# Patient Record
Sex: Female | Born: 1960
Health system: Southern US, Community
[De-identification: ages and names within clinical notes are randomized; demographics above are authoritative.]

## PROBLEM LIST (undated history)

## (undated) DIAGNOSIS — E559 Vitamin D deficiency, unspecified: Secondary | ICD-10-CM

## (undated) DIAGNOSIS — K649 Unspecified hemorrhoids: Secondary | ICD-10-CM

## (undated) DIAGNOSIS — M722 Plantar fascial fibromatosis: Secondary | ICD-10-CM

## (undated) DIAGNOSIS — L719 Rosacea, unspecified: Secondary | ICD-10-CM

## (undated) DIAGNOSIS — F329 Major depressive disorder, single episode, unspecified: Secondary | ICD-10-CM

## (undated) DIAGNOSIS — I77819 Aortic ectasia, unspecified site: Secondary | ICD-10-CM

## (undated) DIAGNOSIS — Z8719 Personal history of other diseases of the digestive system: Secondary | ICD-10-CM

## (undated) DIAGNOSIS — D6851 Activated protein C resistance: Principal | ICD-10-CM

## (undated) DIAGNOSIS — K760 Fatty (change of) liver, not elsewhere classified: Secondary | ICD-10-CM

## (undated) DIAGNOSIS — R51 Headache: Secondary | ICD-10-CM

## (undated) DIAGNOSIS — IMO0002 Reserved for concepts with insufficient information to code with codable children: Secondary | ICD-10-CM

## (undated) DIAGNOSIS — T8859XA Other complications of anesthesia, initial encounter: Secondary | ICD-10-CM

## (undated) DIAGNOSIS — K219 Gastro-esophageal reflux disease without esophagitis: Secondary | ICD-10-CM

## (undated) DIAGNOSIS — R519 Headache, unspecified: Secondary | ICD-10-CM

## (undated) DIAGNOSIS — Z923 Personal history of irradiation: Secondary | ICD-10-CM

## (undated) DIAGNOSIS — Z9889 Other specified postprocedural states: Secondary | ICD-10-CM

## (undated) DIAGNOSIS — F32A Depression, unspecified: Secondary | ICD-10-CM

## (undated) DIAGNOSIS — M8580 Other specified disorders of bone density and structure, unspecified site: Secondary | ICD-10-CM

## (undated) DIAGNOSIS — G4730 Sleep apnea, unspecified: Secondary | ICD-10-CM

## (undated) DIAGNOSIS — R5383 Other fatigue: Secondary | ICD-10-CM

## (undated) DIAGNOSIS — F419 Anxiety disorder, unspecified: Secondary | ICD-10-CM

## (undated) DIAGNOSIS — R112 Nausea with vomiting, unspecified: Secondary | ICD-10-CM

## (undated) DIAGNOSIS — I10 Essential (primary) hypertension: Secondary | ICD-10-CM

## (undated) DIAGNOSIS — C50919 Malignant neoplasm of unspecified site of unspecified female breast: Secondary | ICD-10-CM

## (undated) DIAGNOSIS — K5900 Constipation, unspecified: Secondary | ICD-10-CM

## (undated) DIAGNOSIS — E039 Hypothyroidism, unspecified: Secondary | ICD-10-CM

## (undated) DIAGNOSIS — T4145XA Adverse effect of unspecified anesthetic, initial encounter: Secondary | ICD-10-CM

## (undated) HISTORY — DX: Vitamin D deficiency, unspecified: E55.9

## (undated) HISTORY — PX: DIAGNOSTIC LAPAROSCOPY: SUR761

## (undated) HISTORY — DX: Rosacea, unspecified: L71.9

## (undated) HISTORY — DX: Fatty (change of) liver, not elsewhere classified: K76.0

## (undated) HISTORY — DX: Unspecified hemorrhoids: K64.9

## (undated) HISTORY — DX: Plantar fascial fibromatosis: M72.2

## (undated) HISTORY — PX: GANGLION CYST EXCISION: SHX1691

## (undated) HISTORY — DX: Gastro-esophageal reflux disease without esophagitis: K21.9

## (undated) HISTORY — DX: Reserved for concepts with insufficient information to code with codable children: IMO0002

## (undated) HISTORY — DX: Depression, unspecified: F32.A

## (undated) HISTORY — DX: Aortic ectasia, unspecified site: I77.819

## (undated) HISTORY — PX: INGUINAL HERNIA REPAIR: SHX194

## (undated) HISTORY — DX: Anxiety disorder, unspecified: F41.9

## (undated) HISTORY — DX: Other fatigue: R53.83

## (undated) HISTORY — DX: Sleep apnea, unspecified: G47.30

## (undated) HISTORY — DX: Constipation, unspecified: K59.00

## (undated) HISTORY — DX: Other specified disorders of bone density and structure, unspecified site: M85.80

## (undated) HISTORY — DX: Activated protein C resistance: D68.51

---
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
## 1988-11-21 HISTORY — PX: SINUS EXPLORATION: SHX5214

## 2001-10-03 ENCOUNTER — Encounter: Admission: RE | Admit: 2001-10-03 | Discharge: 2001-10-03 | Payer: Self-pay | Admitting: General Surgery

## 2001-10-03 ENCOUNTER — Encounter: Payer: Self-pay | Admitting: General Surgery

## 2002-02-15 ENCOUNTER — Encounter: Admission: RE | Admit: 2002-02-15 | Discharge: 2002-02-15 | Payer: Self-pay | Admitting: *Deleted

## 2002-02-15 ENCOUNTER — Encounter: Payer: Self-pay | Admitting: *Deleted

## 2002-08-19 ENCOUNTER — Encounter: Payer: Self-pay | Admitting: Family Medicine

## 2002-08-19 ENCOUNTER — Encounter: Admission: RE | Admit: 2002-08-19 | Discharge: 2002-08-19 | Payer: Self-pay | Admitting: Family Medicine

## 2002-08-28 ENCOUNTER — Encounter: Admission: RE | Admit: 2002-08-28 | Discharge: 2002-08-28 | Payer: Self-pay | Admitting: Family Medicine

## 2002-08-28 ENCOUNTER — Encounter: Payer: Self-pay | Admitting: Family Medicine

## 2002-09-24 ENCOUNTER — Other Ambulatory Visit: Admission: RE | Admit: 2002-09-24 | Discharge: 2002-09-24 | Payer: Self-pay | Admitting: Obstetrics and Gynecology

## 2003-01-10 ENCOUNTER — Encounter: Admission: RE | Admit: 2003-01-10 | Discharge: 2003-01-10 | Payer: Self-pay | Admitting: Family Medicine

## 2003-01-10 ENCOUNTER — Encounter: Payer: Self-pay | Admitting: Family Medicine

## 2003-09-18 ENCOUNTER — Encounter: Admission: RE | Admit: 2003-09-18 | Discharge: 2003-09-18 | Payer: Self-pay | Admitting: Obstetrics and Gynecology

## 2003-10-31 ENCOUNTER — Other Ambulatory Visit: Admission: RE | Admit: 2003-10-31 | Discharge: 2003-10-31 | Payer: Self-pay | Admitting: Obstetrics and Gynecology

## 2004-05-13 ENCOUNTER — Ambulatory Visit (HOSPITAL_COMMUNITY): Admission: RE | Admit: 2004-05-13 | Discharge: 2004-05-13 | Payer: Self-pay | Admitting: Gastroenterology

## 2004-08-29 ENCOUNTER — Emergency Department (HOSPITAL_COMMUNITY): Admission: EM | Admit: 2004-08-29 | Discharge: 2004-08-29 | Payer: Self-pay | Admitting: Family Medicine

## 2004-09-22 ENCOUNTER — Encounter: Admission: RE | Admit: 2004-09-22 | Discharge: 2004-09-22 | Payer: Self-pay | Admitting: Obstetrics and Gynecology

## 2004-09-29 ENCOUNTER — Encounter: Admission: RE | Admit: 2004-09-29 | Discharge: 2004-09-29 | Payer: Self-pay | Admitting: Obstetrics and Gynecology

## 2004-12-27 ENCOUNTER — Ambulatory Visit: Payer: Self-pay | Admitting: Internal Medicine

## 2005-01-14 ENCOUNTER — Ambulatory Visit: Payer: Self-pay

## 2005-11-23 ENCOUNTER — Encounter: Admission: RE | Admit: 2005-11-23 | Discharge: 2005-11-23 | Payer: Self-pay | Admitting: Obstetrics and Gynecology

## 2005-12-30 ENCOUNTER — Encounter: Admission: RE | Admit: 2005-12-30 | Discharge: 2005-12-30 | Payer: Self-pay | Admitting: Family Medicine

## 2006-03-10 ENCOUNTER — Ambulatory Visit: Payer: Self-pay | Admitting: Oncology

## 2006-05-01 ENCOUNTER — Ambulatory Visit: Payer: Self-pay | Admitting: Oncology

## 2006-05-02 LAB — BASIC METABOLIC PANEL - CANCER CENTER ONLY
BUN, Bld: 12 mg/dL (ref 7–22)
Calcium: 9.6 mg/dL (ref 8.0–10.3)
Glucose, Bld: 99 mg/dL (ref 73–118)

## 2006-08-27 ENCOUNTER — Encounter: Admission: RE | Admit: 2006-08-27 | Discharge: 2006-08-27 | Payer: Self-pay | Admitting: Family Medicine

## 2006-11-24 ENCOUNTER — Encounter: Admission: RE | Admit: 2006-11-24 | Discharge: 2006-11-24 | Payer: Self-pay | Admitting: Obstetrics and Gynecology

## 2007-11-02 ENCOUNTER — Emergency Department: Payer: Self-pay | Admitting: Emergency Medicine

## 2007-12-10 ENCOUNTER — Encounter: Admission: RE | Admit: 2007-12-10 | Discharge: 2007-12-10 | Payer: Self-pay | Admitting: Obstetrics and Gynecology

## 2008-03-26 ENCOUNTER — Encounter: Admission: RE | Admit: 2008-03-26 | Discharge: 2008-03-26 | Payer: Self-pay | Admitting: Gastroenterology

## 2008-12-10 ENCOUNTER — Encounter: Admission: RE | Admit: 2008-12-10 | Discharge: 2008-12-10 | Payer: Self-pay | Admitting: Obstetrics and Gynecology

## 2008-12-15 ENCOUNTER — Encounter: Admission: RE | Admit: 2008-12-15 | Discharge: 2008-12-15 | Payer: Self-pay | Admitting: Obstetrics and Gynecology

## 2009-06-17 ENCOUNTER — Encounter: Admission: RE | Admit: 2009-06-17 | Discharge: 2009-06-17 | Payer: Self-pay | Admitting: Obstetrics and Gynecology

## 2009-11-12 ENCOUNTER — Encounter: Admission: RE | Admit: 2009-11-12 | Discharge: 2009-11-12 | Payer: Self-pay | Admitting: Obstetrics and Gynecology

## 2009-12-15 ENCOUNTER — Encounter: Admission: RE | Admit: 2009-12-15 | Discharge: 2009-12-15 | Payer: Self-pay | Admitting: Obstetrics and Gynecology

## 2010-06-14 ENCOUNTER — Encounter: Admission: RE | Admit: 2010-06-14 | Discharge: 2010-06-14 | Payer: Self-pay | Admitting: Obstetrics and Gynecology

## 2010-12-12 ENCOUNTER — Encounter: Payer: Self-pay | Admitting: Obstetrics and Gynecology

## 2010-12-16 ENCOUNTER — Encounter
Admission: RE | Admit: 2010-12-16 | Discharge: 2010-12-16 | Payer: Self-pay | Source: Home / Self Care | Attending: Obstetrics and Gynecology | Admitting: Obstetrics and Gynecology

## 2011-04-04 ENCOUNTER — Other Ambulatory Visit: Payer: Self-pay | Admitting: Obstetrics and Gynecology

## 2011-04-04 ENCOUNTER — Ambulatory Visit
Admission: RE | Admit: 2011-04-04 | Discharge: 2011-04-04 | Disposition: A | Discharge status: Home / Self Care | Payer: BC Managed Care – PPO | Source: Ambulatory Visit | Attending: Obstetrics and Gynecology | Admitting: Obstetrics and Gynecology

## 2011-04-04 DIAGNOSIS — R1031 Right lower quadrant pain: Secondary | ICD-10-CM

## 2011-04-15 ENCOUNTER — Ambulatory Visit (HOSPITAL_COMMUNITY)
Admission: RE | Admit: 2011-04-15 | Discharge: 2011-04-15 | Disposition: A | Discharge status: Home / Self Care | Payer: BC Managed Care – PPO | Source: Ambulatory Visit | Attending: Urology | Admitting: Urology

## 2011-04-15 ENCOUNTER — Ambulatory Visit (HOSPITAL_BASED_OUTPATIENT_CLINIC_OR_DEPARTMENT_OTHER)
Admission: RE | Admit: 2011-04-15 | Discharge: 2011-04-15 | Disposition: A | Discharge status: Home / Self Care | Payer: BC Managed Care – PPO | Source: Ambulatory Visit | Attending: Urology | Admitting: Urology

## 2011-04-15 DIAGNOSIS — Z01812 Encounter for preprocedural laboratory examination: Secondary | ICD-10-CM | POA: Insufficient documentation

## 2011-04-15 DIAGNOSIS — N201 Calculus of ureter: Secondary | ICD-10-CM | POA: Insufficient documentation

## 2011-04-15 DIAGNOSIS — N200 Calculus of kidney: Secondary | ICD-10-CM | POA: Insufficient documentation

## 2011-04-15 DIAGNOSIS — D6859 Other primary thrombophilia: Secondary | ICD-10-CM | POA: Insufficient documentation

## 2011-04-15 LAB — CBC
Hemoglobin: 13.7 g/dL (ref 12.0–15.0)
MCHC: 33.7 g/dL (ref 30.0–36.0)
MCV: 90.8 fL (ref 78.0–100.0)
RBC: 4.47 MIL/uL (ref 3.87–5.11)
RDW: 12.3 % (ref 11.5–15.5)
WBC: 6.8 10*3/uL (ref 4.0–10.5)

## 2011-04-22 NOTE — Op Note (Signed)
  NAMECHARMAYNE, Yvonne Wyatt               ACCOUNT NO.:  [id number]  MEDICAL RECORD NO.:  [id number]           PATIENT TYPE:  O  LOCATION:  XRAY                         FACILITY:  [facility]  PHYSICIAN:  Royalty Domagala I. Patsi Sears, M.D.DATE OF BIRTH:  1961/02/27  DATE OF PROCEDURE:  04/15/2011 DATE OF DISCHARGE:                              OPERATIVE REPORT   PREOPERATIVE DIAGNOSIS:  Impacted left lower ureteral calculus.  POSTOPERATIVE DIAGNOSIS:  Impacted left lower ureteral calculus.  OPERATION:  Cystourethroscopy, left retrograde pyelogram with interpretation, basket extraction of left lower ureteral calculus, left double-J stent (4.8 x 24 cm).  SURGEON:  Kailene Steinhart I. Patsi Sears, MD  ANESTHESIA:  General LMA.  PREOPERATIVE PREPARATION:  After appropriate preanesthesia, the patient was brought to the operating room, placed on the operating table in dorsal supine position where general LMA anesthesia was introduced.  B and O suppository was placed.  She was re-placed in dorsal lithotomy position where the pubis was prepped with Betadine solution and draped in usual fashion.  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  She has factor V Leiden abnormality.  The patient is now for extraction of her stone.  PROCEDURE IN DETAIL:  Cystourethroscopy was accomplished, left retrograde pyelogram was accomplished, and the stone was identified in left lower ureter.  Magnified images of the stone showed that the stone was rectangular in shape, measuring greater than 1 cm in length, x 5 mm in width.  The stone was also speculated.  I was able to place a 4-wire basket around stone through the ureteroscope, was able to engage the entire stone, and manipulate the stone through the edematous wall of the left ureter.  Minimal bleeding was noted.  However, because of the  edema of the wall secondary to impacted stone, I elected to place a left double-J stent.  Therefore, over the guidewire, a 4.8 x 24 cm left double-J stent was placed, with the suture left in place, for the patient to remove herself.  She was given IV Toradol, awakened, and taken to recovery room in excellent condition.     Kenzie Flakes I. Patsi Sears, M.D.     SIT/MEDQ  D:  04/15/2011  T:  04/16/2011  Job:  [id number]  cc:   Maxie Better, M.D. Fax: [phone]  Kari Baars, M.D. Fax: [phone]  Electronically Signed by Jethro Bolus M.D. on 04/22/2011 01:23:53 PM

## 2011-11-01 ENCOUNTER — Other Ambulatory Visit: Payer: Self-pay | Admitting: Obstetrics and Gynecology

## 2011-11-01 DIAGNOSIS — Z1231 Encounter for screening mammogram for malignant neoplasm of breast: Secondary | ICD-10-CM

## 2011-12-19 ENCOUNTER — Ambulatory Visit
Admission: RE | Admit: 2011-12-19 | Discharge: 2011-12-19 | Disposition: A | Discharge status: Home / Self Care | Payer: BC Managed Care – PPO | Source: Ambulatory Visit | Attending: Obstetrics and Gynecology | Admitting: Obstetrics and Gynecology

## 2011-12-19 DIAGNOSIS — Z1231 Encounter for screening mammogram for malignant neoplasm of breast: Secondary | ICD-10-CM

## 2012-11-16 ENCOUNTER — Other Ambulatory Visit: Payer: Self-pay | Admitting: Obstetrics and Gynecology

## 2012-11-16 DIAGNOSIS — Z1231 Encounter for screening mammogram for malignant neoplasm of breast: Secondary | ICD-10-CM

## 2012-12-19 ENCOUNTER — Ambulatory Visit: Payer: BC Managed Care – PPO

## 2013-01-04 ENCOUNTER — Ambulatory Visit: Payer: BC Managed Care – PPO

## 2013-01-17 ENCOUNTER — Ambulatory Visit: Payer: BC Managed Care – PPO

## 2013-02-08 ENCOUNTER — Ambulatory Visit
Admission: RE | Admit: 2013-02-08 | Discharge: 2013-02-08 | Disposition: A | Discharge status: Home / Self Care | Payer: BC Managed Care – PPO | Source: Ambulatory Visit | Attending: Obstetrics and Gynecology | Admitting: Obstetrics and Gynecology

## 2013-02-08 DIAGNOSIS — Z1231 Encounter for screening mammogram for malignant neoplasm of breast: Secondary | ICD-10-CM

## 2013-02-13 ENCOUNTER — Other Ambulatory Visit: Payer: Self-pay | Admitting: Obstetrics and Gynecology

## 2013-02-13 DIAGNOSIS — R928 Other abnormal and inconclusive findings on diagnostic imaging of breast: Secondary | ICD-10-CM

## 2013-02-19 ENCOUNTER — Ambulatory Visit
Admission: RE | Admit: 2013-02-19 | Discharge: 2013-02-19 | Disposition: A | Discharge status: Home / Self Care | Payer: BC Managed Care – PPO | Source: Ambulatory Visit | Attending: Obstetrics and Gynecology | Admitting: Obstetrics and Gynecology

## 2013-02-19 DIAGNOSIS — R928 Other abnormal and inconclusive findings on diagnostic imaging of breast: Secondary | ICD-10-CM

## 2013-07-25 ENCOUNTER — Other Ambulatory Visit: Payer: Self-pay | Admitting: Obstetrics and Gynecology

## 2013-07-25 DIAGNOSIS — N6001 Solitary cyst of right breast: Secondary | ICD-10-CM

## 2013-08-28 ENCOUNTER — Other Ambulatory Visit: Payer: BC Managed Care – PPO

## 2013-09-05 ENCOUNTER — Ambulatory Visit
Admission: RE | Admit: 2013-09-05 | Discharge: 2013-09-05 | Disposition: A | Discharge status: Home / Self Care | Payer: BC Managed Care – PPO | Source: Ambulatory Visit | Attending: Obstetrics and Gynecology | Admitting: Obstetrics and Gynecology

## 2013-09-05 DIAGNOSIS — N6001 Solitary cyst of right breast: Secondary | ICD-10-CM

## 2014-01-28 ENCOUNTER — Other Ambulatory Visit: Payer: Self-pay | Admitting: Obstetrics and Gynecology

## 2014-01-28 DIAGNOSIS — N6310 Unspecified lump in the right breast, unspecified quadrant: Secondary | ICD-10-CM

## 2014-02-24 ENCOUNTER — Ambulatory Visit
Admission: RE | Admit: 2014-02-24 | Discharge: 2014-02-24 | Disposition: A | Discharge status: Home / Self Care | Payer: Self-pay | Source: Ambulatory Visit | Attending: Obstetrics and Gynecology | Admitting: Obstetrics and Gynecology

## 2014-02-24 ENCOUNTER — Ambulatory Visit
Admission: RE | Admit: 2014-02-24 | Discharge: 2014-02-24 | Disposition: A | Discharge status: Home / Self Care | Payer: BC Managed Care – PPO | Source: Ambulatory Visit | Attending: Obstetrics and Gynecology | Admitting: Obstetrics and Gynecology

## 2014-02-24 DIAGNOSIS — N6310 Unspecified lump in the right breast, unspecified quadrant: Secondary | ICD-10-CM

## 2014-03-31 ENCOUNTER — Other Ambulatory Visit: Payer: Self-pay | Admitting: Obstetrics and Gynecology

## 2014-03-31 DIAGNOSIS — Z780 Asymptomatic menopausal state: Secondary | ICD-10-CM

## 2014-04-21 ENCOUNTER — Ambulatory Visit
Admission: RE | Admit: 2014-04-21 | Discharge: 2014-04-21 | Disposition: A | Discharge status: Home / Self Care | Payer: BC Managed Care – PPO | Source: Ambulatory Visit | Attending: Obstetrics and Gynecology | Admitting: Obstetrics and Gynecology

## 2014-04-21 ENCOUNTER — Encounter (INDEPENDENT_AMBULATORY_CARE_PROVIDER_SITE_OTHER): Payer: Self-pay

## 2014-04-21 DIAGNOSIS — Z780 Asymptomatic menopausal state: Secondary | ICD-10-CM

## 2014-05-20 ENCOUNTER — Ambulatory Visit (INDEPENDENT_AMBULATORY_CARE_PROVIDER_SITE_OTHER): Payer: BC Managed Care – PPO | Admitting: Oncology

## 2014-05-20 ENCOUNTER — Encounter: Payer: Self-pay | Admitting: Oncology

## 2014-05-20 VITALS — BP 131/83 | HR 93 | Temp 97.8°F | Ht 66.0 in | Wt 182.5 lb

## 2014-05-20 DIAGNOSIS — IMO0002 Reserved for concepts with insufficient information to code with codable children: Secondary | ICD-10-CM

## 2014-05-20 DIAGNOSIS — D6859 Other primary thrombophilia: Secondary | ICD-10-CM

## 2014-05-20 DIAGNOSIS — D6851 Activated protein C resistance: Secondary | ICD-10-CM

## 2014-05-20 HISTORY — DX: Reserved for concepts with insufficient information to code with codable children: IMO0002

## 2014-05-20 HISTORY — DX: Activated protein C resistance: D68.51

## 2014-05-20 NOTE — Progress Notes (Signed)
Patient ID: Yvonne Wyatt, female   DOB: January 31, 1961, 53 y.o.   MRN: [id number] New Patient Hematology   Yvonne Wyatt [id number] 1961/05/24 53 y.o. 05/20/2014  CC: Dr. 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 She was found to be a heterozygote for the factor 5 Leiden gene mutation in 2007. She has no personal history of blood clots. She had been on estrogen containing oral contraceptives with 35 mcg of estrogen for many years to control symptoms of endometriosis. She's had a number of surgeries including tonsillectomy at age 53, laparotomy in 1981, C-section in 1988, a number of laser endometriosis lysis of adhesions procedure in 1991 and 1993, and surgery on her right wrist to excise multiple cysts. She's never had any perioperative complications. She had an uncomplicated pregnancy. Her son who is her only child, is 9 years old. He has not had any clotting problems. Her mother who was a heavy smoker and was also on oral contraceptives had a venous thrombosis in her leg at approximate age 79. Her father died of complications of obstructive airway disease. Brother died of complications of a stroke at age 46 but had poorly controlled diabetes and hypertension. He tested negative for the factor V Leiden gene defect. A sister age 46 and another sister age 36 had tested negative as well.  She went into an early menopause at age 53. She has developed significant atrophy of the vaginal tissues and has significant dyspareunia such that she has not had intercourse in many years. She was told by a mother hematologist that she should not take any estrogen preparations.   PMH: Endometriosis. No hypertension, diabetes, ulcers, she does have reflux, no thyroid disease, no history of hepatitis, yellow jaundice, she  had mononucleosis in high school, if I recall correctly Past surgery: See history of present illness  Allergies: Allergies  Allergen Reactions  . Codeine Rash    Medications: Scheduled Meds: None  PRN Meds:.Not recorded    Social History: Married, one healthy son age 32, works as a Marine scientist in a [employer]  reports that she has never smoked. Occasional alcohol, social.   Family History: See history of present illness  Review of Systems: Unremarkable. No dyspnea, chest pain, chest pressure, leg swelling or tenderness.  Physical Exam: Blood pressure 131/83, pulse 93, temperature 97.8 F (36.6 C), temperature source Oral, height 5\' 6"  (1.676 m), weight 182 lb 8 oz (82.781 kg), SpO2 98.00%. Wt Readings from Last 3 Encounters:  05/20/14 182 lb 8 oz (82.781 kg)     General appearance: Well-nourished Caucasian woman HENNT: Pharynx no erythema, exudate, mass, or ulcer. No thyromegaly or thyroid nodules Lymph nodes: No cervical, supraclavicular, or axillary lymphadenopathy Breasts: Lungs: Clear to auscultation, resonant to percussion throughout Heart: Regular rhythm, no murmur, no gallop, no rub, no click, no edema Abdomen: Soft, nontender, normal bowel sounds, no mass, no organomegaly Extremities: No edema, no calf tenderness. Right calf 42 cm, left 42.5. Right ankle 22 cm, left 22.5. Musculoskeletal: no joint deformities GU:  Vascular: Carotid pulses 2+, no bruits, distal pulses: Dorsalis pedis 1+ symmetric Neurologic: Alert, oriented, PERRLA, optic discs sharp and vessels normal, no hemorrhage or exudate, cranial nerves grossly normal, motor strength 5 over 5, reflexes 1+ symmetric, upper body coordination normal, gait normal, Skin: No rash or ecchymosis  Lab Results: Lab Results  Component Value Date   WBC 6.8 04/15/2011   HGB 13.7 04/15/2011   HCT 40.6 04/15/2011   MCV 90.8 04/15/2011   PLT 253 04/15/2011     Chemistry      Component Value Date/Time   NA 140  05/02/2006 1027   K 4.0 05/02/2006 1027   CL 104 05/02/2006 1027   CO2 22 05/02/2006 1027   BUN 12 05/02/2006 1027   CREATININE 0.9 05/02/2006 1027      Component Value Date/Time   CALCIUM 9.6 05/02/2006 1027       Impression and Plan: Heterozygote status for factor V Leiden gene mutation  She has had a number of physiologic stresses on her clotting system including previous prolonged use of estrogen containing oral contraceptives, pregnancy, and multiple surgical procedures, and has never had a thrombotic event. Family history positive for only a single person, her mother, having a lower extremity DVT while on oral contraceptives at age 42 with additional risk factor of heavy cigarette smoking. Her brother's event was arterial, and unrelated to a congenital coagulopathy and therefore not a risk factor for Yvonne Wyatt.  I think that Yvonne Wyatt would be at very low risk for thrombosis if she were to use low doses of estrogen. Given the prevalence of this gene mutation in the general population, there are many women who have the mutation who are taking estrogen containing medication and who are not having thrombotic events. She, clearly,was one of them. If she can control her local problems with estrogen vaginal cream alone this would be my first choice. A Mirena or Skylar vaginal ring would pose minimal to 0 thrombotic risk but I am not sure whether the progesterone only effect could reverse her vaginal atrophy. A combination of topical vaginal estrogen cream and oral low dose progesterone (for example Megace 20 mg twice a day) could control her local problem as well as hot flashes. If none of the above options worked, I would not have any objection to her being on a low-dose estrogen/progesterone containing pill such as Loestrin which only has 10 mcg of estrogen in combination with norethindrone.        Annia Belt, MD 05/20/2014, 4:21 PM

## 2014-05-20 NOTE — Patient Instructions (Signed)
OK to start with a topical vaginal estrogen containing cream Can supplement with low dose megace 20 mg twice daily for severe hot flashes I feel a combo pill with 10 micrograms of estrogen plus low dose progesterone would be acceptable and have low clotting risk

## 2015-01-27 ENCOUNTER — Other Ambulatory Visit: Payer: Self-pay | Admitting: Obstetrics and Gynecology

## 2015-01-27 DIAGNOSIS — N63 Unspecified lump in unspecified breast: Secondary | ICD-10-CM

## 2015-02-26 ENCOUNTER — Other Ambulatory Visit: Payer: Self-pay

## 2015-03-04 ENCOUNTER — Other Ambulatory Visit: Payer: Self-pay | Admitting: Obstetrics and Gynecology

## 2015-03-04 ENCOUNTER — Ambulatory Visit
Admission: RE | Admit: 2015-03-04 | Discharge: 2015-03-04 | Disposition: A | Discharge status: Home / Self Care | Payer: BLUE CROSS/BLUE SHIELD | Source: Ambulatory Visit | Attending: Obstetrics and Gynecology | Admitting: Obstetrics and Gynecology

## 2015-03-04 DIAGNOSIS — N63 Unspecified lump in unspecified breast: Secondary | ICD-10-CM

## 2016-01-01 ENCOUNTER — Other Ambulatory Visit: Payer: Self-pay

## 2016-01-01 DIAGNOSIS — Z1231 Encounter for screening mammogram for malignant neoplasm of breast: Secondary | ICD-10-CM

## 2016-03-07 ENCOUNTER — Other Ambulatory Visit: Payer: Self-pay | Admitting: Obstetrics and Gynecology

## 2016-03-07 DIAGNOSIS — M8580 Other specified disorders of bone density and structure, unspecified site: Secondary | ICD-10-CM

## 2016-03-08 ENCOUNTER — Ambulatory Visit
Admission: RE | Admit: 2016-03-08 | Discharge: 2016-03-08 | Disposition: A | Discharge status: Home / Self Care | Payer: 59 | Source: Ambulatory Visit

## 2016-03-08 DIAGNOSIS — Z1231 Encounter for screening mammogram for malignant neoplasm of breast: Secondary | ICD-10-CM

## 2016-03-17 ENCOUNTER — Other Ambulatory Visit: Payer: Self-pay | Admitting: Gastroenterology

## 2016-03-17 DIAGNOSIS — R1013 Epigastric pain: Secondary | ICD-10-CM

## 2016-03-17 DIAGNOSIS — R109 Unspecified abdominal pain: Secondary | ICD-10-CM

## 2016-03-25 ENCOUNTER — Other Ambulatory Visit: Payer: 59

## 2016-03-30 ENCOUNTER — Ambulatory Visit
Admission: RE | Admit: 2016-03-30 | Discharge: 2016-03-30 | Disposition: A | Discharge status: Home / Self Care | Payer: 59 | Source: Ambulatory Visit | Attending: Gastroenterology | Admitting: Gastroenterology

## 2016-03-30 DIAGNOSIS — R1013 Epigastric pain: Secondary | ICD-10-CM

## 2016-03-30 DIAGNOSIS — R109 Unspecified abdominal pain: Secondary | ICD-10-CM

## 2016-04-25 ENCOUNTER — Ambulatory Visit
Admission: RE | Admit: 2016-04-25 | Discharge: 2016-04-25 | Disposition: A | Discharge status: Home / Self Care | Payer: 59 | Source: Ambulatory Visit | Attending: Obstetrics and Gynecology | Admitting: Obstetrics and Gynecology

## 2016-04-25 DIAGNOSIS — M8580 Other specified disorders of bone density and structure, unspecified site: Secondary | ICD-10-CM

## 2016-11-24 DIAGNOSIS — Z719 Counseling, unspecified: Secondary | ICD-10-CM | POA: Diagnosis not present

## 2016-12-01 DIAGNOSIS — Z719 Counseling, unspecified: Secondary | ICD-10-CM | POA: Diagnosis not present

## 2016-12-08 DIAGNOSIS — Z719 Counseling, unspecified: Secondary | ICD-10-CM | POA: Diagnosis not present

## 2016-12-15 DIAGNOSIS — Z719 Counseling, unspecified: Secondary | ICD-10-CM | POA: Diagnosis not present

## 2017-01-05 DIAGNOSIS — Z719 Counseling, unspecified: Secondary | ICD-10-CM | POA: Diagnosis not present

## 2017-01-09 ENCOUNTER — Other Ambulatory Visit: Payer: Self-pay | Admitting: Obstetrics and Gynecology

## 2017-01-09 DIAGNOSIS — Z1231 Encounter for screening mammogram for malignant neoplasm of breast: Secondary | ICD-10-CM

## 2017-03-10 ENCOUNTER — Ambulatory Visit
Admission: RE | Admit: 2017-03-10 | Discharge: 2017-03-10 | Disposition: A | Discharge status: Home / Self Care | Payer: 59 | Source: Ambulatory Visit | Attending: Obstetrics and Gynecology | Admitting: Obstetrics and Gynecology

## 2017-03-10 DIAGNOSIS — Z1231 Encounter for screening mammogram for malignant neoplasm of breast: Secondary | ICD-10-CM

## 2017-04-19 DIAGNOSIS — Z01419 Encounter for gynecological examination (general) (routine) without abnormal findings: Secondary | ICD-10-CM | POA: Diagnosis not present

## 2017-07-18 ENCOUNTER — Encounter: Payer: Self-pay | Admitting: Podiatry

## 2017-07-18 ENCOUNTER — Ambulatory Visit (INDEPENDENT_AMBULATORY_CARE_PROVIDER_SITE_OTHER): Payer: 59 | Admitting: Podiatry

## 2017-07-18 ENCOUNTER — Ambulatory Visit (INDEPENDENT_AMBULATORY_CARE_PROVIDER_SITE_OTHER): Payer: 59

## 2017-07-18 ENCOUNTER — Ambulatory Visit: Payer: 59

## 2017-07-18 VITALS — BP 137/85 | HR 70

## 2017-07-18 DIAGNOSIS — R52 Pain, unspecified: Secondary | ICD-10-CM

## 2017-07-18 DIAGNOSIS — M2580 Other specified joint disorders, unspecified joint: Secondary | ICD-10-CM

## 2017-07-18 NOTE — Progress Notes (Signed)
   Subjective:    Patient ID: Yvonne Wyatt, female    DOB: Sep 25, 1961, 56 y.o.   MRN: [id number]  HPI This patient presents today with several concerns. Her primary concern is pain in the plantar right first MPJ for the past or weeks aggravated with standing and walking relieved with rest and elevation. Patient has prior history of sesamoiditis. She wears athletic style shoes which reduces some of the discomfort in this area Patient also complaining of bilateral arch pain for the past 2 months aggravated with standing walking relieved with rest and self treated with athletic style shoes. 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  Musculoskeletal: Positive for back pain.  Neurological: Positive for headaches.       Objective:   Physical Exam  Patient has some issues approximated 5 foot 6 210 pounds  Objective: Orientated 3 DP and PT pulses 2/4 bilaterally Reflexes within normal limits bilaterally  Neurological: Sensation to 10 g monofilament wire intact 5/5 bilaterally Vibratory sensation reactive bilaterally Ankle reflexes reactive bilaterally  Dermatological No skin lesions bilaterally Texture and turgor within normal limits bilaterally  Musculoskeletal: Palpable tenderness plantar medial sesamoid first MPJ right. This duplicates patient's discomfort. There is no tenderness on range of motion first MPJ right or first MPJ left Palpable tenderness it section medial plantar fascial bilaterally without any palpable lesions  X-ray examination weightbearing right foot dated 07/18/2017 Intact bony structures fracture and/or dislocation On one view margin of the medial sesamoid at decreased density. This was not noticed any other eview  Radiographic impression: Probable sesamoiditis medial sesamoid right first MPJ  X-ray examination weightbearing left foot dated  07/18/2017  Intact bony structures without fracture and/or dislocation  Radiographic impression: No acute bony abnormality noted in the x-ray of the left foot      Assessment & Plan:   Assessment: Sesamoiditis medial sesamoid plantar right first MPJ Fasciitis bilaterally  Plan: Today I reviewed the results exam and x-rays with patient. I offered her Kenalog Injection for the sesamoiditis and she verbally consents. Skin is prepped with alcohol and Betadine and 10 mg of Kenalog mixed with 10 mg of plain Xylocaine and 2.5 mg of plain Sensorcaine were injected in or around the plantar medial sesamoid on the right foot. Patient tolerated procedure without any difficulty Surgical felt pads dispensed with cut outs 3 for patient to place inside athletic style shoes to offload the plantar right first MPJ Discussed stretching and shoeing for plantar fasciitis  Patient will return if the symptoms do not improve in the next 6-8 weeks. If patient would return would re-x-ray right foot to further evaluate medial sesamoid 12 outside possibility of sesamoid fracture. Also discussed the use of prescription foot orthotics   Reappoint at patient's request

## 2017-07-18 NOTE — Patient Instructions (Signed)
Attach pad to the insole as we discussed If the pain does not continue to improve over the next 6-8 weeks return for further evaluation Sesamoid Injury A sesamoid injury happens when a sesamoid bone or a surrounding tendon gets damaged during activity. A sesamoid bone is a bone that is connected to a tendon or muscle but not to a joint. Your kneecap is an example of a sesamoid bone. Examples of sesamoid injuries include irritation, dislocation, or a crack or break (fracture) in a sesamoid bone. Most sesamoid injuries affect the sesamoid bones under the big toe. These bones help you to move forward during weight-bearing activities. What are the causes? This condition is caused by damage to a sesamoid bone or a surrounding tendon. What increases the risk? This condition is more likely to develop in people who:  Dance ballet.  Run.  Play sports.  Are active on artificial turf.  Wear high heels.  What are the signs or symptoms? Symptoms of this condition include:  Pain under the big toe.  Pain when you try to straighten your big toe.  A popping sound that happens at the time of injury.  Swelling.  Bruising.  How is this diagnosed? This condition is diagnosed with:  A physical exam.  Observation of your movement while you walk.  An X-ray.  Bone scans.  How is this treated? Treatment for this condition depends on the location, type, and severity of the injury. Treatment may involve:  Resting the affected area.  Applying ice to the affected area.  Taking over-the-counter pain medicine.  Placing a cushioned pad in the shoe.  Avoiding activities that are causing injury.  Taping your toe to prevent movement.  Getting steroid injections.  Wearing a cast, brace, or orthotic shoe.  Physical therapy.  Surgery.  Follow these instructions at home: If you have a cast:  Do not stick anything inside the cast to scratch your skin. Doing that increases your risk of  infection.  Check the skin around the cast every day. Report any concerns to your health care provider. You may put lotion on dry skin around the edges of the cast. Do not apply lotion to the skin underneath the cast.  Do not put pressure on any part of the cast until it is fully hardened. This may take several hours.  Do not let your cast get wet if it is not waterproof.  Keep the cast clean. If you have a brace:  Wear the brace as told by your health care provider. Remove it only as told by your health care provider.  Loosen the brace if your toes become numb and tingle, or if they turn cold and blue.  Do not let your brace get wet if it is not waterproof.  Keep the brace clean. Bathing  Do not take baths, swim, or use a hot tub until your health care provider approves. Ask your health care provider if you can take showers. You may only be allowed to take sponge baths for bathing.  If your cast or brace is not waterproof, cover it with a watertight plastic bag when you take a bath or shower. Managing pain, stiffness, and swelling  If directed, apply ice to the injured area. ? Put ice in a plastic bag. ? Place a towel between your skin and the bag. ? Leave the ice on for 20 minutes, 2-3 times per day.  Move your toes often to avoid stiffness and to lessen swelling.  Raise (elevate)  the injured area above the level of your heart while you are sitting or lying down. Driving  Do not drive or operate heavy machinery while taking certain prescription pain medicines.  Ask your health care provider when it is safe to drive if you have a cast, brace, or orthotic shoe on a foot that you use for driving. Activity  Return to your normal activities as told by your health care provider. Ask your health care provider what activities are safe for you.  Do exercises as told by your health care provider or physical therapist. Safety  Do not use the injured limb to support your body  weight until your health care provider says that you can. Use crutches as told by your health care provider. General instructions  Do not use any tobacco products, including cigarettes, chewing tobacco, or e-cigarettes. Tobacco can delay bone healing. If you need help quitting, ask your health care provider.  Take over-the-counter and prescription medicines only as told by your health care provider.  Keep all follow-up visits as told by your health care provider. This is important. Contact a health care provider if:  Pain and swelling continue even with treatment.  Pain and swelling return after you get back to your normal activities.  You cannot put pressure on your foot. Get help right away if:  You lose sensation in your foot.  Your toes turn cold and blue. This information is not intended to replace advice given to you by your health care provider. Make sure you discuss any questions you have with your health care provider. Document Released: 11/07/2005 Document Revised: 10/06/2016 Document Reviewed: 05/20/2015 Elsevier Interactive Patient Education  2017 [employer].

## 2017-07-21 DIAGNOSIS — I10 Essential (primary) hypertension: Secondary | ICD-10-CM | POA: Diagnosis not present

## 2017-07-21 DIAGNOSIS — E038 Other specified hypothyroidism: Secondary | ICD-10-CM | POA: Diagnosis not present

## 2017-07-21 DIAGNOSIS — E784 Other hyperlipidemia: Secondary | ICD-10-CM | POA: Diagnosis not present

## 2017-07-26 DIAGNOSIS — H524 Presbyopia: Secondary | ICD-10-CM | POA: Diagnosis not present

## 2017-07-26 DIAGNOSIS — E119 Type 2 diabetes mellitus without complications: Secondary | ICD-10-CM | POA: Diagnosis not present

## 2017-07-31 DIAGNOSIS — E784 Other hyperlipidemia: Secondary | ICD-10-CM | POA: Diagnosis not present

## 2017-07-31 DIAGNOSIS — Z1389 Encounter for screening for other disorder: Secondary | ICD-10-CM | POA: Diagnosis not present

## 2017-07-31 DIAGNOSIS — R7301 Impaired fasting glucose: Secondary | ICD-10-CM | POA: Diagnosis not present

## 2017-07-31 DIAGNOSIS — Z0000 Encounter for general adult medical examination without abnormal findings: Secondary | ICD-10-CM | POA: Diagnosis not present

## 2017-07-31 DIAGNOSIS — I10 Essential (primary) hypertension: Secondary | ICD-10-CM | POA: Diagnosis not present

## 2017-07-31 DIAGNOSIS — Z23 Encounter for immunization: Secondary | ICD-10-CM | POA: Diagnosis not present

## 2017-08-01 ENCOUNTER — Other Ambulatory Visit: Payer: Self-pay | Admitting: Internal Medicine

## 2017-08-01 DIAGNOSIS — R740 Nonspecific elevation of levels of transaminase and lactic acid dehydrogenase [LDH]: Secondary | ICD-10-CM

## 2017-08-01 DIAGNOSIS — R1010 Upper abdominal pain, unspecified: Secondary | ICD-10-CM

## 2017-08-01 DIAGNOSIS — R7401 Elevation of levels of liver transaminase levels: Secondary | ICD-10-CM

## 2017-08-02 ENCOUNTER — Ambulatory Visit
Admission: RE | Admit: 2017-08-02 | Discharge: 2017-08-02 | Disposition: A | Discharge status: Home / Self Care | Payer: 59 | Source: Ambulatory Visit | Attending: Internal Medicine | Admitting: Internal Medicine

## 2017-08-02 DIAGNOSIS — Z1389 Encounter for screening for other disorder: Secondary | ICD-10-CM | POA: Diagnosis not present

## 2017-08-02 DIAGNOSIS — K760 Fatty (change of) liver, not elsewhere classified: Secondary | ICD-10-CM | POA: Diagnosis not present

## 2017-08-02 DIAGNOSIS — R7989 Other specified abnormal findings of blood chemistry: Secondary | ICD-10-CM | POA: Diagnosis not present

## 2017-08-02 DIAGNOSIS — Z23 Encounter for immunization: Secondary | ICD-10-CM | POA: Diagnosis not present

## 2017-08-02 DIAGNOSIS — R7401 Elevation of levels of liver transaminase levels: Secondary | ICD-10-CM

## 2017-08-02 DIAGNOSIS — R1010 Upper abdominal pain, unspecified: Secondary | ICD-10-CM

## 2017-08-02 DIAGNOSIS — R740 Nonspecific elevation of levels of transaminase and lactic acid dehydrogenase [LDH]: Secondary | ICD-10-CM

## 2017-08-11 ENCOUNTER — Other Ambulatory Visit: Payer: Self-pay | Admitting: Internal Medicine

## 2017-08-11 DIAGNOSIS — R1010 Upper abdominal pain, unspecified: Secondary | ICD-10-CM

## 2017-08-22 ENCOUNTER — Encounter: Payer: Self-pay | Admitting: Neurology

## 2017-08-22 ENCOUNTER — Ambulatory Visit
Admission: RE | Admit: 2017-08-22 | Discharge: 2017-08-22 | Disposition: A | Discharge status: Home / Self Care | Payer: 59 | Source: Ambulatory Visit | Attending: Internal Medicine | Admitting: Internal Medicine

## 2017-08-22 DIAGNOSIS — R1010 Upper abdominal pain, unspecified: Secondary | ICD-10-CM | POA: Insufficient documentation

## 2017-08-22 DIAGNOSIS — R109 Unspecified abdominal pain: Secondary | ICD-10-CM | POA: Diagnosis not present

## 2017-08-22 MED ORDER — TECHNETIUM TC 99M MEBROFENIN IV KIT
5.0000 | PACK | Freq: Once | INTRAVENOUS | Status: AC | PRN
  Administered 2017-08-22: 5.28 via INTRAVENOUS

## 2017-08-23 ENCOUNTER — Ambulatory Visit (INDEPENDENT_AMBULATORY_CARE_PROVIDER_SITE_OTHER): Payer: 59 | Admitting: Neurology

## 2017-08-23 ENCOUNTER — Encounter: Payer: Self-pay | Admitting: Neurology

## 2017-08-23 VITALS — BP 126/80 | HR 66 | Ht 66.0 in | Wt 215.0 lb

## 2017-08-23 DIAGNOSIS — D6851 Activated protein C resistance: Secondary | ICD-10-CM | POA: Diagnosis not present

## 2017-08-23 DIAGNOSIS — R7983 Abnormal findings of blood amino-acid level: Secondary | ICD-10-CM

## 2017-08-23 DIAGNOSIS — E7219 Other disorders of sulfur-bearing amino-acid metabolism: Secondary | ICD-10-CM | POA: Diagnosis not present

## 2017-08-23 DIAGNOSIS — G4719 Other hypersomnia: Secondary | ICD-10-CM | POA: Diagnosis not present

## 2017-08-23 DIAGNOSIS — R0683 Snoring: Secondary | ICD-10-CM

## 2017-08-23 NOTE — Progress Notes (Signed)
SLEEP MEDICINE CLINIC   Provider:  Larey Seat, M D  Primary Care Physician:  Marton Redwood, MD   Referring Provider: Servando Salina, MD   Chief Complaint  Patient presents with  . New Patient (Initial Visit)    pt had a sleep study with Dr Brett Fairy in 2010 and had mild apnea at that time used CPAP but stopped using it in 2011.     HPI:  Yvonne Wyatt is a 56 y.o. female , seen here as in a referral  from Dr. Garwin Brothers for a re - evaluation for possible OSA.  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 She reports poor sleep waking up with headaches, feeling sleepy during the day and having trouble staying awake even when busy. In 2010 she was evaluated for the presence of sleep apnea , only mild obstructive sleep apnea was found at the time -she was placed on CPAP but after a year discontinued it. She had lost a significant amount of weight which she recently regained and relates the return of symptoms to this weight gain.  Her caretaker duties at home have been more than before, too. She has a history of hypothyroidism, osteopenia, obesity, fatigue, postmenopausal sleep disorder, factor V Leiden, atypical chest pain, reflux disease abdominal aorticectasia endometriosis, kidney stones, sinus surgery in 1992.  Chief complaint according to patient : Sleepy, fatigued.   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 She endorses a bedtime around 10:30 PM, and is usually asleep promptly. Her bedroom is cool, quiet and dark. She shows a bedroom with her husband. She sleeps on one pillow but with an elevated head of bed to counteract gastric reflux. She describes her sleep is fragmented by spontaneous arousals she is not sure what wakes her- she has to go to the bathroom once or twice each night, too. She wakes by alarm at 5.50 AM.  Feels  as she hadn't slept. Headaches present every morning, generalized achiness . Being drowsy  Sleep medical history and family sleep history:  Has no family history of sleep apnea. History of tonsillectomy and adenoidectomy, septoplasty and sinus surgery-  Turbinate reduction - 1992. Patient has 2 living sisters. Both healthy. One son healthy age 43 . Father was alcoholic, and brother died of a CVA at age 15.   Social history: married, her mother is 91 and lives independently - needs some assistance.  Tobacco use- none- grew up with chain smoking parents. No ETOH, caffeine : soda and tea, until 4 PM.   Review of Systems: Out of a complete 14 system review, the patient complains of only the following symptoms, and all other reviewed systems are negative.  Snoring. Witnessed apnea, fatigue.   Epworth score 8-10 , Fatigue severity score 35  , depression score n/a    Social History   Social History  . Marital status: Married    Spouse name: N/A  . Number of children: N/A  . Years of education: N/A   Occupational History  . Not on file.   Social History Main Topics  . Smoking status: Never Smoker  . Smokeless tobacco: Never Used  . Alcohol use Yes     Comment: occasional  . Drug use: No  . Sexual activity: Not on file   Other Topics Concern  . Not on  file   Social History Narrative  . No narrative on file    Family History  Problem Relation Age of Onset  . CAD Mother   . Deep vein thrombosis Mother   . Hyperlipidemia Mother   . COPD Mother   . AAA (abdominal aortic aneurysm) Mother   . Dementia Mother   . COPD Father   . CVA Brother   . Diabetes Brother   . Hyperlipidemia Brother     Past Medical History:  Diagnosis Date  . Dyspareunia 05/20/2014   Early menopause age 33  . Fatigue   . GERD (gastroesophageal reflux disease)   . Heterozygous factor V Leiden mutation ([place]) 05/20/2014   Dx 2007  No personal hx of thrombosis;  mother DVT age 29 on OC  . Osteopenia     . Sleep apnea     Past Surgical History:  Procedure Laterality Date  . INGUINAL HERNIA REPAIR    . SINUS EXPLORATION      Current Outpatient Prescriptions  Medication Sig Dispense Refill  . Acetaminophen (TYLENOL EXTRA STRENGTH PO) Take by mouth. prn    . aspirin EC 81 MG tablet Take 81 mg by mouth daily.    . Calcium Citrate (CITRACAL PO) Take by mouth. 2 tablets daily in pm    . escitalopram (LEXAPRO) 10 MG tablet every evening.  6  . FOLIC ACID PO Take by mouth. 1600 mcg in am    . levothyroxine (SYNTHROID, LEVOTHROID) 50 MCG tablet Take 50 mcg by mouth daily before breakfast.    . lisinopril (PRINIVIL,ZESTRIL) 20 MG tablet Take 20 mg by mouth daily.  11  . Lysine 500 MG TABS Take 2 tablets by mouth daily.    . Melatonin 3 MG CAPS Take by mouth. pm    . Multiple Vitamin (MULTIVITAMIN WITH MINERALS) TABS tablet Take 1 tablet by mouth daily.    . pantoprazole (PROTONIX) 40 MG tablet Take 40 mg by mouth daily.    . valACYclovir (VALTREX) 1000 MG tablet TAKE 1/2 TABLET DAILY FOR SUPPRESSION  5  . Vitamin D, Ergocalciferol, 2000 units CAPS Take by mouth. am     No current facility-administered medications for this visit.     Allergies as of 08/23/2017 - Review Complete 08/23/2017  Allergen Reaction Noted  . Codeine Rash 05/20/2014    Vitals: BP 126/80   Pulse 66   Ht 5\' 6"  (1.676 m)   Wt 215 lb (97.5 kg)   BMI 34.70 kg/m  Last Weight:  Wt Readings from Last 1 Encounters:  08/23/17 215 lb (97.5 kg)   ZYS:AYTK mass index is 34.7 kg/m.     Last Height:   Ht Readings from Last 1 Encounters:  08/23/17 5\' 6"  (1.676 m)    Physical exam:  General: The patient is awake, alert and appears not in acute distress. The patient is well groomed. Head: Normocephalic, atraumatic. Neck is supple. Mallampati 1  neck circumference: 15.75. Nasal airflow patent,  TMJ is evident . Retrognathia is seen.  Cardiovascular:  Regular rate and rhythm, without  murmurs or carotid bruit, and  without distended neck veins. Respiratory: Lungs are clear to auscultation. Skin:  Without evidence of edema, or rash Trunk: BMI is 35. The patient's posture is erect  Neurologic exam : The patient is awake and alert, oriented to place and time.   Memory subjective  described as intact.  Attention span & concentration ability appears normal.  Speech is fluent,  without dysarthria, dysphonia or aphasia.  Mood and affect are appropriate.  Cranial nerves: Pupils are equal and briskly reactive to light. Funduscopic exam without evidence of pallor or edema.  Extraocular movements  in vertical and horizontal planes intact and without nystagmus. Visual fields by finger perimetry are intact. Hearing to finger rub intact.  Facial sensation intact to fine touch. Facial motor strength is symmetric and tongue and uvula move midline. Shoulder shrug was symmetrical. Motor exam:  Normal tone, muscle bulk and symmetric strength in all extremities. Sensory:  Fine touch, pinprick and vibration were tested in all extremities. Proprioception tested in the upper extremities was normal. Coordination: Rapid alternating movements in the fingers/hands was normal. Finger-to-nose maneuver  normal without evidence of ataxia, dysmetria or tremor. Gait and station: Patient walks without assistive device .Tandem gait is unfragmented. Turns with 3 Steps. Romberg testing is negative. Deep tendon reflexes: in the  upper and lower extremities are symmetric and intact. Babinski maneuver response is downgoing.  Assessment:  After physical and neurologic examination, review of laboratory studies,  Personal review of imaging studies, reports of other /same  Imaging studies, results of polysomnography and / or neurophysiology testing and pre-existing records as far as provided in visit., my assessment is   1)  OSA likely still present- I will order a HST.   2)  Risk factors were discussed, her main risk factor is obesity.  3)   Fatigue and EDS may be also increased due to stressors - and she has set time aside for sleep.    The patient was advised of the nature of the diagnosed disorder , the treatment options and the  risks for general health and wellness arising from not treating the condition.   I spent more than 45 minutes of face to face time with the patient.  Greater than 50% of time was spent in counseling and coordination of care. We have discussed the diagnosis and differential and I answered the patient's questions.    Plan:  Treatment plan and additional workup : HST -    Larey Seat, MD 94/05/6545, 5:03 AM  Certified in Neurology by ABPN Certified in [place] by [facility] [address] [place], [place] [address]

## 2017-09-01 DIAGNOSIS — Z1212 Encounter for screening for malignant neoplasm of rectum: Secondary | ICD-10-CM | POA: Diagnosis not present

## 2017-09-05 DIAGNOSIS — L918 Other hypertrophic disorders of the skin: Secondary | ICD-10-CM | POA: Diagnosis not present

## 2017-09-05 DIAGNOSIS — L658 Other specified nonscarring hair loss: Secondary | ICD-10-CM | POA: Diagnosis not present

## 2017-09-05 DIAGNOSIS — D1800 Hemangioma unspecified site: Secondary | ICD-10-CM | POA: Diagnosis not present

## 2017-09-13 ENCOUNTER — Ambulatory Visit (INDEPENDENT_AMBULATORY_CARE_PROVIDER_SITE_OTHER): Payer: 59 | Admitting: Neurology

## 2017-09-13 DIAGNOSIS — G4710 Hypersomnia, unspecified: Secondary | ICD-10-CM | POA: Diagnosis not present

## 2017-09-13 DIAGNOSIS — R7983 Abnormal findings of blood amino-acid level: Secondary | ICD-10-CM

## 2017-09-13 DIAGNOSIS — R0683 Snoring: Secondary | ICD-10-CM

## 2017-09-13 DIAGNOSIS — G4719 Other hypersomnia: Secondary | ICD-10-CM

## 2017-09-13 DIAGNOSIS — D6851 Activated protein C resistance: Secondary | ICD-10-CM

## 2017-09-13 DIAGNOSIS — E7219 Other disorders of sulfur-bearing amino-acid metabolism: Secondary | ICD-10-CM

## 2017-09-19 NOTE — Procedures (Signed)
[facility] [address], [place] [address] NAME:      Yvonne Wyatt                                                       DOB: Sep 23, 1961 MEDICAL RECORD NUMBER     [id number]                                      DOS: 10/24/ 2018 REFERRING PHYSICIAN: Servando Salina, M.D.  STUDY PERFORMED: Home Sleep Study  HISTORY: [name] is a 56 year old. female, seen here in a re - evaluation for possible OSA. 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 She reports poor sleep, waking up with headaches, feeling sleepy during the day, and having trouble staying awake even when busy. In 2010 she was evaluated for sleep apnea and only mild obstructive sleep apnea was found at the time -she was placed on CPAP, but after a year discontinued it. She had lost a significant amount of weight which she recently re-gained and relates the return of symptoms to this weight gain.  She describes her sleep is fragmented by spontaneous arousals she is not sure what wakes her- she has to go to the bathroom once or twice each night, too. Feels as she hadn't slept. Headaches present every morning, generalized fatigue, being drowsy  Epworth score 8-10 points, Fatigue severity score 35, BMI 35         STUDY RESULTS: Total Recording: 8 hours, 30 minutes Total Apnea/Hypopnea Index (AHI):   12.1 /hour, RDI was 13.5/hr. Average Oxygen Saturation:  SpO2 at 94 %: Lowest Oxygen Saturation: 77 %  Time Oxygen Saturation Below 89%: 19 minutes = 2 % Average Heart Rate: 64 bpm, 50 to 107 bpm, NSR.  IMPRESSION: Mild Obstructive Sleep apnea with moderate snoring.  Clinically insignificant Hypoxemia. Normal pulse variability.  RECOMMENDATION: This mild degree of sleep apnea can be treated with CPAP, dental device or simply weight loss to reach a BMI of 27 or below.  I certify that I have reviewed the raw data recording prior to the issuance of this  report in accordance with the standards of the American Academy of Sleep Medicine (AASM). Larey Seat, M.D.      09-19-2017  Medical Director of [facility], accredited by AASM

## 2017-09-20 ENCOUNTER — Telehealth: Payer: Self-pay | Admitting: Neurology

## 2017-09-20 NOTE — Telephone Encounter (Signed)
-----   Message from Larey Seat, MD sent at 09/19/2017  5:53 PM EDT ----- This degree of apnea without comorbidity and without signs of physiological stress can be treated in many ways, CPAP is optional. A dental device could be considered, as well as weight loss.

## 2017-09-20 NOTE — Telephone Encounter (Signed)
I called pt. I advised pt that Dr. Brett Fairy reviewed their sleep study results and found that pt has mild sleep apnea. Dr. Brett Fairy recommends that pt can look at CPAP, dental device, or weight loss for treatment. Pt has stated that she would like to check into the dental device. She stated that she would call her dentist and reach back out to [place] with their information if they offer the dental device as treatment for a referral.  Pt verbalized understanding of results. Pt had no questions at this time but was encouraged to call back if questions arise.

## 2017-11-21 DIAGNOSIS — Z923 Personal history of irradiation: Secondary | ICD-10-CM

## 2017-11-21 HISTORY — DX: Personal history of irradiation: Z92.3

## 2017-12-07 ENCOUNTER — Telehealth: Payer: Self-pay | Admitting: Neurology

## 2017-12-07 ENCOUNTER — Encounter: Payer: Self-pay | Admitting: Neurology

## 2017-12-07 ENCOUNTER — Other Ambulatory Visit: Payer: Self-pay | Admitting: Neurology

## 2017-12-07 DIAGNOSIS — D6851 Activated protein C resistance: Secondary | ICD-10-CM

## 2017-12-07 DIAGNOSIS — G4733 Obstructive sleep apnea (adult) (pediatric): Secondary | ICD-10-CM

## 2017-12-07 NOTE — Telephone Encounter (Signed)
Called the patient and she is ready to proceed forward with treatment of her apnea via CPAP. I went over the information for CPAP. Pt states that her headaches have gotten out of hand and is hopeful that with using the CPAP it will help with the headaches. I will make the Dr Dohmeier aware of her decision and place an order today and get that sent off to [facility]. I went over the CPAP and how to use it. I schedule the patient for a follow up apt on March 06, 2018 at 7:45 am with Lowella Dandy.

## 2017-12-07 NOTE — Telephone Encounter (Signed)
Pt called she is wanting to try the CPAP. Please call to advise

## 2017-12-20 DIAGNOSIS — J029 Acute pharyngitis, unspecified: Secondary | ICD-10-CM | POA: Diagnosis not present

## 2017-12-21 DIAGNOSIS — R509 Fever, unspecified: Secondary | ICD-10-CM | POA: Diagnosis not present

## 2017-12-21 DIAGNOSIS — H6093 Unspecified otitis externa, bilateral: Secondary | ICD-10-CM | POA: Diagnosis not present

## 2017-12-21 DIAGNOSIS — R05 Cough: Secondary | ICD-10-CM | POA: Diagnosis not present

## 2017-12-21 DIAGNOSIS — G4733 Obstructive sleep apnea (adult) (pediatric): Secondary | ICD-10-CM | POA: Diagnosis not present

## 2017-12-21 DIAGNOSIS — J029 Acute pharyngitis, unspecified: Secondary | ICD-10-CM | POA: Diagnosis not present

## 2018-01-18 DIAGNOSIS — G4733 Obstructive sleep apnea (adult) (pediatric): Secondary | ICD-10-CM | POA: Diagnosis not present

## 2018-02-01 ENCOUNTER — Other Ambulatory Visit: Payer: Self-pay | Admitting: Obstetrics and Gynecology

## 2018-02-01 DIAGNOSIS — Z139 Encounter for screening, unspecified: Secondary | ICD-10-CM

## 2018-02-18 DIAGNOSIS — G4733 Obstructive sleep apnea (adult) (pediatric): Secondary | ICD-10-CM | POA: Diagnosis not present

## 2018-03-04 ENCOUNTER — Encounter: Payer: Self-pay | Admitting: Nurse Practitioner

## 2018-03-05 NOTE — Progress Notes (Signed)
[facility]  PATIENT: Yvonne Wyatt DOB: 1961-07-22   REASON FOR VISIT: Newly diagnosed obstructive sleep apnea with initial CPAP HISTORY FROM: Patient    HISTORY OF PRESENT ILLNESS:UPDATE 4/16/2019CM Yvonne Wyatt, 57 year old female returns for follow-up with a history of newly diagnosed obstructive sleep apnea here for initial CPAP.  She is doing well with her machine.  She has had approximately 3 months.  She has less nocturia at night.  Daytime drowsiness has improved.  CPAP data dated 02/03/2018-03/04/2018 shows compliance greater than 4 hours at 100%.  Average usage 8 hours 3 minutes.  Set pressure 5-12 cm.  EPR level 3 AHI 1.3 leaks 95 percentile at  4.5.  ESS 5.  She returns for reevaluation. 10/3/18CDJanice ANIRA Wyatt is a 57 y.o. female , seen here as in a referral  from Dr. Garwin Brothers for a re - evaluation for possible OSA.  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 She reports poor sleep waking up with headaches, feeling sleepy during the day and having trouble staying awake even when busy. In 2010 she was evaluated for the presence of sleep apnea , only mild obstructive sleep apnea was found at the time -she was placed on CPAP but after a year discontinued it. She had lost a significant amount of weight which she recently regained and relates the return of symptoms to this weight gain.  Her caretaker duties at home have been more than before, too. She has a history of hypothyroidism, osteopenia, obesity, fatigue, postmenopausal sleep disorder, factor V Leiden, atypical chest pain, reflux disease abdominal aorticectasia endometriosis, kidney stones, sinus surgery in 1992.  Chief complaint according to patient : Sleepy, fatigued.   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 She endorses a bedtime around 10:30 PM, and  is usually asleep promptly. Her bedroom is cool, quiet and dark. She shows a bedroom with her husband. She sleeps on one pillow but with an elevated head of bed to counteract gastric reflux. She describes her sleep is fragmented by spontaneous arousals she is not sure what wakes her- she has to go to the bathroom once or twice each night, too. She wakes by alarm at 5.50 AM.  Feels as she hadn't slept. Headaches present every morning, generalized achiness . Being drowsy    REVIEW OF SYSTEMS: Full 14 system review of systems performed and notable only for those listed, all others are neg:  Constitutional: neg  Cardiovascular: neg Ear/Nose/Throat: Runny nose Skin: neg Eyes: neg Respiratory: neg Gastroitestinal: neg  Hematology/Lymphatic: neg  Endocrine: neg Musculoskeletal:neg Allergy/Immunology: neg Neurological: Occasional headache  Psychiatric: neg Sleep : Obstructive sleep apnea with CPAP   ALLERGIES: Allergies  Allergen Reactions  . Codeine Rash    HOME MEDICATIONS: Outpatient Medications Prior to Visit  Medication Sig Dispense Refill  . Acetaminophen (TYLENOL EXTRA STRENGTH PO) Take by mouth. prn    . aspirin EC 81 MG tablet Take 81 mg by mouth daily.    . Calcium Citrate (CITRACAL PO) Take by mouth. 2 tablets daily in pm    . escitalopram (LEXAPRO) 10 MG tablet every evening.  6  . FOLIC ACID PO Take by mouth. 1600 mcg in am    . levothyroxine (SYNTHROID, LEVOTHROID) 50 MCG tablet Take 50 mcg by mouth daily before breakfast.    .  lisinopril (PRINIVIL,ZESTRIL) 20 MG tablet Take 20 mg by mouth daily.  11  . Lysine 500 MG TABS Take 2 tablets by mouth daily.    . Melatonin 3 MG CAPS Take by mouth. pm    . Multiple Vitamin (MULTIVITAMIN WITH MINERALS) TABS tablet Take 1 tablet by mouth daily.    . ondansetron (ZOFRAN-ODT) 8 MG disintegrating tablet DISSOLVE 1 TAB IN MOUTH 30 MIN PRIOR TO TAMIFLU AND EVERY 8 HOURS AS NEEDED NAUSEA  0  . pantoprazole (PROTONIX) 40 MG tablet  Take 40 mg by mouth daily.    . Probiotic Product (ALIGN PO) Take by mouth.    . promethazine (PHENERGAN) 25 MG tablet TAKE ONE TABLET EVERY 6 HOURS AS NEEDED FOR NAUSEA  1  . ranitidine (ZANTAC) 150 MG tablet Take 150 mg by mouth 2 (two) times daily.    . valACYclovir (VALTREX) 1000 MG tablet TAKE 1/2 TABLET DAILY FOR SUPPRESSION  5  . Vitamin D, Ergocalciferol, 2000 units CAPS Take by mouth. am     No facility-administered medications prior to visit.     PAST MEDICAL HISTORY: Past Medical History:  Diagnosis Date  . Dyspareunia 05/20/2014   Early menopause age 22  . Fatigue   . GERD (gastroesophageal reflux disease)   . Heterozygous factor V Leiden mutation ([place]) 05/20/2014   Dx 2007  No personal hx of thrombosis;  mother DVT age 42 on OC  . Osteopenia   . Sleep apnea     PAST SURGICAL HISTORY: Past Surgical History:  Procedure Laterality Date  . INGUINAL HERNIA REPAIR    . SINUS EXPLORATION      FAMILY HISTORY: Family History  Problem Relation Age of Onset  . CAD Mother   . Deep vein thrombosis Mother   . Hyperlipidemia Mother   . COPD Mother   . AAA (abdominal aortic aneurysm) Mother   . Dementia Mother   . COPD Father   . CVA Brother   . Diabetes Brother   . Hyperlipidemia Brother     SOCIAL HISTORY: Social History   Socioeconomic History  . Marital status: Married    Spouse name: Not on file  . Number of children: Not on file  . Years of education: Not on file  . Highest education level: Not on file  Occupational History  . Not on file  Social Needs  . Financial resource strain: Not on file  . Food insecurity:    Worry: Not on file    Inability: Not on file  . Transportation needs:    Medical: Not on file    Non-medical: Not on file  Tobacco Use  . Smoking status: Never Smoker  . Smokeless tobacco: Never Used  Substance and Sexual Activity  . Alcohol use: Yes    Comment: occasional  . Drug use: No  . Sexual activity: Not on file  Lifestyle    . Physical activity:    Days per week: Not on file    Minutes per session: Not on file  . Stress: Not on file  Relationships  . Social connections:    Talks on phone: Not on file    Gets together: Not on file    Attends religious service: Not on file    Active member of club or organization: Not on file    Attends meetings of clubs or organizations: Not on file    Relationship status: Not on file  . Intimate partner violence:    Fear of current or  ex partner: Not on file    Emotionally abused: Not on file    Physically abused: Not on file    Forced sexual activity: Not on file  Other Topics Concern  . Not on file  Social History Narrative  . Not on file     PHYSICAL EXAM  Vitals:   03/06/18 0728  BP: 123/72  Pulse: 71  Weight: 224 lb (101.6 kg)  Height: 5\' 6"  (1.676 m)   Body mass index is 36.15 kg/m.  Generalized: Well developed, in no acute distress  Head: normocephalic and atraumatic,. Oropharynx benign malopatti 1-2 Neck: Supple, circumference 15.75 Musculoskeletal: No deformity   Neurological examination   Mentation: Alert oriented to time, place, history taking. Attention span and concentration appropriate. Recent and remote memory intact.  Follows all commands speech and language fluent.   Cranial nerve II-XII: Pupils were equal round reactive to light extraocular movements were full, visual field were full on confrontational test. Facial sensation and strength were normal. hearing was intact to finger rubbing bilaterally. Uvula tongue midline. head turning and shoulder shrug were normal and symmetric.Tongue protrusion into cheek strength was normal. Motor: normal bulk and tone, full strength in the BUE, BLE, Sensory: normal and symmetric to light touch, Coordination: finger-nose-finger, heel-to-shin bilaterally, no dysmetria Gait and Station: Rising up from seated position without assistance, normal stance,  moderate stride, good arm swing, smooth turning,  able to perform tiptoe, and heel walking without difficulty. Tandem gait is steady  DIAGNOSTIC DATA (LABS, IMAGING, TESTING) - I reviewed patient records, labs, notes, testing and imaging myself where available.  Lab Results  Component Value Date   WBC 6.8 04/15/2011   HGB 13.7 04/15/2011   HCT 40.6 04/15/2011   MCV 90.8 04/15/2011   PLT 253 04/15/2011      Component Value Date/Time   NA 140 05/02/2006 1027   K 4.0 05/02/2006 1027   CL 104 05/02/2006 1027   CO2 22 05/02/2006 1027   GLUCOSE 99 05/02/2006 1027   BUN 12 05/02/2006 1027   CREATININE 0.9 05/02/2006 1027   CALCIUM 9.6 05/02/2006 1027    ASSESSMENT AND PLAN  57 y.o. year old female  has a past medical history of Dyspareunia (05/20/2014), Fatigue, GERD (gastroesophageal reflux disease), Heterozygous factor V Leiden mutation ([place]) (05/20/2014), Osteopenia, and Sleep apnea.  newly diagnosed here for initial CPAP compliance.CPAP data dated 02/03/2018-03/04/2018 shows compliance greater than 4 hours at 100%.  Average usage 8 hours 3 minutes.  Set pressure 5-12 cm.  EPR level 3 AHI 1.3 leaks 95 percentile at  4.5.  ESS 5 The patient is a current patient of Dr. Brett Fairy who is out of the office today . This note is sent to the work in doctor.     PLAN: CPAP compliance 100% reviewed data with patient Continue same settings Follow-up in 6 months for repeat compliance Dennie Bible, [facility], [facility], APRN  [facility] [address], [place], [place] [address] [phone]

## 2018-03-06 ENCOUNTER — Ambulatory Visit: Payer: 59 | Admitting: Nurse Practitioner

## 2018-03-06 ENCOUNTER — Encounter: Payer: Self-pay | Admitting: Nurse Practitioner

## 2018-03-06 DIAGNOSIS — Z9989 Dependence on other enabling machines and devices: Secondary | ICD-10-CM | POA: Diagnosis not present

## 2018-03-06 DIAGNOSIS — G4733 Obstructive sleep apnea (adult) (pediatric): Secondary | ICD-10-CM | POA: Diagnosis not present

## 2018-03-06 NOTE — Progress Notes (Signed)
I have read the note, and I agree with the clinical assessment and plan.  Kaenan Jake K Sufian Ravi   

## 2018-03-06 NOTE — Patient Instructions (Signed)
CPAP compliance 100% Continue same settings Follow-up in 6 months  

## 2018-03-14 ENCOUNTER — Ambulatory Visit
Admission: RE | Admit: 2018-03-14 | Discharge: 2018-03-14 | Disposition: A | Discharge status: Home / Self Care | Payer: 59 | Source: Ambulatory Visit | Attending: Obstetrics and Gynecology | Admitting: Obstetrics and Gynecology

## 2018-03-14 DIAGNOSIS — Z139 Encounter for screening, unspecified: Secondary | ICD-10-CM

## 2018-03-14 DIAGNOSIS — Z1231 Encounter for screening mammogram for malignant neoplasm of breast: Secondary | ICD-10-CM | POA: Diagnosis not present

## 2018-03-15 ENCOUNTER — Other Ambulatory Visit: Payer: Self-pay | Admitting: Obstetrics and Gynecology

## 2018-03-15 DIAGNOSIS — R928 Other abnormal and inconclusive findings on diagnostic imaging of breast: Secondary | ICD-10-CM

## 2018-03-19 ENCOUNTER — Ambulatory Visit
Admission: RE | Admit: 2018-03-19 | Discharge: 2018-03-19 | Disposition: A | Discharge status: Home / Self Care | Payer: 59 | Source: Ambulatory Visit | Attending: Obstetrics and Gynecology | Admitting: Obstetrics and Gynecology

## 2018-03-19 ENCOUNTER — Other Ambulatory Visit: Payer: Self-pay | Admitting: Obstetrics and Gynecology

## 2018-03-19 DIAGNOSIS — N6489 Other specified disorders of breast: Secondary | ICD-10-CM

## 2018-03-19 DIAGNOSIS — R928 Other abnormal and inconclusive findings on diagnostic imaging of breast: Secondary | ICD-10-CM

## 2018-03-19 DIAGNOSIS — N6001 Solitary cyst of right breast: Secondary | ICD-10-CM | POA: Diagnosis not present

## 2018-03-19 DIAGNOSIS — N6002 Solitary cyst of left breast: Secondary | ICD-10-CM | POA: Diagnosis not present

## 2018-03-19 DIAGNOSIS — N6011 Diffuse cystic mastopathy of right breast: Secondary | ICD-10-CM | POA: Diagnosis not present

## 2018-03-20 DIAGNOSIS — G4733 Obstructive sleep apnea (adult) (pediatric): Secondary | ICD-10-CM | POA: Diagnosis not present

## 2018-03-30 DIAGNOSIS — N6489 Other specified disorders of breast: Secondary | ICD-10-CM | POA: Diagnosis not present

## 2018-04-02 ENCOUNTER — Other Ambulatory Visit: Payer: Self-pay | Admitting: General Surgery

## 2018-04-02 DIAGNOSIS — N6489 Other specified disorders of breast: Secondary | ICD-10-CM

## 2018-04-09 ENCOUNTER — Other Ambulatory Visit: Payer: Self-pay | Admitting: General Surgery

## 2018-04-09 DIAGNOSIS — N6489 Other specified disorders of breast: Secondary | ICD-10-CM

## 2018-04-20 DIAGNOSIS — G4733 Obstructive sleep apnea (adult) (pediatric): Secondary | ICD-10-CM | POA: Diagnosis not present

## 2018-04-21 DIAGNOSIS — C50919 Malignant neoplasm of unspecified site of unspecified female breast: Secondary | ICD-10-CM

## 2018-04-21 HISTORY — DX: Malignant neoplasm of unspecified site of unspecified female breast: C50.919

## 2018-04-21 HISTORY — PX: BREAST LUMPECTOMY: SHX2

## 2018-04-24 NOTE — Pre-Procedure Instructions (Addendum)
Yvonne Wyatt [facility]  04/24/2018      [id number] [address]von[address] Wyatt, [place] [address] Phone: [phone] Fax: [phone]    Your procedure is scheduled on April 30, 2018.  Report to [facility] Admitting at 700 AM.  Call this number if you have problems the morning of surgery:  [phone]   Remember:  No food after midnight.  You may drink clear liquids until 600 AM (3 hours prior to start of your procedure).  Clear liquids allowed are:  Ensure pre-surgery drink, Water, Juice (non-citric and without pulp), Carbonated beverages, Clear Tea, Black Coffee only, Plain Jell-O only, Gatorade and Plain Popsicles only (NO MILK PRODUCTS)  Please complete your PRE-SURGERY ENSURE that was given to before you leave your house the morning of surgery.  Please, if able, drink it in one setting. DO NOT SIP.  Continue all medications as directed by your physician except follow these medication instructions before surgery below    Take these medicines the morning of surgery with A SIP OF WATER  Tylenol-if needed levothyroxine (synthroid) Pantoprazole (protonix) Ranitidine (zantac)  Follow your surgeon's instructions on when to hold/resume aspirin  7 days prior to surgery STOP taking any Aleve, Naproxen, Ibuprofen, Motrin, Advil, Goody's, BC's, all herbal medications, fish oil, and all vitamins   Do not wear jewelry, make-up or nail polish.  Do not wear lotions, powders, or perfumes, or deodorant.  Do not shave 48 hours prior to surgery.    Do not bring valuables to the hospital.  [facility] is not responsible for any belongings or valuables.  Contacts, dentures or bridgework may not be worn into surgery.  Leave your suitcase in the car.  After surgery it may be brought to your room.  For patients admitted to the hospital, discharge time will be determined by your treatment team.  Patients discharged the day of surgery will not be  allowed to drive home.    [facility]- Preparing For Surgery  Before surgery, you can play an important role. Because skin is not sterile, your skin needs to be as free of germs as possible. You can reduce the number of germs on your skin by washing with CHG (chlorahexidine gluconate) Soap before surgery.  CHG is an antiseptic cleaner which kills germs and bonds with the skin to continue killing germs even after washing.    Oral Hygiene is also important to reduce your risk of infection.  Remember - BRUSH YOUR TEETH THE MORNING OF SURGERY WITH YOUR REGULAR TOOTHPASTE  Please do not use if you have an allergy to CHG or antibacterial soaps. If your skin becomes reddened/irritated stop using the CHG.  Do not shave (including legs and underarms) for at least 48 hours prior to first CHG shower. It is OK to shave your face.  Please follow these instructions carefully.   1. Shower the NIGHT BEFORE SURGERY and the MORNING OF SURGERY with CHG.   2. If you chose to wash your hair, wash your hair first as usual with your normal shampoo.  3. After you shampoo, rinse your hair and body thoroughly to remove the shampoo.  4. Use CHG as you would any other liquid soap. You can apply CHG directly to the skin and wash gently with a scrungie or a clean washcloth.   5. Apply the CHG Soap to your body ONLY FROM THE NECK DOWN.  Do not use on open wounds or  open sores. Avoid contact with your eyes, ears, mouth and genitals (private parts). Wash Face and genitals (private parts)  with your normal soap.  6. Wash thoroughly, paying special attention to the area where your surgery will be performed.  7. Thoroughly rinse your body with warm water from the neck down.  8. DO NOT shower/wash with your normal soap after using and rinsing off the CHG Soap.  9. Pat yourself dry with a CLEAN TOWEL.  10. Wear CLEAN PAJAMAS to bed the night before surgery, wear comfortable clothes the morning of surgery  11. Place  CLEAN SHEETS on your bed the night of your first shower and DO NOT SLEEP WITH PETS.  Day of Surgery:  Do not apply any deodorants/lotions.  Please wear clean clothes to the hospital/surgery center.   Remember to brush your teeth WITH YOUR REGULAR TOOTHPASTE.  Please read over the following fact sheets that you were given. Pain Booklet, Coughing and Deep Breathing and Surgical Site Infection Prevention

## 2018-04-24 NOTE — Progress Notes (Addendum)
PCP: Marton Redwood, MD  Cardiologist: pt denies  EKG: pt denies past year, obtained today  Stress test: pt denies ever  ECHO: pt denies ever  Cardiac Cath: pt denies ever  Chest x-ray: pt denies past year, no recern

## 2018-04-25 ENCOUNTER — Other Ambulatory Visit: Payer: Self-pay

## 2018-04-25 ENCOUNTER — Encounter (HOSPITAL_COMMUNITY)
Admission: RE | Admit: 2018-04-25 | Discharge: 2018-04-25 | Disposition: A | Discharge status: Home / Self Care | Payer: 59 | Source: Ambulatory Visit | Attending: General Surgery | Admitting: General Surgery

## 2018-04-25 ENCOUNTER — Encounter (HOSPITAL_COMMUNITY): Payer: Self-pay

## 2018-04-25 DIAGNOSIS — R928 Other abnormal and inconclusive findings on diagnostic imaging of breast: Secondary | ICD-10-CM | POA: Diagnosis not present

## 2018-04-25 DIAGNOSIS — Z01818 Encounter for other preprocedural examination: Secondary | ICD-10-CM | POA: Insufficient documentation

## 2018-04-25 DIAGNOSIS — Z01812 Encounter for preprocedural laboratory examination: Secondary | ICD-10-CM | POA: Diagnosis not present

## 2018-04-25 HISTORY — DX: Personal history of other diseases of the digestive system: Z87.19

## 2018-04-25 HISTORY — DX: Other complications of anesthesia, initial encounter: T88.59XA

## 2018-04-25 HISTORY — DX: Nausea with vomiting, unspecified: R11.2

## 2018-04-25 HISTORY — DX: Other specified postprocedural states: Z98.890

## 2018-04-25 HISTORY — DX: Adverse effect of unspecified anesthetic, initial encounter: T41.45XA

## 2018-04-25 HISTORY — DX: Essential (primary) hypertension: I10

## 2018-04-25 HISTORY — DX: Headache: R51

## 2018-04-25 HISTORY — DX: Headache, unspecified: R51.9

## 2018-04-25 HISTORY — DX: Hypothyroidism, unspecified: E03.9

## 2018-04-25 LAB — CBC
HCT: 37.6 % (ref 36.0–46.0)
Hemoglobin: 12.4 g/dL (ref 12.0–15.0)
MCH: 31.5 pg (ref 26.0–34.0)
MCHC: 33 g/dL (ref 30.0–36.0)
MCV: 95.4 fL (ref 78.0–100.0)
PLATELETS: 244 10*3/uL (ref 150–400)
RBC: 3.94 MIL/uL (ref 3.87–5.11)
RDW: 11.8 % (ref 11.5–15.5)
WBC: 4.8 10*3/uL (ref 4.0–10.5)

## 2018-04-25 LAB — BASIC METABOLIC PANEL
Anion gap: 7 (ref 5–15)
BUN: 11 mg/dL (ref 6–20)
CALCIUM: 9.2 mg/dL (ref 8.9–10.3)
CO2: 25 mmol/L (ref 22–32)
CREATININE: 0.98 mg/dL (ref 0.44–1.00)
Chloride: 108 mmol/L (ref 101–111)
GFR calc non Af Amer: >60 mL/min (ref >60)
Glucose, Bld: 90 mg/dL (ref 65–99)
Potassium: 3.8 mmol/L (ref 3.5–5.1)
Sodium: 140 mmol/L (ref 135–145)

## 2018-04-26 ENCOUNTER — Ambulatory Visit
Admission: RE | Admit: 2018-04-26 | Discharge: 2018-04-26 | Disposition: A | Discharge status: Home / Self Care | Payer: 59 | Source: Ambulatory Visit | Attending: General Surgery | Admitting: General Surgery

## 2018-04-26 DIAGNOSIS — N6489 Other specified disorders of breast: Secondary | ICD-10-CM

## 2018-04-26 DIAGNOSIS — R928 Other abnormal and inconclusive findings on diagnostic imaging of breast: Secondary | ICD-10-CM | POA: Diagnosis not present

## 2018-04-30 ENCOUNTER — Ambulatory Visit (HOSPITAL_COMMUNITY): Payer: 59 | Admitting: Emergency Medicine

## 2018-04-30 ENCOUNTER — Encounter (HOSPITAL_COMMUNITY)
Admission: RE | Disposition: A | Discharge status: Home / Self Care | Payer: Self-pay | Source: Ambulatory Visit | Attending: General Surgery

## 2018-04-30 ENCOUNTER — Encounter (HOSPITAL_COMMUNITY): Payer: Self-pay | Admitting: *Deleted

## 2018-04-30 ENCOUNTER — Ambulatory Visit
Admission: RE | Admit: 2018-04-30 | Discharge: 2018-04-30 | Disposition: A | Discharge status: Home / Self Care | Payer: 59 | Source: Ambulatory Visit | Attending: General Surgery | Admitting: General Surgery

## 2018-04-30 ENCOUNTER — Ambulatory Visit (HOSPITAL_COMMUNITY): Payer: 59 | Admitting: Certified Registered"

## 2018-04-30 ENCOUNTER — Ambulatory Visit (HOSPITAL_COMMUNITY)
Admission: RE | Admit: 2018-04-30 | Discharge: 2018-04-30 | Disposition: A | Discharge status: Home / Self Care | Payer: 59 | Source: Ambulatory Visit | Attending: General Surgery | Admitting: General Surgery

## 2018-04-30 DIAGNOSIS — Z9989 Dependence on other enabling machines and devices: Secondary | ICD-10-CM | POA: Insufficient documentation

## 2018-04-30 DIAGNOSIS — Z79890 Hormone replacement therapy (postmenopausal): Secondary | ICD-10-CM | POA: Insufficient documentation

## 2018-04-30 DIAGNOSIS — G4730 Sleep apnea, unspecified: Secondary | ICD-10-CM | POA: Diagnosis not present

## 2018-04-30 DIAGNOSIS — Z79899 Other long term (current) drug therapy: Secondary | ICD-10-CM | POA: Diagnosis not present

## 2018-04-30 DIAGNOSIS — D0511 Intraductal carcinoma in situ of right breast: Secondary | ICD-10-CM | POA: Diagnosis not present

## 2018-04-30 DIAGNOSIS — D6851 Activated protein C resistance: Secondary | ICD-10-CM | POA: Diagnosis not present

## 2018-04-30 DIAGNOSIS — N6489 Other specified disorders of breast: Secondary | ICD-10-CM | POA: Diagnosis not present

## 2018-04-30 DIAGNOSIS — E039 Hypothyroidism, unspecified: Secondary | ICD-10-CM | POA: Insufficient documentation

## 2018-04-30 DIAGNOSIS — K219 Gastro-esophageal reflux disease without esophagitis: Secondary | ICD-10-CM | POA: Insufficient documentation

## 2018-04-30 DIAGNOSIS — R928 Other abnormal and inconclusive findings on diagnostic imaging of breast: Secondary | ICD-10-CM | POA: Diagnosis not present

## 2018-04-30 DIAGNOSIS — I10 Essential (primary) hypertension: Secondary | ICD-10-CM | POA: Insufficient documentation

## 2018-04-30 HISTORY — PX: RADIOACTIVE SEED GUIDED EXCISIONAL BREAST BIOPSY: SHX6490

## 2018-04-30 SURGERY — RADIOACTIVE SEED GUIDED BREAST BIOPSY
Anesthesia: General | Site: Breast | Laterality: Right

## 2018-04-30 MED ORDER — GABAPENTIN 100 MG PO CAPS
100.0000 mg | ORAL_CAPSULE | ORAL | Status: AC
  Administered 2018-04-30: 100 mg via ORAL
  Filled 2018-04-30 (×2): qty 1

## 2018-04-30 MED ORDER — SODIUM CHLORIDE 0.9 % IV SOLN: INTRAVENOUS | Status: DC

## 2018-04-30 MED ORDER — ENSURE PRE-SURGERY PO LIQD
296.0000 mL | Freq: Once | ORAL | Status: DC
  Filled 2018-04-30 (×2): qty 296

## 2018-04-30 MED ORDER — ACETAMINOPHEN 325 MG PO TABS: 325.0000 mg | ORAL_TABLET | ORAL | Status: DC | PRN

## 2018-04-30 MED ORDER — PROPOFOL 10 MG/ML IV BOLUS
INTRAVENOUS | Status: DC | PRN
  Administered 2018-04-30: 120 mg via INTRAVENOUS

## 2018-04-30 MED ORDER — SODIUM CHLORIDE 0.9% FLUSH: 3.0000 mL | Freq: Two times a day (BID) | INTRAVENOUS | Status: DC

## 2018-04-30 MED ORDER — SODIUM CHLORIDE 0.9 % IV SOLN: 250.0000 mL | INTRAVENOUS | Status: DC | PRN

## 2018-04-30 MED ORDER — MORPHINE SULFATE (PF) 2 MG/ML IV SOLN: 1.0000 mg | INTRAVENOUS | Status: DC | PRN

## 2018-04-30 MED ORDER — FENTANYL CITRATE (PF) 100 MCG/2ML IJ SOLN: 25.0000 ug | INTRAMUSCULAR | Status: DC | PRN

## 2018-04-30 MED ORDER — KETOROLAC TROMETHAMINE 15 MG/ML IJ SOLN
15.0000 mg | INTRAMUSCULAR | Status: AC
  Administered 2018-04-30: 15 mg via INTRAVENOUS
  Filled 2018-04-30: qty 1

## 2018-04-30 MED ORDER — MIDAZOLAM HCL 2 MG/2ML IJ SOLN
INTRAMUSCULAR | Status: AC
  Filled 2018-04-30: qty 2

## 2018-04-30 MED ORDER — KETOROLAC TROMETHAMINE 30 MG/ML IJ SOLN: 30.0000 mg | Freq: Once | INTRAMUSCULAR | Status: DC | PRN

## 2018-04-30 MED ORDER — ACETAMINOPHEN 325 MG PO TABS: 650.0000 mg | ORAL_TABLET | ORAL | Status: DC | PRN

## 2018-04-30 MED ORDER — LACTATED RINGERS IV SOLN
INTRAVENOUS | Status: DC
  Administered 2018-04-30: 08:00:00 via INTRAVENOUS

## 2018-04-30 MED ORDER — ONDANSETRON HCL 4 MG/2ML IJ SOLN
INTRAMUSCULAR | Status: DC | PRN
  Administered 2018-04-30: 4 mg via INTRAVENOUS

## 2018-04-30 MED ORDER — FENTANYL CITRATE (PF) 250 MCG/5ML IJ SOLN
INTRAMUSCULAR | Status: AC
  Filled 2018-04-30: qty 5

## 2018-04-30 MED ORDER — LIDOCAINE 2% (20 MG/ML) 5 ML SYRINGE
INTRAMUSCULAR | Status: DC | PRN
  Administered 2018-04-30: 100 mg via INTRAVENOUS

## 2018-04-30 MED ORDER — BUPIVACAINE-EPINEPHRINE (PF) 0.25% -1:200000 IJ SOLN
INTRAMUSCULAR | Status: AC
  Filled 2018-04-30: qty 30

## 2018-04-30 MED ORDER — OXYCODONE HCL 5 MG PO TABS
ORAL_TABLET | ORAL | Status: AC
  Filled 2018-04-30: qty 1

## 2018-04-30 MED ORDER — CEFAZOLIN SODIUM-DEXTROSE 2-4 GM/100ML-% IV SOLN
2.0000 g | INTRAVENOUS | Status: AC
  Administered 2018-04-30: 2 g via INTRAVENOUS
  Filled 2018-04-30: qty 100

## 2018-04-30 MED ORDER — ACETAMINOPHEN 10 MG/ML IV SOLN
INTRAVENOUS | Status: AC
  Filled 2018-04-30: qty 100

## 2018-04-30 MED ORDER — 0.9 % SODIUM CHLORIDE (POUR BTL) OPTIME
TOPICAL | Status: DC | PRN
  Administered 2018-04-30: 1000 mL

## 2018-04-30 MED ORDER — PHENYLEPHRINE 40 MCG/ML (10ML) SYRINGE FOR IV PUSH (FOR BLOOD PRESSURE SUPPORT)
PREFILLED_SYRINGE | INTRAVENOUS | Status: AC
  Filled 2018-04-30: qty 10

## 2018-04-30 MED ORDER — ONDANSETRON HCL 4 MG/2ML IJ SOLN: 4.0000 mg | Freq: Once | INTRAMUSCULAR | Status: DC | PRN

## 2018-04-30 MED ORDER — ACETAMINOPHEN 500 MG PO TABS
1000.0000 mg | ORAL_TABLET | ORAL | Status: AC
  Administered 2018-04-30: 1000 mg via ORAL
  Filled 2018-04-30: qty 2

## 2018-04-30 MED ORDER — DEXAMETHASONE SODIUM PHOSPHATE 10 MG/ML IJ SOLN
INTRAMUSCULAR | Status: DC | PRN
  Administered 2018-04-30: 10 mg via INTRAVENOUS

## 2018-04-30 MED ORDER — FENTANYL CITRATE (PF) 250 MCG/5ML IJ SOLN
INTRAMUSCULAR | Status: DC | PRN
  Administered 2018-04-30: 150 ug via INTRAVENOUS
  Administered 2018-04-30: 25 ug via INTRAVENOUS

## 2018-04-30 MED ORDER — ACETAMINOPHEN 650 MG RE SUPP: 650.0000 mg | RECTAL | Status: DC | PRN

## 2018-04-30 MED ORDER — HEPARIN SODIUM (PORCINE) 5000 UNIT/ML IJ SOLN
5000.0000 [IU] | Freq: Once | INTRAMUSCULAR | Status: AC
  Administered 2018-04-30: 5000 [IU] via SUBCUTANEOUS
  Filled 2018-04-30: qty 1

## 2018-04-30 MED ORDER — SCOPOLAMINE 1 MG/3DAYS TD PT72
1.0000 | MEDICATED_PATCH | TRANSDERMAL | Status: DC
  Administered 2018-04-30: 1.5 mg via TRANSDERMAL
  Filled 2018-04-30: qty 1

## 2018-04-30 MED ORDER — PHENYLEPHRINE HCL 10 MG/ML IJ SOLN
INTRAMUSCULAR | Status: DC | PRN
  Administered 2018-04-30: 80 ug via INTRAVENOUS

## 2018-04-30 MED ORDER — BUPIVACAINE-EPINEPHRINE 0.25% -1:200000 IJ SOLN
INTRAMUSCULAR | Status: DC | PRN
  Administered 2018-04-30: 10 mL

## 2018-04-30 MED ORDER — LIDOCAINE 2% (20 MG/ML) 5 ML SYRINGE
INTRAMUSCULAR | Status: AC
  Filled 2018-04-30: qty 5

## 2018-04-30 MED ORDER — ONDANSETRON HCL 4 MG/2ML IJ SOLN
INTRAMUSCULAR | Status: AC
  Filled 2018-04-30: qty 2

## 2018-04-30 MED ORDER — OXYCODONE HCL 5 MG PO TABS
5.0000 mg | ORAL_TABLET | ORAL | Status: DC | PRN
  Administered 2018-04-30: 5 mg via ORAL

## 2018-04-30 MED ORDER — SCOPOLAMINE 1 MG/3DAYS TD PT72
MEDICATED_PATCH | TRANSDERMAL | Status: AC
  Administered 2018-04-30: 1.5 mg via TRANSDERMAL
  Filled 2018-04-30: qty 1

## 2018-04-30 MED ORDER — ACETAMINOPHEN 160 MG/5ML PO SOLN: 325.0000 mg | ORAL | Status: DC | PRN

## 2018-04-30 MED ORDER — SODIUM CHLORIDE 0.9% FLUSH: 3.0000 mL | INTRAVENOUS | Status: DC | PRN

## 2018-04-30 MED ORDER — MIDAZOLAM HCL 5 MG/5ML IJ SOLN
INTRAMUSCULAR | Status: DC | PRN
  Administered 2018-04-30: 2 mg via INTRAVENOUS

## 2018-04-30 MED ORDER — MEPERIDINE HCL 50 MG/ML IJ SOLN: 6.2500 mg | INTRAMUSCULAR | Status: DC | PRN

## 2018-04-30 SURGICAL SUPPLY — 51 items
ADH SKN CLS APL DERMABOND .7 (GAUZE/BANDAGES/DRESSINGS) ×1
APPLIER CLIP 9.375 MED OPEN (MISCELLANEOUS) ×2
APR CLP MED 9.3 20 MLT OPN (MISCELLANEOUS) ×1
BINDER BREAST XLRG (GAUZE/BANDAGES/DRESSINGS) ×1 IMPLANT
BLADE SURG 10 STRL SS (BLADE) ×2 IMPLANT
BLADE SURG 15 STRL LF DISP TIS (BLADE) ×1 IMPLANT
BLADE SURG 15 STRL SS (BLADE) ×2
CANISTER SUCT 3000ML PPV (MISCELLANEOUS) ×1 IMPLANT
CHLORAPREP W/TINT 26ML (MISCELLANEOUS) ×2 IMPLANT
CLIP APPLIE 9.375 MED OPEN (MISCELLANEOUS) IMPLANT
CLSR STERI-STRIP ANTIMIC 1/2X4 (GAUZE/BANDAGES/DRESSINGS) ×1 IMPLANT
COVER PROBE W GEL 5X96 (DRAPES) ×2 IMPLANT
COVER SURGICAL LIGHT HANDLE (MISCELLANEOUS) ×2 IMPLANT
DERMABOND ADVANCED (GAUZE/BANDAGES/DRESSINGS) ×1
DERMABOND ADVANCED .7 DNX12 (GAUZE/BANDAGES/DRESSINGS) ×1 IMPLANT
DEVICE DUBIN SPECIMEN MAMMOGRA (MISCELLANEOUS) ×2 IMPLANT
DRAPE CHEST BREAST 15X10 FENES (DRAPES) ×2 IMPLANT
DRAPE UTILITY XL STRL (DRAPES) ×2 IMPLANT
ELECT COATED BLADE 2.86 ST (ELECTRODE) ×2 IMPLANT
ELECT REM PT RETURN 9FT ADLT (ELECTROSURGICAL) ×2
ELECTRODE REM PT RTRN 9FT ADLT (ELECTROSURGICAL) ×1 IMPLANT
GLOVE BIO SURGEON STRL SZ7 (GLOVE) ×2 IMPLANT
GLOVE BIOGEL PI IND STRL 7.5 (GLOVE) ×1 IMPLANT
GLOVE BIOGEL PI INDICATOR 7.5 (GLOVE) ×1
GOWN STRL REUS W/ TWL LRG LVL3 (GOWN DISPOSABLE) ×2 IMPLANT
GOWN STRL REUS W/TWL LRG LVL3 (GOWN DISPOSABLE) ×4
HEMOSTAT ARISTA ABSORB 3G PWDR (MISCELLANEOUS) IMPLANT
ILLUMINATOR WAVEGUIDE N/F (MISCELLANEOUS) IMPLANT
KIT BASIN OR (CUSTOM PROCEDURE TRAY) ×2 IMPLANT
KIT MARKER MARGIN INK (KITS) ×2 IMPLANT
LIGHT WAVEGUIDE WIDE FLAT (MISCELLANEOUS) IMPLANT
MARKER SKIN DUAL TIP RULER LAB (MISCELLANEOUS) ×2 IMPLANT
NDL HYPO 25GX1X1/2 BEV (NEEDLE) ×1 IMPLANT
NEEDLE HYPO 25GX1X1/2 BEV (NEEDLE) ×2 IMPLANT
NS IRRIG 1000ML POUR BTL (IV SOLUTION) IMPLANT
PACK SURGICAL SETUP 50X90 (CUSTOM PROCEDURE TRAY) ×2 IMPLANT
PENCIL BUTTON HOLSTER BLD 10FT (ELECTRODE) ×2 IMPLANT
SPONGE LAP 18X18 X RAY DECT (DISPOSABLE) ×2 IMPLANT
STRIP CLOSURE SKIN 1/2X4 (GAUZE/BANDAGES/DRESSINGS) ×2 IMPLANT
SUT MNCRL AB 4-0 PS2 18 (SUTURE) ×2 IMPLANT
SUT SILK 2 0 SH (SUTURE) IMPLANT
SUT VIC AB 2-0 SH 27 (SUTURE) ×2
SUT VIC AB 2-0 SH 27XBRD (SUTURE) ×1 IMPLANT
SUT VIC AB 3-0 SH 27 (SUTURE) ×2
SUT VIC AB 3-0 SH 27X BRD (SUTURE) ×1 IMPLANT
SYR BULB 3OZ (MISCELLANEOUS) ×2 IMPLANT
SYR CONTROL 10ML LL (SYRINGE) ×2 IMPLANT
TOWEL OR 17X24 6PK STRL BLUE (TOWEL DISPOSABLE) ×2 IMPLANT
TOWEL OR 17X26 10 PK STRL BLUE (TOWEL DISPOSABLE) ×2 IMPLANT
TUBE CONNECTING 12X1/4 (SUCTIONS) ×1 IMPLANT
YANKAUER SUCT BULB TIP NO VENT (SUCTIONS) IMPLANT

## 2018-04-30 NOTE — Anesthesia Postprocedure Evaluation (Signed)
Anesthesia Post Note  Patient: Yvonne Wyatt  Procedure(s) Performed: RIGHT RADIOACTIVE SEED GUIDED EXCISIONAL BREAST BIOPSY ERAS PATHWAY (Right Breast)     Patient location during evaluation: PACU Anesthesia Type: General Level of consciousness: awake Pain management: pain level controlled Vital Signs Assessment: post-procedure vital signs reviewed and stable Respiratory status: spontaneous breathing Cardiovascular status: stable Postop Assessment: no apparent nausea or vomiting Anesthetic complications: no    Last Vitals:  Vitals:   04/30/18 1037 04/30/18 1051  BP: 121/84 128/78  Pulse:  83  Resp:  18  Temp:    SpO2: 95% 94%    Last Pain:  Vitals:   04/30/18 1051  TempSrc:   PainSc: 0-No pain   Pain Goal: Patients Stated Pain Goal: 0 (04/30/18 1000)               [place]

## 2018-04-30 NOTE — Op Note (Signed)
Preoperative diagnoses:RIght breast distortion with csl on core biopsy Postoperative diagnosis: Same as above Procedure:Rightbreastseed guided excisional biopsy Surgeon: Dr. Serita Grammes Anesthesia: Gen. Estimated blood loss: minimal Complications: None Drains: None Specimens:Right breast tissue marked with paint Sponge and needle count correct at completion Disposition to recovery stable  Indications: This is a56yof with abnormal mammogram. A distortion was biopsied and this is a csl. She was given options of observation vs excision. We have elected to proceed with excision. A seed was placed prior to beginning. I had the mammograms in the OR.  Procedure: After informed consent was obtained she was then taken to the operating room. She was given antibiotics.Sequential compression devices were on her legs. She was placed under general anesthesia without complication. Her chestwas then prepped and draped in the standard sterile surgical fashion. A surgical timeout was then performed.   The seed was in theupper outer quadrant and not far from the skin. I infiltrated marcaine and then made a curvinlinear incision overlying the seed. I then used the neoprobe to guide excision of the seed and the surrounding tissue. Mammogram confirmed removal of the seed and the clip.This was then all sent to pathology. Hemostasis was observed. I closed the breast tissue with a 2-0 Vicryl. The dermis was closed with 3-0 Vicryl and the skin with 4-0 Monocryl.Dermabond and steristrips were placed on the incision. A breast binder was placed. She was transferred to recovery stable

## 2018-04-30 NOTE — Anesthesia Preprocedure Evaluation (Signed)
Anesthesia Evaluation  Patient identified by MRN, date of birth, ID band Patient awake    Reviewed: Allergy & Precautions, NPO status , Patient's Chart, lab work & pertinent test results  History of Anesthesia Complications (+) PONV  Airway Mallampati: I       Dental no notable dental hx. (+) Teeth Intact   Pulmonary sleep apnea and Continuous Positive Airway Pressure Ventilation ,    Pulmonary exam normal breath sounds clear to auscultation       Cardiovascular hypertension, Pt. on medications Normal cardiovascular exam Rhythm:Regular Rate:Normal     Neuro/Psych negative psych ROS   GI/Hepatic hiatal hernia, GERD  Medicated and Controlled,  Endo/Other  Hypothyroidism   Renal/GU   negative genitourinary   Musculoskeletal negative musculoskeletal ROS (+)   Abdominal (+) + obese,   Peds  Hematology negative hematology ROS (+)   Anesthesia Other Findings   Reproductive/Obstetrics negative OB ROS                             Anesthesia Physical Anesthesia Plan  ASA: II  Anesthesia Plan: General   Post-op Pain Management:    Induction:   PONV Risk Score and Plan: 4 or greater and Ondansetron, Dexamethasone and Scopolamine patch - Pre-op  Airway Management Planned: LMA  Additional Equipment:   Intra-op Plan:   Post-operative Plan:   Informed Consent: I have reviewed the patients History and Physical, chart, labs and discussed the procedure including the risks, benefits and alternatives for the proposed anesthesia with the patient or authorized representative who has indicated his/her understanding and acceptance.   Dental advisory given  Plan Discussed with: CRNA and Surgeon  Anesthesia Plan Comments:         Anesthesia Quick Evaluation

## 2018-04-30 NOTE — Anesthesia Procedure Notes (Signed)
Procedure Name: LMA Insertion Date/Time: 04/30/2018 9:57 AM Performed by: Cleda Daub, CRNA Pre-anesthesia Checklist: Patient identified, Emergency Drugs available, Suction available and Patient being monitored Patient Re-evaluated:Patient Re-evaluated prior to induction Oxygen Delivery Method: Circle system utilized Preoxygenation: Pre-oxygenation with 100% oxygen Induction Type: IV induction LMA: LMA inserted LMA Size: 5.0 Tube type: Oral Airway Equipment and Method: Patient positioned with wedge pillow Placement Confirmation: ETT inserted through vocal cords under direct vision,  positive ETCO2 and breath sounds checked- equal and bilateral Tube secured with: Tape Dental Injury: Teeth and Oropharynx as per pre-operative assessment

## 2018-04-30 NOTE — H&P (Signed)
57 yof referred by Dr Autumn Patty for right breast lesion on mm. she works as Therapist, sports at [employer]. she has no personal history of breast disease. has no fh of breast or ovarian cancer. she does not have mass or dc. she underwent screening mm that shows b density breasts. she has several abnormalities many of which end up being cysts on [place]. US shows numerous small cysts in lower inner and lower outer right breast the largest being about 6 mm. there is ruoq distortion that has no [place] correlate. there is no axillary adenopathy on [place]. core biopsy was done and is a csl.  she has hx of factor V Leiden mutation ([place]).   Past Surgical History  Breast Biopsy  Right. Cesarean Section - 1  Open Inguinal Hernia Surgery  Bilateral. Oral Surgery  Tonsillectomy   Diagnostic Studies History  Colonoscopy  1-5 years ago Mammogram  within last year Pap Smear  1-5 years ago  Allergies  Codeine/Codeine Derivatives  Allergies Reconciled   Medication History  Synthroid (50MCG Tablet, Oral) Active. Lisinopril (20MG  Tablet, Oral) Active. ValACYclovir HCl (1GM Tablet, Oral) Active. Protonix (40MG  Tablet DR, Oral) Active.  Social History  Alcohol use  Occasional alcohol use. Caffeine use  Carbonated beverages, Tea. No drug use  Tobacco use  Never smoker.  Family History Alcohol Abuse  Father. Arthritis  Mother. Cerebrovascular Accident  Brother. Colon Polyps  Sister. Heart Disease  Brother, Mother. Hypertension  Brother, Father, Mother. Kidney Disease  Mother. Respiratory Condition  Father. Thyroid problems  Sister.  Pregnancy / Birth History  Age at menarche  67 years. Age of menopause  <47 Gravida  1 Irregular periods  Length (months) of breastfeeding  3-6 Maternal age  30-25 Para  1  Other Problems Back Pain  Gastroesophageal Reflux Disease  Hemorrhoids  High blood pressure  Hypercholesterolemia  Kidney Stone  Lump In Breast   Migraine Headache  Other disease, cancer, significant illness  Sleep Apnea  Thyroid Disease    Review of Systems General Not Present- Appetite Loss, Chills, Fatigue, Fever, Night Sweats, Weight Gain and Weight Loss. Skin Not Present- Change in Wart/Mole, Dryness, Hives, Jaundice, New Lesions, Non-Healing Wounds, Rash and Ulcer. HEENT Present- Wears glasses/contact lenses. Not Present- Earache, Hearing Loss, Hoarseness, Nose Bleed, Oral Ulcers, Ringing in the Ears, Seasonal Allergies, Sinus Pain, Sore Throat, Visual Disturbances and Yellow Eyes. Respiratory Not Present- Bloody sputum, Chronic Cough, Difficulty Breathing, Snoring and Wheezing. Breast Present- Breast Mass. Not Present- Breast Pain, Nipple Discharge and Skin Changes. Cardiovascular Not Present- Chest Pain, Difficulty Breathing Lying Down, Leg Cramps, Palpitations, Rapid Heart Rate, Shortness of Breath and Swelling of Extremities. Gastrointestinal Present- Hemorrhoids and Indigestion. Not Present- Abdominal Pain, Bloating, Bloody Stool, Change in Bowel Habits, Chronic diarrhea, Constipation, Difficulty Swallowing, Excessive gas, Gets full quickly at meals, Nausea, Rectal Pain and Vomiting. Female Genitourinary Not Present- Frequency, Nocturia, Painful Urination, Pelvic Pain and Urgency. Musculoskeletal Present- Muscle Pain. Not Present- Back Pain, Joint Pain, Joint Stiffness, Muscle Weakness and Swelling of Extremities. Neurological Present- Headaches. Not Present- Decreased Memory, Fainting, Numbness, Seizures, Tingling, Tremor, Trouble walking and Weakness. Psychiatric Not Present- Anxiety, Bipolar, Change in Sleep Pattern, Depression, Fearful and Frequent crying. Endocrine Present- Hot flashes. Not Present- Cold Intolerance, Excessive Hunger, Hair Changes, Heat Intolerance and New Diabetes. Hematology Not Present- Blood Thinners, Easy Bruising, Excessive bleeding, Gland problems, HIV and Persistent  Infections.  Vitals Weight: 224.38 lb Height: 66in Body Surface Area: 2.1 m Body Mass Index: 36.21 kg/m  Temp.: 98.60F(Oral)  Pulse: 82 (Regular)  BP: 146/82 (Sitting, Left Arm, Standard) Physical Exam  General Mental Status-Alert. Head and Neck Trachea-midline. Thyroid Gland Characteristics - normal size and consistency. Eye Sclera/Conjunctiva - Bilateral-No scleral icterus. Chest and Lung Exam Chest and lung exam reveals -quiet, even and easy respiratory effort with no use of accessory muscles and on auscultation, normal breath sounds, no adventitious sounds and normal vocal resonance. Breast Nipples-No Discharge. Breast Lump-No Palpable Breast Mass. Cardiovascular Cardiovascular examination reveals -normal heart sounds, regular rate and rhythm with no murmurs. Neuropsychiatric Mental status exam performed with findings of-Oriented X3 with appropriate mood and affect. Lymphatic Head & Neck General Head & Neck Lymphatics: Bilateral - Description - Normal. Axillary General Axillary Region: Bilateral - Description - Normal. Note: no [place] adenopathy   Assessment & Plan  RADIAL SCAR OF BREAST (S01.09) Story: Right breast seed guided excisional biopsy I will touch base with Dr Beryle Beams prior to surgery about factor V Leiden. I will keep her on asa around surgery at minimum. we discussed options today of observation vs surgery. discussed pros and cons of both. we will proceed with seed guided excision due to small risk of upgrading this lesion. risks and recovery discussed.

## 2018-04-30 NOTE — Interval H&P Note (Signed)
History and Physical Interval Note:  04/30/2018 8:40 AM  Yvonne Wyatt  has presented today for surgery, with the diagnosis of abnormal mammogram  The various methods of treatment have been discussed with the patient and family. After consideration of risks, benefits and other options for treatment, the patient has consented to  Procedure(s): RIGHT RADIOACTIVE SEED GUIDED EXCISIONAL BREAST BIOPSY ERAS PATHWAY (Right) as a surgical intervention .  The patient's history has been reviewed, patient examined, no change in status, stable for surgery.  I have reviewed the patient's chart and labs.  Questions were answered to the patient's satisfaction.     Rolm Bookbinder

## 2018-04-30 NOTE — Discharge Instructions (Signed)
[facility] Office Phone Number [phone]  POST OP INSTRUCTIONS Take tylenol 650 mg every 6 hours or ibuprofen 400 mg every 8 hours for next three days and then as needed. Apply ice today and twice daily for next three days.   Always review your discharge instruction sheet given to you by the facility where your surgery was performed.  IF YOU HAVE DISABILITY OR FAMILY LEAVE FORMS, YOU MUST BRING THEM TO THE OFFICE FOR PROCESSING.  DO NOT GIVE THEM TO YOUR DOCTOR.  1. A prescription for pain medication may be given to you upon discharge.  Take your pain medication as prescribed, if needed.  If narcotic pain medicine is not needed, then you may take acetaminophen (Tylenol), naprosyn (Alleve) or ibuprofen (Advil) as needed. 2. Take your usually prescribed medications unless otherwise directed 3. If you need a refill on your pain medication, please contact your pharmacy.  They will contact our office to request authorization.  Prescriptions will not be filled after 5pm or on week-ends. 4. You should eat very light the first 24 hours after surgery, such as soup, crackers, pudding, etc.  Resume your normal diet the day after surgery. 5. Most patients will experience some swelling and bruising in the breast.  Ice packs and a good support bra will help.  Wear the breast binder provided or a sports bra for 72 hours day and night.  After that wear a sports bra during the day until you return to the office. Swelling and bruising can take several days to resolve.  6. It is common to experience some constipation if taking pain medication after surgery.  Increasing fluid intake and taking a stool softener will usually help or prevent this problem from occurring.  A mild laxative (Milk of Magnesia or Miralax) should be taken according to package directions if there are no bowel movements after 48 hours. 7. Unless discharge instructions indicate otherwise, you may remove your bandages 48 hours  after surgery and you may shower at that time.  You may have steri-strips (small skin tapes) in place directly over the incision.  These strips should be left on the skin for 7-10 days and will come off on their own.  If your surgeon used skin glue on the incision, you may shower in 24 hours.  The glue will flake off over the next 2-3 weeks.  Any sutures or staples will be removed at the office during your follow-up visit. 8. ACTIVITIES:  You may resume regular daily activities (gradually increasing) beginning the next day.  Wearing a good support bra or sports bra minimizes pain and swelling.  You may have sexual intercourse when it is comfortable. a. You may drive when you no longer are taking prescription pain medication, you can comfortably wear a seatbelt, and you can safely maneuver your car and apply brakes. b. RETURN TO WORK:  ______________________________________________________________________________________ 9. You should see your doctor in the office for a follow-up appointment approximately two weeks after your surgery.  Your doctors nurse will typically make your follow-up appointment when she calls you with your pathology report.  Expect your pathology report 3-4 business days after your surgery.  You may call to check if you do not hear from [place] after three days. 10. OTHER INSTRUCTIONS: _______________________________________________________________________________________________ _____________________________________________________________________________________________________________________________________ _____________________________________________________________________________________________________________________________________ _____________________________________________________________________________________________________________________________________  WHEN TO CALL DR Orah Sonnen: 1. Fever over 101.0 2. Nausea and/or vomiting. 3. Extreme swelling or  bruising. 4. Continued bleeding from incision. 5. Increased pain, redness, or drainage from the incision.  The clinic staff is available to answer your questions during regular business hours.  Please dont hesitate to call and ask to speak to one of the nurses for clinical concerns.  If you have a medical emergency, go to the nearest emergency room or call 911.  A surgeon from [facility] is always on call at the hospital.  For further questions, please visit [email or web address]

## 2018-04-30 NOTE — Transfer of Care (Signed)
Immediate Anesthesia Transfer of Care Note  Patient: Yvonne Wyatt  Procedure(s) Performed: RIGHT RADIOACTIVE SEED GUIDED EXCISIONAL BREAST BIOPSY ERAS PATHWAY (Right Breast)  Patient Location: PACU  Anesthesia Type:General  Level of Consciousness: awake, alert , oriented and patient cooperative  Airway & Oxygen Therapy: Patient Spontanous Breathing and Patient connected to face mask oxygen  Post-op Assessment: Report given to RN and Post -op Vital signs reviewed and stable  Post vital signs: Reviewed and stable  Last Vitals:  Vitals Value Taken Time  BP 157/89 04/30/2018  9:40 AM  Temp    Pulse 106 04/30/2018  9:42 AM  Resp 22 04/30/2018  9:42 AM  SpO2 100 % 04/30/2018  9:42 AM  Vitals shown include unvalidated device data.  Last Pain:  Vitals:   04/30/18 0721  TempSrc: Oral         Complications: No apparent anesthesia complications

## 2018-05-01 ENCOUNTER — Encounter (HOSPITAL_COMMUNITY): Payer: Self-pay | Admitting: General Surgery

## 2018-05-03 ENCOUNTER — Encounter: Payer: Self-pay | Admitting: Radiation Oncology

## 2018-05-04 ENCOUNTER — Encounter: Payer: Self-pay | Admitting: Oncology

## 2018-05-04 ENCOUNTER — Telehealth: Payer: Self-pay | Admitting: Oncology

## 2018-05-04 NOTE — Telephone Encounter (Signed)
New referral received from Dr. Donne Hazel at [facility] for new breast cancer. Pt has been scheduled to see Dr. Jana Hakim on 7/8 at 4pm. I offered the pt an earlier appt but there were conflicts. Letter mailed to the pt.

## 2018-05-04 NOTE — Progress Notes (Signed)
Location of Breast Cancer: Right Breast  Histology per Pathology Report:  03/19/18 Diagnosis Breast, right, needle core biopsy, upper, outer quadrant - COMPLEX SCLEROSING LESION WITH USUAL DUCTAL HYPERPLASIA. - FIBROCYSTIC CHANGES WITH USUAL DUCTAL HYPERPLASIA.  04/30/18 Diagnosis Breast, lumpectomy, Right w/seed - EXTENSIVE DUCTAL CARCINOMA IN SITU (DCIS), INTERMEDIATE NUCLEAR GRADE WITH EXTENSION TO MULTIPLE FOCI OF SCLEROSING ADENOSIS AND COMPLEX SCLEROSING LESIONS. SEE NOTE. - NEGATIVE FOR INVASIVE CARCINOMA. - ALL RESECTION MARGINS ARE NEGATIVE FOR DCIS; CLOSEST IS THE ANTERIOR MARGIN AT 1.5 MM. BOTH MEDIAL 1 of 3 FINAL for Yvonne Wyatt, Yvonne Wyatt [phone]) Diagnosis(continued) AND LATERAL MARGINS ARE AT 3 MM. - MICROCALCIFICATIONS ARE IDENTIFIED. - BIOPSY SITE CHANGES.  Receptor Status: ER(100%), PR (90%)  Did patient present with symptoms or was this found on screening mammography?: It was discovered on a screening mammogram.   Past/Anticipated interventions by surgeon, if any: 04/30/18 Procedure:Rightbreastseed guided excisional biopsy Surgeon: Dr. Serita Grammes   Past/Anticipated interventions by medical oncology, if any:  05/28/18 Dr. Jana Hakim  Lymphedema issues, if any:  She denies. She reports good arm mobility.   Pain issues, if any:  She reports soreness from surgery to right breast.   SAFETY ISSUES:  Prior radiation? No  Pacemaker/ICD? No  Possible current pregnancy?: No  Is the patient on methotrexate? No  Current Complaints / other details:    BP 131/78   Pulse 75   Temp 98.6 F (37 C)   Resp 18   Ht 5' 6.25" (1.683 m)   Wt 221 lb 3.2 oz (100.3 kg)   SpO2 95% Comment: room air  BMI 35.43 kg/m    Wt Readings from Last 3 Encounters:  05/15/18 221 lb 3.2 oz (100.3 kg)  05/15/18 221 lb 3.2 oz (100.3 kg)  04/25/18 224 lb 1.6 oz (101.7 kg)      [name], [name], RN 05/04/2018,9:02 AM

## 2018-05-15 ENCOUNTER — Other Ambulatory Visit: Payer: Self-pay

## 2018-05-15 ENCOUNTER — Ambulatory Visit
Admission: RE | Admit: 2018-05-15 | Discharge: 2018-05-15 | Disposition: A | Discharge status: Home / Self Care | Payer: 59 | Source: Ambulatory Visit | Attending: Radiation Oncology | Admitting: Radiation Oncology

## 2018-05-15 ENCOUNTER — Encounter: Payer: Self-pay | Admitting: Radiation Oncology

## 2018-05-15 ENCOUNTER — Ambulatory Visit: Payer: 59 | Admitting: Radiation Oncology

## 2018-05-15 VITALS — BP 131/78 | HR 75 | Temp 98.6°F | Resp 18 | Wt 221.2 lb

## 2018-05-15 DIAGNOSIS — E039 Hypothyroidism, unspecified: Secondary | ICD-10-CM | POA: Diagnosis not present

## 2018-05-15 DIAGNOSIS — Z79899 Other long term (current) drug therapy: Secondary | ICD-10-CM | POA: Insufficient documentation

## 2018-05-15 DIAGNOSIS — Z8249 Family history of ischemic heart disease and other diseases of the circulatory system: Secondary | ICD-10-CM | POA: Insufficient documentation

## 2018-05-15 DIAGNOSIS — I10 Essential (primary) hypertension: Secondary | ICD-10-CM | POA: Insufficient documentation

## 2018-05-15 DIAGNOSIS — K219 Gastro-esophageal reflux disease without esophagitis: Secondary | ICD-10-CM | POA: Insufficient documentation

## 2018-05-15 DIAGNOSIS — Z823 Family history of stroke: Secondary | ICD-10-CM | POA: Diagnosis not present

## 2018-05-15 DIAGNOSIS — M8580 Other specified disorders of bone density and structure, unspecified site: Secondary | ICD-10-CM | POA: Diagnosis not present

## 2018-05-15 DIAGNOSIS — Z7982 Long term (current) use of aspirin: Secondary | ICD-10-CM | POA: Insufficient documentation

## 2018-05-15 DIAGNOSIS — D0511 Intraductal carcinoma in situ of right breast: Secondary | ICD-10-CM

## 2018-05-15 DIAGNOSIS — K449 Diaphragmatic hernia without obstruction or gangrene: Secondary | ICD-10-CM | POA: Diagnosis not present

## 2018-05-15 DIAGNOSIS — Z170 Estrogen receptor positive status [ER+]: Secondary | ICD-10-CM | POA: Diagnosis not present

## 2018-05-15 DIAGNOSIS — Z885 Allergy status to narcotic agent status: Secondary | ICD-10-CM | POA: Diagnosis not present

## 2018-05-15 DIAGNOSIS — G4730 Sleep apnea, unspecified: Secondary | ICD-10-CM | POA: Diagnosis not present

## 2018-05-15 DIAGNOSIS — D6851 Activated protein C resistance: Secondary | ICD-10-CM | POA: Insufficient documentation

## 2018-05-15 NOTE — Progress Notes (Signed)
Radiation Oncology         [phone] ________________________________  Initial Outpatient Consultation  Name: Yvonne Wyatt MRN: [id number]  Date: 05/15/2018  DOB: 02-16-1961  WU:[id number], Gwyndolyn Saxon, MD  Rolm Bookbinder, MD   REFERRING PHYSICIAN: Rolm Bookbinder, MD  DIAGNOSIS:    ICD-10-CM   1. Ductal carcinoma in situ (DCIS) of right breast D05.11   Cancer Staging Ductal carcinoma in situ (DCIS) of right breast Staging form: Breast, AJCC 8th Edition - Clinical: No stage assigned - Unsigned - Pathologic: Stage 0 (pTis (DCIS), pN0, cM0, ER+, PR+, HER2: Not Assessed) - Signed by Eppie Gibson, MD on 05/15/2018 DCIS is intermediate grade   CHIEF COMPLAINT: Here to discuss management of right breast DCIS  HISTORY OF PRESENT ILLNESS::Yvonne Wyatt is a 57 y.o. female who presented with breast abnormality on the following imaging: Digital Bilateral breast mammogram with TOMO and CAD on the date of 03/14/2018, recommending further evaluation.   On 03/19/2018 she had a Digital Diagnostic bilateral breast mammogram with CAD and TOMO, breast density category B, showing persistent distortion in the UOQ of the right breast without sonographic correlate. No adenopathy by u/s. Numerous bilateral simple cysts. This led to her undergoing a right UOQ breast needle core biopsy on the same day. Showing complex sclerosing lesion with usual ductal hyperplasia. Fibrocystic changes with usual ductal hyperplasia.   On 04/30/2018 she underwent a right seed breast lumpectomy showing extensive ductal carcinoma IN-SITU (DCIS), intermediate nuclear grade with extension to multiple foci of sclerosing adenosis and complex sclerosing lesions. Negative for invasive carcinoma. All resection margins are negative for DCIS; closest is the anterior margin at 1.5 mm. Both medial and lateral margins are at 3 mm. ER status: 100% positive, strong staining intensity; PR status 90% positive, strong staining intensity;  Intermediate Grade.  She currently is experiencing pain to her right breast due to her recent surgery.  She stopped estradiol cream before surgery and had prior issues with pain with sexual intercourse - interested in other ways to prevent this.  She has an appointment with Dr. Jana Hakim scheduled for 05/28/2018  PREVIOUS RADIATION THERAPY: No  PAST MEDICAL HISTORY:  has a past medical history of Complication of anesthesia, Dyspareunia (05/20/2014), Fatigue, GERD (gastroesophageal reflux disease), Headache, Heterozygous factor V Leiden mutation ([facility]) (05/20/2014), History of hiatal hernia, Hypertension, Hypothyroidism, Osteopenia, PONV (postoperative nausea and vomiting), and Sleep apnea.    PAST SURGICAL HISTORY: Past Surgical History:  Procedure Laterality Date  . CESAREAN SECTION    . DIAGNOSTIC LAPAROSCOPY     for endometriosis  . GANGLION CYST EXCISION Right    right wrist  . INGUINAL HERNIA REPAIR     x 2 , at 18 months and 57 years old.   [name] RADIOACTIVE SEED GUIDED EXCISIONAL BREAST BIOPSY Right 04/30/2018   Procedure: RIGHT RADIOACTIVE SEED GUIDED EXCISIONAL BREAST BIOPSY ERAS PATHWAY;  Surgeon: Rolm Bookbinder, MD;  Location: [facility];  Service: General;  Laterality: Right;  . SINUS EXPLORATION  1990    FAMILY HISTORY: family history includes AAA (abdominal aortic aneurysm) in her mother; CAD in her mother; COPD in her father and mother; CVA in her brother; Deep vein thrombosis in her mother; Dementia in her mother; Diabetes in her brother; Hyperlipidemia in her brother and mother.  SOCIAL HISTORY:  reports that she has never smoked. She has never used smokeless tobacco. She reports that she drinks alcohol. She reports that she does not use drugs.  ALLERGIES: Codeine  MEDICATIONS:  Current Outpatient Medications  Medication Sig Dispense Refill  . Acetaminophen (TYLENOL EXTRA STRENGTH PO) Take 1,000 mg by mouth daily as needed (headache/pain).     [name] aspirin EC 81 MG tablet Take 81  mg by mouth daily.    . Calcium Citrate (CITRACAL PO) Take 2 tablets by mouth daily.     [name] escitalopram (LEXAPRO) 10 MG tablet Take 10 mg by mouth at bedtime.   6  . FOLIC ACID PO Take 3,662 mcg by mouth daily.     . Hydrocortisone (PROCTOSOL HC RE) Place 1 application rectally 2 (two) times daily as needed (hemmorrhoids).    [name] levothyroxine (SYNTHROID, LEVOTHROID) 50 MCG tablet Take 50 mcg by mouth daily before breakfast.    . lisinopril (PRINIVIL,ZESTRIL) 20 MG tablet Take 20 mg by mouth daily.  11  . Lysine 500 MG TABS Take 500 mg by mouth daily.     . Melatonin 3 MG CAPS Take 3 mg by mouth at bedtime.     . Multiple Vitamin (MULTIVITAMIN WITH MINERALS) TABS tablet Take 1 tablet by mouth daily.    [name] nystatin-triamcinolone ointment (MYCOLOG) Apply 1 application topically 2 (two) times daily as needed.    . pantoprazole (PROTONIX) 40 MG tablet Take 40 mg by mouth daily.    . Probiotic Product (ALIGN PO) Take 1 tablet by mouth daily.     . ranitidine (ZANTAC) 150 MG tablet Take 150 mg by mouth 2 (two) times daily.    . valACYclovir (VALTREX) 1000 MG tablet TAKE 1/2 TABLET DAILY FOR SUPPRESSION  5  . Vitamin D, Ergocalciferol, 2000 units CAPS Take 2,000 Units by mouth daily.      No current facility-administered medications for this encounter.     REVIEW OF SYSTEMS: sd sbovr  PHYSICAL EXAM:  height is 5' 6.25" (1.683 m) and weight is 221 lb 3.2 oz (100.3 kg). Her temperature is 98.6 F (37 C). Her blood pressure is 131/78 and her pulse is 75. Her respiration is 18 and oxygen saturation is 95%.   General: Alert and oriented, in no acute distress HEENT: Head is normocephalic. Extraocular movements are intact. Oropharynx is clear. Neck: Neck is supple, no palpable cervical or supraclavicular lymphadenopathy. Heart: Regular in rate and rhythm with no murmurs, rubs, or gallops. Chest: Clear to auscultation bilaterally, with no rhonchi, wheezes, or rales. Abdomen: Soft, nontender, nondistended,  with no rigidity or guarding. Extremities: No cyanosis or edema. Lymphatics: see Neck Exam Skin: No concerning lesions. Musculoskeletal: symmetric strength and muscle tone throughout. Neurologic: Cranial nerves II through XII are grossly intact. No obvious focalities. Speech is fluent. Coordination is intact. Psychiatric: Judgment and insight are intact. Affect is appropriate. Breasts: Right breast lumpectomy scar is healing well . No other palpable masses appreciated in the breasts or axillae.  ECOG = 0  0 - Asymptomatic (Fully active, able to carry on all predisease activities without restriction)  1 - Symptomatic but completely ambulatory (Restricted in physically strenuous activity but ambulatory and able to carry out work of a light or sedentary nature. For example, light housework, office work)  2 - Symptomatic, <50% in bed during the day (Ambulatory and capable of all self care but unable to carry out any work activities. Up and about more than 50% of waking hours)  3 - Symptomatic, >50% in bed, but not bedbound (Capable of only limited self-care, confined to bed or chair 50% or more of waking hours)  4 - Bedbound (Completely disabled. Cannot carry on any self-care. Totally confined to  bed or chair)  5 - Death   Eustace Pen MM, Creech RH, Tormey DC, et al. [phone]). "Toxicity and response criteria of the [facility]". [place] Oncol. 5 (6): 649-55   LABORATORY DATA:  Lab Results  Component Value Date   WBC 4.8 04/25/2018   HGB 12.4 04/25/2018   HCT 37.6 04/25/2018   MCV 95.4 04/25/2018   PLT 244 04/25/2018   CMP     Component Value Date/Time   NA 140 04/25/2018 1106   NA 140 05/02/2006 1027   K 3.8 04/25/2018 1106   K 4.0 05/02/2006 1027   CL 108 04/25/2018 1106   CL 104 05/02/2006 1027   CO2 25 04/25/2018 1106   CO2 22 05/02/2006 1027   GLUCOSE 90 04/25/2018 1106   GLUCOSE 99 05/02/2006 1027   BUN 11 04/25/2018 1106   BUN 12 05/02/2006 1027     CREATININE 0.98 04/25/2018 1106   CREATININE 0.9 05/02/2006 1027   CALCIUM 9.2 04/25/2018 1106   CALCIUM 9.6 05/02/2006 1027   GFRNONAA >60 04/25/2018 1106   GFRAA >60 04/25/2018 1106         RADIOGRAPHY: Mm Breast Surgical Specimen  Result Date: 04/30/2018 CLINICAL DATA:  Evaluate surgical specimen following excision of RIGHT breast complex sclerosing lesion. EXAM: SPECIMEN RADIOGRAPH OF THE RIGHT BREAST COMPARISON:  Previous exam(s). FINDINGS: Status post excision of the RIGHT breast. The radioactive seed and biopsy marker clip are present, completely intact, and were marked for pathology. IMPRESSION: Specimen radiograph of the RIGHT breast. Electronically Signed   By: Margarette Canada M.D.   On: 04/30/2018 10:00   Mm Rt Radioactive Seed Loc Mammo Guide  Result Date: 04/26/2018 CLINICAL DATA:  Patient presents for radioactive seed localization of biopsy-proven complex sclerosing lesion upper outer right breast marked by coil shaped metallic clip. EXAM: MAMMOGRAPHIC GUIDED RADIOACTIVE SEED LOCALIZATION OF THE RIGHT BREAST COMPARISON:  Previous exam(s). FINDINGS: Patient presents for radioactive seed localization prior to surgical excision. I met with the patient and we discussed the procedure of seed localization including benefits and alternatives. We discussed the high likelihood of a successful procedure. We discussed the risks of the procedure including infection, bleeding, tissue injury and further surgery. We discussed the low dose of radioactivity involved in the procedure. Informed, written consent was given. The usual time-out protocol was performed immediately prior to the procedure. Using mammographic guidance, sterile technique, 1% lidocaine and an I-125 radioactive seed, the targeted coil shaped metallic clip was localized using a superior to inferior approach. The follow-up mammogram images confirm the seed in the expected location and were marked for Dr. Donne Hazel. The seed lies 3-4 mm  posterolateral to the clip on the CC image and 2-3 mm posterior to the clip on the lateral image. Follow-up survey of the patient confirms presence of the radioactive seed. Order number of I-125 seed:  [id number]. Total activity:  1.191 millicurie reference Date: 03/27/2018 The patient tolerated the procedure well and was released from the [facility]. She was given instructions regarding seed removal. IMPRESSION: Radioactive seed localization right breast. No apparent complications. Electronically Signed   By: Marin Olp M.D.   On: 04/26/2018 14:40      IMPRESSION/PLAN: DCIS, right breast  It was a pleasure meeting the patient today. We discussed the risks, benefits, and side effects of radiotherapy. I recommend radiotherapy to the right breast to reduce her risk of locoregional recurrence by half.  We discussed that radiation would take approximately 4 weeks to  complete. We spoke about acute effects including skin irritation and fatigue as well as much less common late effects including internal organ injury or irritation. We spoke about the latest technology that is used to minimize the risk of late effects for patients undergoing radiotherapy to the breast or chest wall. No guarantees of treatment were given. The patient is enthusiastic about proceeding with treatment. I look forward to participating in the patient's care.  I will await her referral back to me for postoperative follow-up and eventual CT simulation/treatment planning.  We will work around her upcoming vacation and will simulate during the week of 06/04/2018.    She stopped estradiol cream before surgery and had prior issues with pain with sexual intercourse - interested in other ways to prevent this. Will refer to PT - pelvic therapy specialist.   I spent at least 35 minutes face to face with the patient and more than 50% of that time was spent in counseling and/or coordination of care.    __________________________________________   Eppie Gibson, MD  This document serves as a record of services personally performed by Eppie Gibson, MD. It was created on her behalf by Margit Banda, a trained medical scribe. The creation of this record is based on the scribe's personal observations and the provider's statements to them. This document has been checked and approved by the attending provider.

## 2018-05-20 DIAGNOSIS — G4733 Obstructive sleep apnea (adult) (pediatric): Secondary | ICD-10-CM | POA: Diagnosis not present

## 2018-05-25 ENCOUNTER — Other Ambulatory Visit: Payer: Self-pay

## 2018-05-25 DIAGNOSIS — D0511 Intraductal carcinoma in situ of right breast: Secondary | ICD-10-CM

## 2018-05-27 NOTE — Progress Notes (Signed)
[place]  [phone] [phone]     ID: Yvonne Wyatt DOB: [birth year]  MR#: [id number]  PIR#:[id number]  Patient Care Team: Yvonne Wyatt as PCP - General (Internal Medicine) Yvonne Wyatt as Consulting Physician (General Surgery) Yvonne Wyatt as Attending Physician (Radiation Oncology) Yvonne Wyatt as Consulting Physician (Oncology) OTHER Wyatt:  CHIEF COMPLAINT: Estrogen receptor positive ductal carcinoma in situ  CURRENT TREATMENT: Adjuvant radiation pending   HISTORY OF CURRENT ILLNESS: Yvonne Wyatt on 03/14/2018 showing a possible abnormality in both breasts. She underwent bilateral diagnostic Wyatt with tomography and bilateral breast ultrasonography at [facility] on 03/19/2018 showing: breast density category B,  Persistent distortion in the Upper-Outer Quadrant of the Right breast without sonographic correlate. No axillary adenopathy by ultrasound. Numerous bilateral simple breast cysts. Stereotactic guided core biopsy was recommended.  Accordingly on 03/19/2018 she proceeded to biopsy of the right breast area in question. The pathology from this procedure showed ([id number]): complex sclerosing lesion with ductal hyperplasia. Fibrocystic changes with usual ductal hyperplasia.   She was then referred to surgery and after appropriate discussion on 04/30/2018 she underwent a right breast lumpectomy, showing extensive ductal carcinoma IN SITU (DCIS), intermediate nuclear grade with extension to multiple foci of sclerosing adenosis and complex sclerosing lesions. Negative for invasive carcinoma. All resection margins are negative for DCIS; closest is the anterior margin at 1.5 mm. Both medial and lateral margins are at 3 mm.  The prognostic panel showed the estrogen receptor at 100%, with strong staining intensity, estrogen receptor 90% positive, strong staining intensity. Stage 0  (pTis, pNX)  The patient's subsequent history is as detailed below.  INTERVAL HISTORY: Yvonne Wyatt on 05/28/18 accompanied by her husband, Yvonne Wyatt. Her case was also presented at the multidisciplinary breast cancer conference 05/16/2018. At that time a preliminary plan was proposed: 4 weeks of adjuvant radiation, followed by discussion regarding antiestrogens.   REVIEW OF SYSTEMS: There were no specific symptoms leading to the original mammogram, which was routinely scheduled. She tolerated her surgery well.  She has had other surgeries in the past with no clotting complications.  She also took oral contraceptives for many years again with no history of clotting.  She did pass a thrombose clot 13 weeks after a c-section.  Otherwise she denies unusual headaches, visual changes, nausea, vomiting, stiff neck, dizziness, or gait imbalance. There has been no cough, phlegm production, or pleurisy, no chest pain or pressure, and no change in bowel or bladder habits. The patient denies fever, rash, bleeding, unexplained fatigue or unexplained weight loss. A detailed review of systems was otherwise entirely negative.   PAST MEDICAL HISTORY: Past Medical History:  Diagnosis Date  . Complication of anesthesia   . Dyspareunia 05/20/2014   Early menopause age 31  . Fatigue   . GERD (gastroesophageal reflux disease)   . Headache   . Heterozygous factor V Leiden mutation ([place]) 05/20/2014   Dx 2007  No personal hx of thrombosis;  mother DVT age 5 on OC  . History of hiatal hernia   . Hypertension   . Hypothyroidism   . Osteopenia   . PONV (postoperative nausea and vomiting)   . Sleep apnea     PAST SURGICAL HISTORY: Past Surgical History:  Procedure Laterality Date  . CESAREAN SECTION    . DIAGNOSTIC LAPAROSCOPY     for endometriosis  . GANGLION  CYST EXCISION Right    right wrist  . INGUINAL HERNIA REPAIR     x 2 , at 18 months and 57  years old.   [name] RADIOACTIVE SEED GUIDED EXCISIONAL BREAST BIOPSY Right 04/30/2018   Procedure: RIGHT RADIOACTIVE SEED GUIDED EXCISIONAL BREAST BIOPSY ERAS PATHWAY;  Surgeon: Yvonne Wyatt;  Location: [facility];  Service: General;  Laterality: Right;  . SINUS EXPLORATION  1990    FAMILY HISTORY Family History  Problem Relation Age of Onset  . CAD Mother   . Deep vein thrombosis Mother   . Hyperlipidemia Mother   . COPD Mother   . AAA (abdominal aortic aneurysm) Mother   . Dementia Mother   . COPD Father   . CVA Brother   . Diabetes Brother   . Hyperlipidemia Brother   The patient's father died at the age of 32 from emphysema.  The patient's mother is still living as of July 2019, age 72.  She had a history of superficial clots and some cardiovascular disease.  The patient had one brother who died at age 63 from a stroke.  The patient had 2 sisters.  There is no history of breast cancer ovarian cancer or other clotting history in the family to her knowledge  GYNECOLOGIC HISTORY:  No LMP recorded. Patient is postmenopausal. Menarche: 57 years old Age at first live birth: 57 years old [place] P 1 LMP was at 57 years old Took oral contraceptives for many years (history of endometriosis) HRT no  Hysterectomy? No  BSO? No  SOCIAL HISTORY: (As of July 2019) She is a Marine scientist at [employer]. Her husband, Yvonne Wyatt, builds Medical sales representative for [employer]. Their son Yvonne Wyatt, 30, is a Engineer, building services. Yvonne Wyatt has two children from a pervious marriage, Yvonne Wyatt, who lives in [place], and Yvonne Wyatt, lives in [place].  There are no grandchildren.  [name] attends a [employer].    ADVANCED DIRECTIVES: The patient's husband is her healthcare power of attorney   HEALTH MAINTENANCE: Social History   Tobacco Use  . Smoking status: Never Smoker  . Smokeless tobacco: Never Used  Substance Use Topics  . Alcohol use: Yes    Comment: occasional  . Drug use: No     Colonoscopy: UTD/ [facility]  PAP: UTD/ [facility]  Bone  density: 04/25/2016, t-score of -0.9   Allergies  Allergen Reactions  . Codeine Hives and Rash    Current Outpatient Medications  Medication Sig Dispense Refill  . Acetaminophen (TYLENOL EXTRA STRENGTH PO) Take 1,000 mg by mouth daily as needed (headache/pain).     [name] aspirin EC 81 MG tablet Take 81 mg by mouth daily.    . Calcium Citrate (CITRACAL PO) Take 2 tablets by mouth daily.     [name] escitalopram (LEXAPRO) 10 MG tablet Take 10 mg by mouth at bedtime.   6  . FOLIC ACID PO Take 3,762 mcg by mouth daily.     . Hydrocortisone (PROCTOSOL HC RE) Place 1 application rectally 2 (two) times daily as needed (hemmorrhoids).    [name] levothyroxine (SYNTHROID, LEVOTHROID) 50 MCG tablet Take 50 mcg by mouth daily before breakfast.    . lisinopril (PRINIVIL,ZESTRIL) 20 MG tablet Take 20 mg by mouth daily.  11  . Lysine 500 MG TABS Take 500 mg by mouth daily.     . Melatonin 3 MG CAPS Take 3 mg by mouth at bedtime.     . Multiple Vitamin (MULTIVITAMIN WITH MINERALS) TABS tablet Take 1 tablet by mouth daily.    [name]  nystatin-triamcinolone ointment (MYCOLOG) Apply 1 application topically 2 (two) times daily as needed.    . pantoprazole (PROTONIX) 40 MG tablet Take 40 mg by mouth daily.    . Probiotic Product (ALIGN PO) Take 1 tablet by mouth daily.     . ranitidine (ZANTAC) 150 MG tablet Take 150 mg by mouth 2 (two) times daily.    . valACYclovir (VALTREX) 1000 MG tablet TAKE 1/2 TABLET DAILY FOR SUPPRESSION  5  . Vitamin D, Ergocalciferol, 2000 units CAPS Take 2,000 Units by mouth daily.      No current facility-administered medications for this visit.     OBJECTIVE: Middle-aged white woman who appears younger than stated age  28:   05/28/18 1555  BP: 126/77  Pulse: 86  Resp: 18  Temp: 98.6 F (37 C)  SpO2: 97%     Body mass index is 35.75 kg/m.   Wt Readings from Last 3 Encounters:  05/28/18 223 lb 3.2 oz (101.2 kg)  05/15/18 221 lb 3.2 oz (100.3 kg)  05/15/18 221 lb 3.2 oz (100.3 kg)       ECOG FS:0 - Asymptomatic  Ocular: Sclerae unicteric, pupils round and equal Ear-nose-throat: Oropharynx clear and moist Lymphatic: No cervical or supraclavicular adenopathy Lungs no rales or rhonchi Heart regular rate and rhythm Abd soft, nontender, positive bowel sounds MSK no focal spinal tenderness, no joint edema Neuro: non-focal, well-oriented, appropriate affect Breasts: Right breast is status post lumpectomy.  The cosmetic result is excellent.  There is no erythema, swelling, or dehiscence.  The left breast is benign.  Both axillae are benign.   LAB RESULTS:  CMP     Component Value Date/Time   NA 141 05/28/2018 1520   NA 140 05/02/2006 1027   K 4.0 05/28/2018 1520   K 4.0 05/02/2006 1027   CL 107 05/28/2018 1520   CL 104 05/02/2006 1027   CO2 26 05/28/2018 1520   CO2 22 05/02/2006 1027   GLUCOSE 108 (H) 05/28/2018 1520   GLUCOSE 99 05/02/2006 1027   BUN 15 05/28/2018 1520   BUN 12 05/02/2006 1027   CREATININE 1.11 (H) 05/28/2018 1520   CREATININE 0.9 05/02/2006 1027   CALCIUM 9.7 05/28/2018 1520   CALCIUM 9.6 05/02/2006 1027   PROT 7.7 05/28/2018 1520   ALBUMIN 4.5 05/28/2018 1520   AST 37 05/28/2018 1520   ALT 85 (H) 05/28/2018 1520   ALKPHOS 66 05/28/2018 1520   BILITOT 0.6 05/28/2018 1520   GFRNONAA 55 (L) 05/28/2018 1520   GFRAA >60 05/28/2018 1520    No results found for: TOTALPROTELP, ALBUMINELP, A1GS, A2GS, BETS, BETA2SER, GAMS, MSPIKE, SPEI  No results found for: KPAFRELGTCHN, LAMBDASER, KAPLAMBRATIO  Lab Results  Component Value Date   WBC 6.1 05/28/2018   NEUTROABS 3.3 05/28/2018   HGB 12.7 05/28/2018   HCT 37.5 05/28/2018   MCV 94.2 05/28/2018   PLT 228 05/28/2018    '@LASTCHEMISTRY'$ @  No results found for: LABCA2  No components found for: [id number]  No results for input(s): INR in the last 168 hours.  No results found for: LABCA2  No results found for: KDX833  No results found for: ASN053  No results found for:  ZJQ734  No results found for: CA2729  No components found for: HGQUANT  No results found for: CEA1 / No results found for: CEA1   No results found for: AFPTUMOR  No results found for: CHROMOGRNA  No results found for: PSA1  Appointment on 05/28/2018  Component Date Value Ref  Range Status  . WBC Count 05/28/2018 6.1  3.9 - 10.3 K/uL Final  . RBC 05/28/2018 3.98  3.70 - 5.45 MIL/uL Final  . Hemoglobin 05/28/2018 12.7  11.6 - 15.9 g/dL Final  . HCT 05/28/2018 37.5  34.8 - 46.6 % Final  . MCV 05/28/2018 94.2  79.5 - 101.0 fL Final  . MCH 05/28/2018 31.9  25.1 - 34.0 pg Final  . MCHC 05/28/2018 33.9  31.5 - 36.0 g/dL Final  . RDW 05/28/2018 12.3  11.2 - 14.5 % Final  . Platelet Count 05/28/2018 228  145 - 400 K/uL Final  . Neutrophils Relative % 05/28/2018 53  % Final  . Neutro Abs 05/28/2018 3.3  1.5 - 6.5 K/uL Final  . Lymphocytes Relative 05/28/2018 33  % Final  . Lymphs Abs 05/28/2018 2.0  0.9 - 3.3 K/uL Final  . Monocytes Relative 05/28/2018 8  % Final  . Monocytes Absolute 05/28/2018 0.5  0.1 - 0.9 K/uL Final  . Eosinophils Relative 05/28/2018 5  % Final  . Eosinophils Absolute 05/28/2018 0.3  0.0 - 0.5 K/uL Final  . Basophils Relative 05/28/2018 1  % Final  . Basophils Absolute 05/28/2018 0.0  0.0 - 0.1 K/uL Final   Performed at [facility], [place] [address], [place], [place] [address]  . Sodium 05/28/2018 141  135 - 145 mmol/L Final   Please note reference intervals were recently updated.  . Potassium 05/28/2018 4.0  3.5 - 5.1 mmol/L Final  . Chloride 05/28/2018 107  98 - 111 mmol/L Final  . CO2 05/28/2018 26  22 - 32 mmol/L Final  . Glucose, Bld 05/28/2018 108* 70 - 99 mg/dL Final  . BUN 05/28/2018 15  6 - 20 mg/dL Final   Please note change in reference range.  . Creatinine 05/28/2018 1.11* 0.44 - 1.00 mg/dL Final  . Calcium 05/28/2018 9.7  8.9 - 10.3 mg/dL Final  . Total Protein 05/28/2018 7.7  6.5 - 8.1 g/dL Final  . Albumin 05/28/2018 4.5   3.5 - 5.0 g/dL Final  . AST 05/28/2018 37  15 - 41 U/L Final  . ALT 05/28/2018 85* 0 - 44 U/L Final  . Alkaline Phosphatase 05/28/2018 66  38 - 126 U/L Final  . Total Bilirubin 05/28/2018 0.6  0.3 - 1.2 mg/dL Final  . GFR, Est Non Af Am 05/28/2018 55* >60 mL/min Final  . GFR, Est AFR Am 05/28/2018 >60  >60 mL/min Final   Comment: (NOTE) The eGFR has been calculated using the CKD EPI equation. This calculation has not been validated in all clinical situations. eGFR's persistently <60 mL/min signify possible Chronic Kidney Disease.   Georgiann Hahn gap 05/28/2018 8  5 - 15 Final   Performed at [facility], [place] [address] [place], [place] [address]    (this displays the last labs from the last 3 days)  No results found for: TOTALPROTELP, ALBUMINELP, A1GS, A2GS, BETS, BETA2SER, GAMS, MSPIKE, SPEI (this displays SPEP labs)  No results found for: KPAFRELGTCHN, LAMBDASER, KAPLAMBRATIO (kappa/lambda light chains)  No results found for: HGBA, HGBA2QUANT, HGBFQUANT, HGBSQUAN (Hemoglobinopathy evaluation)   No results found for: LDH  No results found for: IRON, TIBC, IRONPCTSAT (Iron and TIBC)  No results found for: FERRITIN  Urinalysis No results found for: COLORURINE, APPEARANCEUR, LABSPEC, PHURINE, GLUCOSEU, HGBUR, BILIRUBINUR, KETONESUR, PROTEINUR, UROBILINOGEN, NITRITE, LEUKOCYTESUR   STUDIES: Mm Breast Surgical Specimen  Result Date: 04/30/2018 CLINICAL DATA:  Evaluate surgical specimen following excision of RIGHT breast  complex sclerosing lesion. EXAM: SPECIMEN RADIOGRAPH OF THE RIGHT BREAST COMPARISON:  Previous exam(s). FINDINGS: Status post excision of the RIGHT breast. The radioactive seed and biopsy marker clip are present, completely intact, and were marked for pathology. IMPRESSION: Specimen radiograph of the RIGHT breast. Electronically Signed   By: Margarette Canada M.D.   On: 04/30/2018 10:00    ELIGIBLE FOR AVAILABLE RESEARCH PROTOCOL:  no  ASSESSMENT: 57 y.o. [place] woman status post right lumpectomy 04/30/2018 for extensive ductal carcinoma in situ, grade 2, estrogen and progesterone receptor positive  (1) adjuvant radiation pending, to be completed 07/06/2018  (2) to start adjuvant antiestrogens at the completion of local therapy  PLAN: We spent the better part of today's hour-long appointment discussing the biology of her diagnosis and the specifics of her situation. Ynez understands that in noninvasive ductal carcinoma, also called ductal carcinoma in situ ("DCIS") the breast cancer cells remain trapped in the ducts were they started. They cannot travel to a vital organ. For that reason these cancers in themselves are not life-threatening.  Since she opted (correctly in our opinion) to keep her breast, there will be some risk of recurrence. The recurrence can only be in the same breast since, again, the cells are trapped in the ducts. There is no connection from one breast to the other. The risk of local recurrence is cut by more than half with radiation, which is standard in this situation.  In estrogen receptor positive cancers like Hermina's, anti-estrogens  will further reduce the risk of recurrence by about one half. In addition anti-estrogens will lower the risk of a new breast cancer developing in either breast, also by one half. That risk otherwise approaches 1% per year.   Accordingly the overall plan is for surgery, followed by radiation, then a consideration of anti-estrogens.  Simona Huh is very interested in antiestrogens but there are 2 major issues, 1 of them is her factor V Leiden heterozygosity.  The other one is her concern regarding vaginal dryness.  Basically she will not be able to use vaginal estrogens unless she takes tamoxifen.  Unfortunately tamoxifen will increase the risk of clotting.  On the plus side she took oral contraceptives for many years with no clotting complications.  We had a detailed  discussion concerning the difference between aromatase inhibitors and tamoxifen and she has this information in writing.  She will be discussing this with her gynecologist and husband and also she will come to our "pelvic health" program and discuss the whole situation with Earlie Counts.  Angelyne will then return to see me early September, after she completes her radiation treatments to make a final decision regarding antiestrogen  She has a good understanding of the overall plan. She agrees with it. She knows the goal of treatment in her case is cure. She will call with any problems that may develop before her next visit here.  Yvonne Wyatt  05/28/18 4:13 PM Medical Oncology and Hematology [facility] [address] [place], [place] [address] Tel. [phone]    Fax. [phone]  I, Margit Banda am acting as a scribe for Chauncey Cruel, Wyatt.   I, Lurline Del Wyatt, have reviewed the above documentation for accuracy and completeness, and I agree with the above.

## 2018-05-28 ENCOUNTER — Inpatient Hospital Stay: Payer: 59

## 2018-05-28 ENCOUNTER — Inpatient Hospital Stay: Payer: 59 | Attending: Oncology | Admitting: Oncology

## 2018-05-28 ENCOUNTER — Ambulatory Visit: Payer: 59 | Admitting: Rehabilitation

## 2018-05-28 VITALS — BP 126/77 | HR 86 | Temp 98.6°F | Resp 18 | Ht 66.25 in | Wt 223.2 lb

## 2018-05-28 DIAGNOSIS — M8580 Other specified disorders of bone density and structure, unspecified site: Secondary | ICD-10-CM | POA: Diagnosis not present

## 2018-05-28 DIAGNOSIS — Z79899 Other long term (current) drug therapy: Secondary | ICD-10-CM | POA: Insufficient documentation

## 2018-05-28 DIAGNOSIS — I10 Essential (primary) hypertension: Secondary | ICD-10-CM | POA: Insufficient documentation

## 2018-05-28 DIAGNOSIS — Z170 Estrogen receptor positive status [ER+]: Secondary | ICD-10-CM | POA: Diagnosis not present

## 2018-05-28 DIAGNOSIS — D6851 Activated protein C resistance: Secondary | ICD-10-CM | POA: Diagnosis not present

## 2018-05-28 DIAGNOSIS — D0511 Intraductal carcinoma in situ of right breast: Secondary | ICD-10-CM

## 2018-05-28 DIAGNOSIS — Z7982 Long term (current) use of aspirin: Secondary | ICD-10-CM | POA: Diagnosis not present

## 2018-05-28 DIAGNOSIS — K219 Gastro-esophageal reflux disease without esophagitis: Secondary | ICD-10-CM | POA: Diagnosis not present

## 2018-05-28 DIAGNOSIS — Z780 Asymptomatic menopausal state: Secondary | ICD-10-CM | POA: Diagnosis not present

## 2018-05-28 DIAGNOSIS — E039 Hypothyroidism, unspecified: Secondary | ICD-10-CM | POA: Diagnosis not present

## 2018-05-28 LAB — CMP (CANCER CENTER ONLY)
ALT: 85 U/L — AB (ref 0–44)
ANION GAP: 8 (ref 5–15)
AST: 37 U/L (ref 15–41)
Albumin: 4.5 g/dL (ref 3.5–5.0)
Alkaline Phosphatase: 66 U/L (ref 38–126)
BUN: 15 mg/dL (ref 6–20)
CHLORIDE: 107 mmol/L (ref 98–111)
CO2: 26 mmol/L (ref 22–32)
CREATININE: 1.11 mg/dL — AB (ref 0.44–1.00)
Calcium: 9.7 mg/dL (ref 8.9–10.3)
GFR, EST NON AFRICAN AMERICAN: 55 mL/min — AB (ref >60)
Glucose, Bld: 108 mg/dL — ABNORMAL HIGH (ref 70–99)
Potassium: 4 mmol/L (ref 3.5–5.1)
Sodium: 141 mmol/L (ref 135–145)
Total Bilirubin: 0.6 mg/dL (ref 0.3–1.2)
Total Protein: 7.7 g/dL (ref 6.5–8.1)

## 2018-05-28 LAB — CBC WITH DIFFERENTIAL (CANCER CENTER ONLY)
Basophils Absolute: 0 10*3/uL (ref 0.0–0.1)
Basophils Relative: 1 %
EOS PCT: 5 %
Eosinophils Absolute: 0.3 10*3/uL (ref 0.0–0.5)
HCT: 37.5 % (ref 34.8–46.6)
Hemoglobin: 12.7 g/dL (ref 11.6–15.9)
LYMPHS ABS: 2 10*3/uL (ref 0.9–3.3)
LYMPHS PCT: 33 %
MCH: 31.9 pg (ref 25.1–34.0)
MCHC: 33.9 g/dL (ref 31.5–36.0)
MCV: 94.2 fL (ref 79.5–101.0)
MONO ABS: 0.5 10*3/uL (ref 0.1–0.9)
MONOS PCT: 8 %
Neutro Abs: 3.3 10*3/uL (ref 1.5–6.5)
Neutrophils Relative %: 53 %
Platelet Count: 228 10*3/uL (ref 145–400)
RBC: 3.98 MIL/uL (ref 3.70–5.45)
RDW: 12.3 % (ref 11.2–14.5)
WBC: 6.1 10*3/uL (ref 3.9–10.3)

## 2018-05-29 ENCOUNTER — Encounter: Payer: Self-pay | Admitting: Physical Therapy

## 2018-05-29 ENCOUNTER — Ambulatory Visit: Payer: 59 | Attending: Radiation Oncology | Admitting: Physical Therapy

## 2018-05-29 ENCOUNTER — Other Ambulatory Visit: Payer: Self-pay

## 2018-05-29 ENCOUNTER — Telehealth: Payer: Self-pay | Admitting: Oncology

## 2018-05-29 DIAGNOSIS — C50911 Malignant neoplasm of unspecified site of right female breast: Secondary | ICD-10-CM | POA: Diagnosis present

## 2018-05-29 DIAGNOSIS — R252 Cramp and spasm: Secondary | ICD-10-CM | POA: Diagnosis not present

## 2018-05-29 DIAGNOSIS — M6281 Muscle weakness (generalized): Secondary | ICD-10-CM | POA: Diagnosis not present

## 2018-05-29 DIAGNOSIS — Z170 Estrogen receptor positive status [ER+]: Secondary | ICD-10-CM | POA: Insufficient documentation

## 2018-05-29 DIAGNOSIS — R279 Unspecified lack of coordination: Secondary | ICD-10-CM | POA: Insufficient documentation

## 2018-05-29 NOTE — Therapy (Signed)
[facility] [address] [place], [place], [address] Phone: [phone]   Fax:  [phone]  Physical Therapy Evaluation  Patient Details  Name: LENISE JR MRN: [id number] Date of Birth: July 08, 1961 Referring Provider: Dr. Eppie Gibson   Encounter Date: 05/29/2018  PT End of Session - 05/29/18 0857    Visit Number  1    Date for PT Re-Evaluation  09/29/18    Authorization Type  [employer]    Authorization - Visit Number  1    Authorization - Number of Visits  23    PT Start Time  0800    PT Stop Time  2423    PT Time Calculation (min)  57 min    Activity Tolerance  Patient tolerated treatment well    Behavior During Therapy  [facility] for tasks assessed/performed       Past Medical History:  Diagnosis Date  . Complication of anesthesia   . Dyspareunia 05/20/2014   Early menopause age 36  . Fatigue   . GERD (gastroesophageal reflux disease)   . Headache   . Heterozygous factor V Leiden mutation ([place]) 05/20/2014   Dx 2007  No personal hx of thrombosis;  mother DVT age 59 on [place]  . History of hiatal hernia   . Hypertension   . Hypothyroidism   . Osteopenia   . PONV (postoperative nausea and vomiting)   . Sleep apnea     Past Surgical History:  Procedure Laterality Date  . CESAREAN SECTION    . DIAGNOSTIC LAPAROSCOPY     for endometriosis  . GANGLION CYST EXCISION Right    right wrist  . INGUINAL HERNIA REPAIR     x 2 , at 18 months and 57 years old.   [name] RADIOACTIVE SEED GUIDED EXCISIONAL BREAST BIOPSY Right 04/30/2018   Procedure: RIGHT RADIOACTIVE SEED GUIDED EXCISIONAL BREAST BIOPSY ERAS PATHWAY;  Surgeon: Rolm Bookbinder, MD;  Location: [facility];  Service: General;  Laterality: Right;  . SINUS EXPLORATION  1990    There were no vitals filed for this visit.   Subjective Assessment - 05/29/18 0806    Subjective  Will start radiation 06/11/2018 for right breast cancer.  I will start Tomaxifen and Aromatase  inhibitor. I have not had intercourse for 6 years. Pain with vanginal exam. Tear with intercourse at posterior introitus.     Patient Stated Goals  ability to have penile penetration and vaginal exam    Currently in Pain?  Yes    Pain Score  5     Pain Location  Vagina    Pain Orientation  Mid    Pain Descriptors / Indicators  Pressure;Burning searing, itchy    Pain Type  Chronic pain    Pain Onset  More than a month ago    Pain Frequency  Intermittent    Aggravating Factors   penile penetration, wiping herself, sitting long period of time , wearing a bathing suit    Pain Relieving Factors  stop the activity, generic microstate    Multiple Pain Sites  No         [facility] PT Assessment - 05/29/18 0001      Assessment   Medical Diagnosis  D05.11 Ductal carcinoma in situ (DCIS) of right breast    Referring Provider  Dr. Eppie Gibson    Onset Date/Surgical Date  06/18/18    Prior Therapy  none      Precautions   Precautions  Other (comment)  Precaution Comments  breast cancer going through radiation      Restrictions   Weight Bearing Restrictions  No      Balance Screen   Has the patient fallen in the past 6 months  No    Has the patient had a decrease in activity level because of a fear of falling?   No    Is the patient reluctant to leave their home because of a fear of falling?   No      Home Film/video editor residence      Prior Function   Level of Independence  Independent    Vocation  Full time employment    Vocation Requirements  lifting, walking,     Leisure  walking      Cognition   Overall Cognitive Status  Within Functional Limits for tasks assessed      Observation/Other Assessments   Skin Integrity  c-section scar has decreased mobility    Focus on Therapeutic Outcomes (FOTO)   therapist discrection limited by 505 due to pain      Posture/Postural Control   Posture/Postural Control  No significant limitations      ROM / Strength    AROM / PROM / Strength  AROM;PROM;Strength      AROM   Overall AROM Comments  limited by 25% for lumbar      PROM   Overall PROM   Within functional limits for tasks performed      Strength   Overall Strength Comments  abdominal strength 2/5    Right Hip External Rotation   3+/5    Left Hip External Rotation  3+/5      Palpation   Palpation comment  tightness in c-section scar and diaphgram, Tenderness located in left lumbar sacral area      Transfers   Transfers  Independent with all Transfers      Ambulation/Gait   Ambulation/Gait  No                Objective measurements completed on examination: See above findings.    Pelvic Floor Special Questions - 05/29/18 0001    Prior Pregnancies  Yes    Number of Pregnancies  1    Number of C-Sections  1    Currently Sexually Active  No    Marinoff Scale  pain prevents any attempts at intercourse    Urinary Leakage  No    Urinary urgency  Yes has to rush to the restroom    Urinary frequency  no    Skin Integrity  Intact dryness    Perineal Body/Introitus   Other    Perineal Body/Introitus other  tender and tight    Prolapse  None    Pelvic Floor Internal Exam  Patient confirms identification and approves PT to treat pelvic floor    Exam Type  Vaginal    Palpation  tenderness located in intoritus especially posterior, bil. obturator internist, bil. ischiocavernosus, pubovaginalis; tightness of bil. urethra sphincter    Strength  weak squeeze, no lift    Strength # of reps  5    Strength # of seconds  4               PT Education - 05/29/18 0904    Education Details  information on vaginal moisturizers, infromation on stretching pelvic floor without dilator    Person(s) Educated  Patient    Methods  Explanation;Demonstration;Handout    Comprehension  Verbalized understanding;Returned demonstration       PT Short Term Goals - 05/29/18 0920      PT SHORT TERM GOAL #1   Title  independent with  initial HEP    Time  4    Period  Weeks    Status  New    Target Date  06/26/18      PT SHORT TERM GOAL #2   Title  understand how to perform perneal massage to soften tissue prior to vaginal exam in the next week    Time  4    Period  Weeks    Status  New    Target Date  06/26/18      PT SHORT TERM GOAL #3   Title  itching and burning reduce >/= 25% due to using vaginal moisturizers in the vaginal canal and vulva    Time  4    Period  Weeks    Status  New    Target Date  06/26/18        PT Long Term Goals - 05/29/18 0921      PT LONG TERM GOAL #1   Title  independent with HEP and understand how to progress hersel    Time  4    Period  Months    Status  New    Target Date  09/29/18      PT LONG TERM GOAL #2   Title  abiltiy to have penile penetration with marinofff score 1/3 due to improve tissue health and mobility    Time  4    Period  Months    Status  New    Target Date  09/29/18      PT LONG TERM GOAL #3   Title  ability to sit with discomfort decreased >/= 75% due to improved vulva health    Time  4    Period  Months    Status  New    Target Date  06/26/18      PT LONG TERM GOAL #4   Title  ability to wipe herself with pain decreased >/= 75%    Time  4    Period  Months    Status  New    Target Date  09/29/18      PT LONG TERM GOAL #5   Title  abilty to sit for 1-2 hours with pain decreased >/= 75% due to improved tissue mobility    Time  4    Period  Months    Status  New    Target Date  09/29/18             Plan - 05/29/18 0904    Clinical Impression Statement  Patient is a 57 year old female with s/p right beast cancer estrogen positive and will start radiation on 06/11/2018. Patient was using Estroidial on the vulva area but now has to stop due to the breast cancer.  Patient reports 5/10 pain in the baginal area with vaginal exam, sitting with leg crossed, wiping the genital area, and wearing a bathing suit.  Patient has not had penile  pentration in 6 years due to pain and Marinoff score is 3/3.  Bilateral hip external rotation is 3+/5.  Lumbar ROM limited by 25%. Decreased mobility of c-section scar.  Pelvic floor strengtn is 2/5 with no lift.  Decreased mobility of bilateral urethra sphincter and perineal body.  Patient is able to fully relax her pelvic floor after a contraction.  Tenderness located in the introitus, bilateral obturator internist, bilateral levator ani with more tightness on left pelvic floor compared to the right.  Patient reports  she has to quickly go to the bathroom when she has the urge to void.  Patient reports difficulty with constipation.  Patient will benefi tfrom skilled therapy to reduce pelvic floor trigger points, reduce pelvic pain and improve coordination.     History and Personal Factors relevant to plan of care:  s/p right breast cancer estrogen positive with radiation starting on 06/11/2018; endometriosis, right and left hernia repair; c-section    Clinical Presentation  Evolving    Clinical Presentation due to:  pain makes it difficulty for vaginal exam, penile penetration, sitting, wearing a bathing suit    Clinical Decision Making  Moderate    Rehab Potential  Excellent    Clinical Impairments Affecting Rehab Potential  s/p right breast cancer estrogen positive with radiation starting on 06/11/2018; endometriosis, right and left hernia repair; c-section    PT Frequency  1x / week    PT Duration  Other (comment) 4 months    PT Treatment/Interventions  Biofeedback;Electrical Stimulation;Therapeutic activities;Therapeutic exercise;Patient/family education;Neuromuscular re-education;Manual techniques;Scar mobilization;Passive range of motion;Dry needling    PT Next Visit Plan  toileting technique, hip stretches, scar massage, internal soft tissue work    Oncologist with Plan of Care  Patient       Patient will benefit from skilled therapeutic intervention in order to improve the following  deficits and impairments:  Increased fascial restricitons, Pain, Decreased coordination, Decreased mobility, Decreased scar mobility, Increased muscle spasms, Impaired tone, Decreased activity tolerance, Decreased endurance, Decreased range of motion, Decreased strength, Impaired flexibility  Visit Diagnosis: Cramp and spasm - Plan: PT plan of care cert/re-cert  Muscle weakness (generalized) - Plan: PT plan of care cert/re-cert  Unspecified lack of coordination - Plan: PT plan of care cert/re-cert  Malignant neoplasm of right breast in female, estrogen receptor positive, unspecified site of breast ([place]) - Plan: PT plan of care cert/re-cert     Problem List Patient Active Problem List   Diagnosis Date Noted  . Ductal carcinoma in situ (DCIS) of right breast 05/15/2018  . Obstructive sleep apnea treated with continuous positive airway pressure (CPAP) 03/06/2018  . Heterozygous factor V Leiden mutation ([place]) 05/20/2014  . Dyspareunia 05/20/2014    Earlie Counts, PT  [facility] [address] [place], [place], [address] Phone: [phone]   Fax:  [phone]  Name: JAMICA WOODYARD MRN: [id number] Date of Birth: 1961-01-08

## 2018-05-29 NOTE — Telephone Encounter (Signed)
Per 7/8 no los

## 2018-05-29 NOTE — Patient Instructions (Signed)
STRETCHING THE PELVIC FLOOR MUSCLES NO DILATOR  Supplies . Vaginal lubricant . Mirror (optional) . Gloves (optional) Positioning . Start in a semi-reclined position with your head propped up. Bend your knees and place your thumb or finger at the vaginal opening. Procedure . Apply a moderate amount of lubricant on the outer skin of your vagina, the labia minora.  Apply additional lubricant to your finger. [name] Spread the skin away from the vaginal opening. Place the end of your finger at the opening. . Do a maximum contraction of the pelvic floor muscles. Tighten the vagina and the anus maximally and relax. . When you know they are relaxed, gently and slowly insert your finger into your vagina, directing your finger slightly downward, for 2-3 inches of insertion. . Relax and stretch the 6 o'clock position . Hold each stretch for _2 min__ and repeat __1_ time with rest breaks of _1__ seconds between each stretch. . Repeat the stretching in the 4 o'clock and 8 o'clock positions. . Total time should be _6__ minutes, _1__ x per day.  Note the amount of theme your were able to achieve and your tolerance to your finger in your vagina. . Once you have accomplished the techniques you may try them in standing with one foot resting on the tub, or in other positions.  This is a good stretch to do in the shower if you don't need to use lubricant.  Moisturizers . They are used in the vagina to hydrate the mucous membrane that make up the vaginal canal. . Designed to keep a more normal acid balance (ph) . Once placed in the vagina, it will last between two to three days.  . Use 2-3 times per week at bedtime and last longer than 60 min. . Ingredients to avoid is glycerin and fragrance, can increase chance of infection . Should not be used just before sex due to causing irritation . Most are gels administered either in a tampon-shaped applicator or as a vaginal suppository. They are non-hormonal.   Types of  Moisturizers . Samul Dada- drug store . Vitamin E vaginal suppositories- Whole foods, Amazon . Moist Again . Coconut oil- can break down condoms . Michail Jewels . Yes moisturizer- amazon . NeuEve Silk , NeuEve Silver for menopausal or over 65 (if have severe vaginal atrophy or cancer treatments use NeuEve Silk for  1 month than move to [employer])- [employer], MapleFlower.dk . Olive and Bee intimate cream- www.oliveandbee.com.au  Creams to use externally on the Vulva area  Albertson's (good for for cancer patients that had radiation to the area)- [place] or [employer].FlyingBasics.com.br  V-magic cream - amazon  Julva-amazon  Vital "V [place] salve ( help moisturize and help with thinning vulvar area, does have [place]   Things to avoid in the vaginal area . Do not use things to irritate the vulvar area . No lotions just specialized creams for the vulva area- Neogyn, V-magic, No soaps; can use Aveeno or Calendula cleanser if needed. Must be gentle . No deodorants . No douches . Good to sleep without underwear to let the vaginal area to air out . No scrubbing: spread the lips to let warm water rinse over labias and pat dry [facility] [address] [place], [place] [address] Phone # [phone] Fax [phone]

## 2018-05-30 ENCOUNTER — Other Ambulatory Visit: Payer: Self-pay | Admitting: General Surgery

## 2018-05-30 DIAGNOSIS — D0501 Lobular carcinoma in situ of right breast: Secondary | ICD-10-CM

## 2018-05-30 DIAGNOSIS — N6091 Unspecified benign mammary dysplasia of right breast: Secondary | ICD-10-CM

## 2018-06-01 DIAGNOSIS — Z01419 Encounter for gynecological examination (general) (routine) without abnormal findings: Secondary | ICD-10-CM | POA: Diagnosis not present

## 2018-06-04 ENCOUNTER — Encounter: Payer: Self-pay | Admitting: Radiation Oncology

## 2018-06-04 ENCOUNTER — Ambulatory Visit
Admission: RE | Admit: 2018-06-04 | Discharge: 2018-06-04 | Disposition: A | Discharge status: Home / Self Care | Payer: 59 | Source: Ambulatory Visit | Attending: Radiation Oncology | Admitting: Radiation Oncology

## 2018-06-04 DIAGNOSIS — Z510 Encounter for antineoplastic radiation therapy: Secondary | ICD-10-CM | POA: Insufficient documentation

## 2018-06-04 DIAGNOSIS — D0511 Intraductal carcinoma in situ of right breast: Secondary | ICD-10-CM | POA: Insufficient documentation

## 2018-06-04 NOTE — Progress Notes (Signed)
  Radiation Oncology         [phone] ________________________________  Name: Yvonne Wyatt MRN: [id number]  Date: 06/04/2018  DOB: [birth year]  SIMULATION AND TREATMENT PLANNING NOTE    Outpatient  DIAGNOSIS:     ICD-10-CM   1. Ductal carcinoma in situ (DCIS) of right breast D05.11     NARRATIVE:  The patient was brought to the [facility].  Identity was confirmed.  All relevant records and images related to the planned course of therapy were reviewed.  The patient freely provided informed written consent to proceed with treatment after reviewing the details related to the planned course of therapy. The consent form was witnessed and verified by the simulation staff.    Then, the patient was set-up in a stable reproducible supine position for radiation therapy with her ipsilateral arm over her head, and her upper body secured in a custom-made Vac-lok device.  CT images were obtained.  Surface markings were placed.  The CT images were loaded into the planning software.    TREATMENT PLANNING NOTE: Treatment planning then occurred.  The radiation prescription was entered and confirmed.     A total of 3 medically necessary complex treatment devices were fabricated and supervised by me: 2 fields with MLCs for custom blocks to protect heart, and lungs;  and, a Vac-lok. MORE COMPLEX DEVICES MAY BE MADE IN DOSIMETRY FOR FIELD IN FIELD BEAMS FOR DOSE HOMOGENEITY.  I have requested : 3D Simulation which is medically necessary to give adequate dose to at risk tissues while sparing lungs and heart.  I have requested a DVH of the following structures: lungs, heart, right lumpectomy cavity.    The patient will receive 40.05 Gy in 15 fractions to the right breast with 2 tangential fields.   This will be followed by a boost.  Optical Surface Tracking Plan:  Since intensity modulated radiotherapy (IMRT) and 3D conformal radiation treatment methods are predicated on accurate and precise  positioning for treatment, intrafraction motion monitoring is medically necessary to ensure accurate and safe treatment delivery. The ability to quantify intrafraction motion without excessive ionizing radiation dose can only be performed with optical surface tracking. Accordingly, surface imaging offers the opportunity to obtain 3D measurements of patient position throughout IMRT and 3D treatments without excessive radiation exposure. I am ordering optical surface tracking for this patient's upcoming course of radiotherapy.  ________________________________   Reference:  Ursula Alert, J, et al. Surface imaging-based analysis of intrafraction motion for breast radiotherapy patients.Journal of [place], n. 6, nov. 2014. ISSN [id number].  Available at: <http://www.jacmp.org/index.php/jacmp/article/view/4957>.    -----------------------------------  Eppie Gibson, MD

## 2018-06-08 ENCOUNTER — Ambulatory Visit: Payer: 59 | Admitting: Physical Therapy

## 2018-06-08 ENCOUNTER — Encounter: Payer: Self-pay | Admitting: Physical Therapy

## 2018-06-08 DIAGNOSIS — Z170 Estrogen receptor positive status [ER+]: Secondary | ICD-10-CM

## 2018-06-08 DIAGNOSIS — M6281 Muscle weakness (generalized): Secondary | ICD-10-CM

## 2018-06-08 DIAGNOSIS — R279 Unspecified lack of coordination: Secondary | ICD-10-CM

## 2018-06-08 DIAGNOSIS — R252 Cramp and spasm: Secondary | ICD-10-CM

## 2018-06-08 DIAGNOSIS — C50911 Malignant neoplasm of unspecified site of right female breast: Secondary | ICD-10-CM

## 2018-06-08 DIAGNOSIS — D0511 Intraductal carcinoma in situ of right breast: Secondary | ICD-10-CM | POA: Diagnosis not present

## 2018-06-08 NOTE — Patient Instructions (Addendum)
Irritants/Allergens ( on exposure, can cause immediate stinging or burning)  Body fluids: urine, feces, vaginal discharge, sweat, semen  Feminine Hygiene Products: douches, yogurt douche, feminine wipes, baby wipes, sanitary pads, panty liners, tampons, deodorants (including deodorants in tampons/pads), lotions, powders (including talcum powder), perfumes, shampoos,soaps  Sexual support: lubricants, condoms, diaphragms, spermicides, arousal stimulants  Laundry detergent, bleach, fabric softener  Cleansing products: soap, bubble baths and salts, shampoo, conditioner, perfumed toielt paper  Physical irritants: tight fitting clothes, Nylon underwear, synthetic undergarments/pantyhose, chemically treated clothing, latex, wash cloths, sponges, hot water, excessive washing, vigorous drying with towel/hair dryer  HPV medication, tea tree oil, [place]  Alcohol and astringents  Topical medicaments: Benzocaine, Neomycin, Chlorhexidine (in K-Y Jelly), Imidazole antifungal, Propylene glycol ( Preservative used in many products:, tea tree oil, preservatives and bases medications are placed in.   Adapted from the V Book by Noland Fordyce. Nicole Kindred, MD., and Lynnell Dike [facility] Books, 2002), and Proceedings in Obstetrics and Gynecology, 2014; 4(2):1    Gentle Vulvar care- Adapted from the National Vulvodynia Association and the V Book  Wear loose clothing.  Wear all-cotton underwear, and go without when at home. Avoid wearing pantyhose, wear thigh high or knee high pantyhose. If you do not use underwear with yoga pants, try using an all-cotton against the vulva. Tight clothing can be irritating to the vulva by increasing friction and exacerbate irritation and redness.  Wear loose fitting clothes when possible. When swimming, remove wet bathing suits and shower to remove cholerine from the area.[name]  Avoid hot tubs and heavily chlorinated pools. Shower after exercise to eliminate excess consider avoiding  exercise that causes friction or pressure to the area such as horseback riding, bicycle riding, and spinning classes.   To cleanse the area, use your fingers instead of a washcloth and plain water.  If you feel you must use a cleanser, unscented, non-alkaline cleanser such as Cetaphil, or mild soaps made for sensitive skin such as Dove or Neurtrogena. Be sure any cleaners are unscented. Avoid rubbing the vulva. Soak for five minutes in lukewarm water to remove any residue of sweat or lotions or other products.  Pat dry, and apply any prescribed medication. Avoid products with multiple ingredients.  Even those that sound designed for vulvar care, like A& D Original Ointment, baby lotion, or Vagisil, contain chemicals that could irritate or cause contact dermatitis. Avoid using bubble bath, feminine hygiene sprays, or any type of perfumed creams or sprays near the vulva.  In the bathroom, forgo moistened wipes. If you want moisture, use a spray bottle with plain water, and then pat dry. Use soft, white, unscented toilet paper. Avoid repetitive back and forth motion or rubbing. Drink plenty of water and fluids to dilute urine. Concentrated urine can be irritating to the vulvar skin.   When doing laundry, use hypoallergenic laundry detergent that is very mild, such as those made for babies. Avoid using fabric softeners with undergarments and double rinse undergarments.  For sexual intercourse, be sure to use a lubricant that is without additives or preservatives.  Additives like bactericides, spermicides, warming agents, and flavors can cause vulvar irritation. Rinse the vulva after intercourse. A frozen gel pack wrapped in a towel can be used for irritation after exercise or intercourse.   Toileting Techniques for Bowel Movements (Defecation) Using your belly (abdomen) and pelvic floor muscles to have a bowel movement is usually instinctive.  Sometimes people can have problems with these muscles and have to  relearn proper defecation (emptying) techniques.  If you have weakness in your muscles, organs that are falling out, decreased sensation in your pelvis, or ignore your urge to go, you may find yourself straining to have a bowel movement.  You are straining if you are: . holding your breath or taking in a huge gulp of air and holding it  . keeping your lips and jaw tensed and closed tightly . turning red in the face because of excessive pushing or forcing . developing or worsening your  hemorrhoids . getting faint while pushing . not emptying completely and have to defecate many times a day  If you are straining, you are actually making it harder for yourself to have a bowel movement.  Many people find they are pulling up with the pelvic floor muscles and closing off instead of opening the anus. Due to lack pelvic floor relaxation and coordination the abdominal muscles, one has to work harder to push the feces out.  Many people have never been taught how to defecate efficiently and effectively.  Notice what happens to your body when you are having a bowel movement.  While you are sitting on the toilet pay attention to the following areas: . Jaw and mouth position . Angle of your hips   . Whether your feet touch the ground or not . Arm placement  . Spine position . Waist . Belly tension . Anus (opening of the anal canal)  An Evacuation/Defecation Plan   Here are the 4 basic points:  1. Lean forward enough for your elbows to rest on your knees 2. Support your feet on the floor or use a low stool if your feet don't touch the floor  3. Push out your belly as if you have swallowed a beach ball-you should feel a widening of your waist 4. Open and relax your pelvic floor muscles, rather than tightening around the anus      The following conditions my require modifications to your toileting posture:  . If you have had surgery in the past that limits your back, hip, pelvic, knee or ankle  flexibility . Constipation   Your healthcare practitioner may make the following additional suggestions and adjustments:  1) Sit on the toilet  a) Make sure your feet are supported. b) Notice your hip angle and spine position-most people find it effective to lean forward or raise their knees, which can help the muscles around the anus to relax  c) When you lean forward, place your forearms on your thighs for support  2) Relax suggestions a) Breath deeply in through your nose and out slowly through your mouth as if you are smelling the flowers and blowing out the candles. b) To become aware of how to relax your muscles, contracting and releasing muscles can be helpful.  Pull your pelvic floor muscles in tightly by using the image of holding back gas, or closing around the anus (visualize making a circle smaller) and lifting the anus up and in.  Then release the muscles and your anus should drop down and feel open. Repeat 5 times ending with the feeling of relaxation. c) Keep your pelvic floor muscles relaxed; let your belly bulge out. d) The digestive tract starts at the mouth and ends at the anal opening, so be sure to relax both ends of the tube.  Place your tongue on the roof of your mouth with your teeth separated.  This helps relax your mouth and will help to relax the anus at the same time.  3) Empty (defecation) a) Keep your pelvic floor and sphincter relaxed, then bulge your anal muscles.  Make the anal opening wide.  b) Stick your belly out as if you have swallowed a beach ball. c) Make your belly wall hard using your belly muscles while continuing to breathe. Doing this makes it easier to open your anus. d) Breath out and give a grunt (or try using other sounds such as ahhhh, shhhhh, ohhhh or grrrrrrr).  4) Finish a) As you finish your bowel movement, pull the pelvic floor muscles up and in.  This will leave your anus in the proper place rather than remaining pushed out and down. If  you leave your anus pushed out and down, it will start to feel as though that is normal and give you incorrect signals about needing to have a bowel movement.    Earlie Counts, PT [facility] [address] [place], [place] [address] Access Code: [email or web address]  URL: [email or web address]  Date: 06/08/2018  Prepared by: Earlie Counts   Exercises  Seated Hamstring Stretch - 2 reps - 1 sets - 30 hold - 1x daily - 7x weekly  Seated Piriformis Stretch with Trunk Bend - 2 reps - 1 sets - 30 sec hold - 1x daily - 7x weekly  Standing Hip Flexor Stretch - 2 reps - 1 sets - 30 sec hold - 1x daily - 7x weekly  Side Lunge Adductor Stretch - 2 reps - 1 sets - 30 sec hold - 1x daily - 7x weekly  Seated Hip Adductor Stretch - 2 reps - 1 sets - 30 sec hold - 1x daily - 7x weekly  Supine Butterfly Groin Stretch - 2 reps - 1 sets - 1 min hold - 1x daily - 7x weekly  Supine Pelvic Floor Stretch - 2 reps - 1 sets - 1 min hold - 1x daily - 7x weekly  Supine Lower Trunk Rotation - 2 reps - 1 sets - 30 sec hold - 1x daily - 7x weekly  Cat Cow - 10 reps - 1 sets - 1x daily - 7x weekly  Child's Pose Stretch - 2 reps - 1 sets - 30 sec hold - 1x daily - 7x weekly  Childs Pose Side Bend - 2 reps - 1 sets - 30 sec hold - 1x daily - 7x weekly  Seated Child's Pose with Table - 1 reps - 1 sets - 30 sec hold - 1x daily - 7x weekly   Trigger Point Dry Needling  . What is Trigger Point Dry Needling (DN)? o DN is a physical therapy technique used to treat muscle pain and dysfunction. Specifically, DN helps deactivate muscle trigger points (muscle knots).  o A thin filiform needle is used to penetrate the skin and stimulate the underlying trigger point. The goal is for a local twitch response (LTR) to occur and for the trigger point to relax. No medication of any kind is injected during the procedure.   . What Does Trigger Point Dry Needling Feel Like?  o The procedure feels different  for each individual patient. Some patients report that they do not actually feel the needle enter the skin and overall the process is not painful. Very mild bleeding may occur. However, many patients feel a deep cramping in the muscle in which the needle was inserted. This is the local twitch response.   [name] How Will I feel after the treatment? o Soreness is normal, and the onset of soreness may  not occur for a few hours. Typically this soreness does not last longer than two days.  o Bruising is uncommon, however; ice can be used to decrease any possible bruising.  o In rare cases feeling tired or nauseous after the treatment is normal. In addition, your symptoms may get worse before they get better, this period will typically not last longer than 24 hours.   . What Can I do After My Treatment? o Increase your hydration by drinking more water for the next 24 hours. o You may place ice or heat on the areas treated that have become sore, however, do not use heat on inflamed or bruised areas. Heat often brings more relief post needling. o You can continue your regular activities, but vigorous activity is not recommended initially after the treatment for 24 hours. o DN is best combined with other physical therapy such as strengthening, stretching, and other therapies.    [facility] [address], [place],  [address] Phone # [phone] Fax [phone]

## 2018-06-08 NOTE — Therapy (Signed)
[facility] [address], [place], [place], [address] Phone: [phone]   Fax:  [phone]  Physical Therapy Treatment  Patient Details  Name: Yvonne Wyatt MRN: [id number] Date of Birth: Mar 08, 1961 Referring Provider: Dr. Eppie Gibson   Encounter Date: 06/08/2018  PT End of Session - 06/08/18 1131    Visit Number  2    Date for PT Re-Evaluation  09/29/18    Authorization Type  [employer]    Authorization - Visit Number  2    Authorization - Number of Visits  23    PT Start Time  0800    PT Stop Time  0845    PT Time Calculation (min)  45 min    Activity Tolerance  Patient tolerated treatment well;No increased pain    Behavior During Therapy  WFL for tasks assessed/performed       Past Medical History:  Diagnosis Date  . Complication of anesthesia   . Dyspareunia 05/20/2014   Early menopause age 57  . Fatigue   . GERD (gastroesophageal reflux disease)   . Headache   . Heterozygous factor V Leiden mutation ([place]) 05/20/2014   Dx 2007  No personal hx of thrombosis;  mother DVT age 62 on OC  . History of hiatal hernia   . Hypertension   . Hypothyroidism   . Osteopenia   . PONV (postoperative nausea and vomiting)   . Sleep apnea     Past Surgical History:  Procedure Laterality Date  . CESAREAN SECTION    . DIAGNOSTIC LAPAROSCOPY     for endometriosis  . GANGLION CYST EXCISION Right    right wrist  . INGUINAL HERNIA REPAIR     x 2 , at 55 months and 55 years old.   [name] RADIOACTIVE SEED GUIDED EXCISIONAL BREAST BIOPSY Right 04/30/2018   Procedure: RIGHT RADIOACTIVE SEED GUIDED EXCISIONAL BREAST BIOPSY ERAS PATHWAY;  Surgeon: Rolm Bookbinder, MD;  Location: [facility];  Service: General;  Laterality: Right;  . SINUS EXPLORATION  1990    There were no vitals filed for this visit.  Subjective Assessment - 06/08/18 0802    Subjective  I feel okay.  I went with my annual and I tore at the introitus. I have burning  all the time. MD thinks I have vestibulitis.     Patient Stated Goals  ability to have penile penetration and vaginal exam    Currently in Pain?  Yes    Pain Score  4     Pain Location  Vagina    Pain Orientation  Mid    Pain Descriptors / Indicators  Pressure;Burning    Pain Type  Chronic pain    Pain Onset  More than a month ago    Pain Frequency  Intermittent    Aggravating Factors   penile penetration, wiping herself, sitting long period of time, wearing a bathing suit    Pain Relieving Factors  stop the activity, generic micrastate    Multiple Pain Sites  No                       [facility] Adult PT Treatment/Exercise - 06/08/18 0001      Self-Care   Self-Care  Other Self-Care Comments    Other Self-Care Comments   vulvar care      Therapeutic Activites    Therapeutic Activities  Other Therapeutic Activities    Other Therapeutic Activities  toileting technique  Manual Therapy   Manual Therapy  Soft tissue mobilization    Soft tissue mobilization  bilateral hip adductors, quadriceps and hamstrings       Trigger Point Dry Needling - 06/08/18 1129    Consent Given?  Yes    Education Handout Provided  Yes    Muscles Treated Lower Body  Quadriceps;Adductor longus/brevius/maximus;Hamstring    Quadriceps Response  Twitch response elicited;Palpable increased muscle length    Adductor Response  Twitch response elicited;Palpable increased muscle length    Hamstring Response  Twitch response elicited;Palpable increased muscle length           PT Education - 06/08/18 0828    Education Details  Access Code: [email or web address]     Person(s) Educated  Patient    Methods  Explanation;Demonstration;Verbal cues;Handout    Comprehension  Returned demonstration;Verbalized understanding       PT Short Term Goals - 06/08/18 1134      PT SHORT TERM GOAL #1   Title  independent with initial HEP    Time  4    Period  Weeks    Status  On-going      PT SHORT TERM GOAL #2    Title  understand how to perform perneal massage to soften tissue prior to vaginal exam in the next week    Time  4    Period  Weeks    Status  On-going      PT SHORT TERM GOAL #3   Title  itching and burning reduce >/= 25% due to using vaginal moisturizers in the vaginal canal and vulva    Time  4    Period  Weeks    Status  On-going        PT Long Term Goals - 05/29/18 2355      PT LONG TERM GOAL #1   Title  independent with HEP and understand how to progress hersel    Time  4    Period  Months    Status  New    Target Date  09/29/18      PT LONG TERM GOAL #2   Title  abiltiy to have penile penetration with marinofff score 1/3 due to improve tissue health and mobility    Time  4    Period  Months    Status  New    Target Date  09/29/18      PT LONG TERM GOAL #3   Title  ability to sit with discomfort decreased >/= 75% due to improved vulva health    Time  4    Period  Months    Status  New    Target Date  06/26/18      PT LONG TERM GOAL #4   Title  ability to wipe herself with pain decreased >/= 75%    Time  4    Period  Months    Status  New    Target Date  09/29/18      PT LONG TERM GOAL #5   Title  abilty to sit for 1-2 hours with pain decreased >/= 75% due to improved tissue mobility    Time  4    Period  Months    Status  New    Target Date  09/29/18            Plan - 06/08/18 7322    Clinical Impression Statement  Patient burning after therapy reduce 50% due to improve mobility of the hip adductor  tissues.  Patient had a tear at the posterior furchette so vaginal soft tissue work was not performed.  Patient understands correct toileting technique to reduce strain on the pelvic floor.  Patient had increased tissue mobility of the thigh muscles after the manual work. Patient has not met goals due to just starting therapy.  Patient will benefit from skilled therapy to reduce pelvic floor trigger points, reduce pelvic pain and improve coordination.      Rehab Potential  Excellent    Clinical Impairments Affecting Rehab Potential  s/p right breast cancer estrogen positive with radiation starting on 06/11/2018; endometriosis, right and left hernia repair; c-section    PT Frequency  1x / week    PT Duration  Other (comment) 4 months    PT Treatment/Interventions  Biofeedback;Electrical Stimulation;Therapeutic activities;Therapeutic exercise;Patient/family education;Neuromuscular re-education;Manual techniques;Scar mobilization;Passive range of motion;Dry needling    PT Next Visit Plan  scar massage, internal soft tissue work    PT Home Exercise Plan  Access Code: [email or web address]     Recommended Other Services  MD signed the initial eval    Consulted and Agree with Plan of Care  Patient       Patient will benefit from skilled therapeutic intervention in order to improve the following deficits and impairments:  Increased fascial restricitons, Pain, Decreased coordination, Decreased mobility, Decreased scar mobility, Increased muscle spasms, Impaired tone, Decreased activity tolerance, Decreased endurance, Decreased range of motion, Decreased strength, Impaired flexibility  Visit Diagnosis: Cramp and spasm  Muscle weakness (generalized)  Unspecified lack of coordination  Malignant neoplasm of right breast in female, estrogen receptor positive, unspecified site of breast [facility])     Problem List Patient Active Problem List   Diagnosis Date Noted  . Ductal carcinoma in situ (DCIS) of right breast 05/15/2018  . Obstructive sleep apnea treated with continuous positive airway pressure (CPAP) 03/06/2018  . Heterozygous factor V Leiden mutation ([place]) 05/20/2014  . Dyspareunia 05/20/2014    Earlie Counts, PT 06/08/18 11:36 AM    [facility] [address] [place], [place], [address] Phone: [phone]   Fax:  [phone]  Name: AIDYN SPORTSMAN MRN: [id number] Date of Birth:  1961-09-30

## 2018-06-11 ENCOUNTER — Other Ambulatory Visit: Payer: Self-pay | Admitting: Oncology

## 2018-06-11 ENCOUNTER — Ambulatory Visit
Admission: RE | Admit: 2018-06-11 | Discharge: 2018-06-11 | Disposition: A | Discharge status: Home / Self Care | Payer: 59 | Source: Ambulatory Visit | Attending: Radiation Oncology | Admitting: Radiation Oncology

## 2018-06-11 DIAGNOSIS — R252 Cramp and spasm: Secondary | ICD-10-CM | POA: Diagnosis not present

## 2018-06-11 DIAGNOSIS — D0511 Intraductal carcinoma in situ of right breast: Secondary | ICD-10-CM

## 2018-06-11 MED ORDER — ALRA NON-METALLIC DEODORANT (RAD-ONC)
1.0000 "application " | Freq: Once | TOPICAL | Status: AC
  Administered 2018-06-11: 1 via TOPICAL

## 2018-06-11 MED ORDER — RADIAPLEXRX EX GEL
Freq: Once | CUTANEOUS | Status: AC
  Administered 2018-06-11: 14:00:00 via TOPICAL

## 2018-06-11 NOTE — Progress Notes (Signed)

## 2018-06-12 ENCOUNTER — Ambulatory Visit
Admission: RE | Admit: 2018-06-12 | Discharge: 2018-06-12 | Disposition: A | Discharge status: Home / Self Care | Payer: 59 | Source: Ambulatory Visit | Attending: Radiation Oncology | Admitting: Radiation Oncology

## 2018-06-12 DIAGNOSIS — D0511 Intraductal carcinoma in situ of right breast: Secondary | ICD-10-CM | POA: Diagnosis not present

## 2018-06-13 ENCOUNTER — Ambulatory Visit
Admission: RE | Admit: 2018-06-13 | Discharge: 2018-06-13 | Disposition: A | Discharge status: Home / Self Care | Payer: 59 | Source: Ambulatory Visit | Attending: Radiation Oncology | Admitting: Radiation Oncology

## 2018-06-13 DIAGNOSIS — D0511 Intraductal carcinoma in situ of right breast: Secondary | ICD-10-CM | POA: Diagnosis not present

## 2018-06-14 ENCOUNTER — Ambulatory Visit
Admission: RE | Admit: 2018-06-14 | Discharge: 2018-06-14 | Disposition: A | Discharge status: Home / Self Care | Payer: 59 | Source: Ambulatory Visit | Attending: Radiation Oncology | Admitting: Radiation Oncology

## 2018-06-14 DIAGNOSIS — D0511 Intraductal carcinoma in situ of right breast: Secondary | ICD-10-CM | POA: Diagnosis not present

## 2018-06-15 ENCOUNTER — Encounter: Payer: Self-pay | Admitting: Physical Therapy

## 2018-06-15 ENCOUNTER — Ambulatory Visit
Admission: RE | Admit: 2018-06-15 | Discharge: 2018-06-15 | Disposition: A | Discharge status: Home / Self Care | Payer: 59 | Source: Ambulatory Visit | Attending: Radiation Oncology | Admitting: Radiation Oncology

## 2018-06-15 ENCOUNTER — Ambulatory Visit: Payer: 59 | Admitting: Physical Therapy

## 2018-06-15 DIAGNOSIS — R252 Cramp and spasm: Secondary | ICD-10-CM | POA: Diagnosis not present

## 2018-06-15 DIAGNOSIS — C50911 Malignant neoplasm of unspecified site of right female breast: Secondary | ICD-10-CM

## 2018-06-15 DIAGNOSIS — R279 Unspecified lack of coordination: Secondary | ICD-10-CM

## 2018-06-15 DIAGNOSIS — M6281 Muscle weakness (generalized): Secondary | ICD-10-CM

## 2018-06-15 DIAGNOSIS — D0511 Intraductal carcinoma in situ of right breast: Secondary | ICD-10-CM | POA: Diagnosis not present

## 2018-06-15 DIAGNOSIS — Z170 Estrogen receptor positive status [ER+]: Secondary | ICD-10-CM

## 2018-06-15 NOTE — Therapy (Signed)
[facility] [address], [place], [place], [address] Phone: [phone]   Fax:  [phone]  Physical Therapy Treatment  Patient Details  Name: Yvonne Wyatt MRN: [id number] Date of Birth: August 04, 1961 Referring Provider: Dr. Eppie Gibson   Encounter Date: 06/15/2018  PT End of Session - 06/15/18 0900    Visit Number  3    Date for PT Re-Evaluation  09/29/18    Authorization Type  UHC    Authorization - Visit Number  3    Authorization - Number of Visits  23    PT Start Time  0800    PT Stop Time  0850    PT Time Calculation (min)  50 min    Activity Tolerance  Patient tolerated treatment well;No increased pain    Behavior During Therapy  WFL for tasks assessed/performed       Past Medical History:  Diagnosis Date  . Complication of anesthesia   . Dyspareunia 05/20/2014   Early menopause age 31  . Fatigue   . GERD (gastroesophageal reflux disease)   . Headache   . Heterozygous factor V Leiden mutation ([place]) 05/20/2014   Dx 2007  No personal hx of thrombosis;  mother DVT age 54 on OC  . History of hiatal hernia   . Hypertension   . Hypothyroidism   . Osteopenia   . PONV (postoperative nausea and vomiting)   . Sleep apnea     Past Surgical History:  Procedure Laterality Date  . CESAREAN SECTION    . DIAGNOSTIC LAPAROSCOPY     for endometriosis  . GANGLION CYST EXCISION Right    right wrist  . INGUINAL HERNIA REPAIR     x 2 , at 18 months and 57 years old.   [name] RADIOACTIVE SEED GUIDED EXCISIONAL BREAST BIOPSY Right 04/30/2018   Procedure: RIGHT RADIOACTIVE SEED GUIDED EXCISIONAL BREAST BIOPSY ERAS PATHWAY;  Surgeon: Rolm Bookbinder, MD;  Location: [place];  Service: General;  Laterality: Right;  . SINUS EXPLORATION  1990    There were no vitals filed for this visit.  Subjective Assessment - 06/15/18 0807    Subjective  I have burining in the posterior aspect of the vagina. It burns most during  sitting. I started radiation on Monday.     Patient Stated Goals  ability to have penile penetration and vaginal exam    Currently in Pain?  Yes    Pain Score  1     Pain Location  Vagina    Pain Orientation  Mid    Pain Descriptors / Indicators  Pressure;Burning    Pain Type  Chronic pain    Pain Onset  More than a month ago    Pain Frequency  Intermittent    Aggravating Factors   penile penetration, wiping herself, sitting long periods of time, wearing a bathing suit    Pain Relieving Factors  stop the activity, generic micrastate    Multiple Pain Sites  No                       [facility] Adult PT Treatment/Exercise - 06/15/18 0001      Manual Therapy   Manual Therapy  Soft tissue mobilization;Internal Pelvic Floor    Manual therapy comments  education on how to perform perineal massage and keep the clitoral hood exposed    Soft tissue mobilization  along the clitoral hood to loosen the structure    Internal Pelvic  Floor  bulbocavernosus, perineal body, left levator ani       Trigger Point Dry Needling - 06/15/18 0857    Consent Given?  Yes    Muscles Treated Upper Body  -- perineal body, bulbocavernosus    Muscles Treated Lower Body  -- trigger point reponse, elongation of tissue           PT Education - 06/15/18 0856    Education Details  information on PT podcast; stretch the perineum, working on exposing the clitoral hood    Person(s) Educated  Patient    Methods  Explanation;Demonstration;Verbal cues;Handout    Comprehension  Verbalized understanding;Returned demonstration       PT Short Term Goals - 06/15/18 0904      PT SHORT TERM GOAL #1   Title  independent with initial HEP    Time  4    Period  Weeks    Status  Achieved      PT SHORT TERM GOAL #2   Title  understand how to perform perneal massage to soften tissue prior to vaginal exam in the next week    Time  4    Period  Weeks    Status  Achieved      PT SHORT TERM GOAL #3   Title   itching and burning reduce >/= 25% due to using vaginal moisturizers in the vaginal canal and vulva    Time  4    Period  Weeks    Status  On-going        PT Long Term Goals - 05/29/18 9678      PT LONG TERM GOAL #1   Title  independent with HEP and understand how to progress hersel    Time  4    Period  Months    Status  New    Target Date  09/29/18      PT LONG TERM GOAL #2   Title  abiltiy to have penile penetration with marinofff score 1/3 due to improve tissue health and mobility    Time  4    Period  Months    Status  New    Target Date  09/29/18      PT LONG TERM GOAL #3   Title  ability to sit with discomfort decreased >/= 75% due to improved vulva health    Time  4    Period  Months    Status  New    Target Date  06/26/18      PT LONG TERM GOAL #4   Title  ability to wipe herself with pain decreased >/= 75%    Time  4    Period  Months    Status  New    Target Date  09/29/18      PT LONG TERM GOAL #5   Title  abilty to sit for 1-2 hours with pain decreased >/= 75% due to improved tissue mobility    Time  4    Period  Months    Status  New    Target Date  09/29/18            Plan - 06/15/18 0811    Clinical Impression Statement  Patient is now walking daily.  Patient started her radiation treatment to right breast on 06/11/2018 and will have it for 4 weeks.  Patient has not had intercourse yet.  Patient gets her burning in the posterior fourchette when she is sitting.  Patient feels she  will tear easily in the area.  After manual work the perineal body transformed from gray color to pink color due to increased blood flow.  Perineal tissue was soft after manual work.  Patient will benefit from skilled therapy to reduce pelvic floor trigger points, reduce pelvic pain and improve coordination.     Rehab Potential  Excellent    Clinical Impairments Affecting Rehab Potential  s/p right breast cancer estrogen positive with radiation starting on 06/11/2018;  endometriosis, right and left hernia repair; c-section    PT Frequency  1x / week    PT Duration  Other (comment) 4 months    PT Treatment/Interventions  Biofeedback;Electrical Stimulation;Therapeutic activities;Therapeutic exercise;Patient/family education;Neuromuscular re-education;Manual techniques;Scar mobilization;Passive range of motion;Dry needling    PT Next Visit Plan  scar massage on the c-section scar, internal soft tissue work; work on urgency    PT Home Exercise Plan  Access Code: [email or web address]     [email or web address] and Agree with Plan of Care  Patient       Patient will benefit from skilled therapeutic intervention in order to improve the following deficits and impairments:  Increased fascial restricitons, Pain, Decreased coordination, Decreased mobility, Decreased scar mobility, Increased muscle spasms, Impaired tone, Decreased activity tolerance, Decreased endurance, Decreased range of motion, Decreased strength, Impaired flexibility  Visit Diagnosis: Cramp and spasm  Muscle weakness (generalized)  Unspecified lack of coordination  Malignant neoplasm of right breast in female, estrogen receptor positive, unspecified site of breast [facility])     Problem List Patient Active Problem List   Diagnosis Date Noted  . Ductal carcinoma in situ (DCIS) of right breast 05/15/2018  . Obstructive sleep apnea treated with continuous positive airway pressure (CPAP) 03/06/2018  . Heterozygous factor V Leiden mutation ([place]) 05/20/2014  . Dyspareunia 05/20/2014    Earlie Counts, PT 06/15/18 9:05 AM   [facility] [address], [place], [place], [address] Phone: [phone]   Fax:  [phone]  Name: Yvonne Wyatt MRN: [id number] Date of Birth: 27-Mar-1961

## 2018-06-15 NOTE — Patient Instructions (Addendum)
Podcast : PT below the waist Hormones and their affect on sexual health with Dr. Debbora Presto  STRETCHING THE PELVIC FLOOR MUSCLES NO DILATOR  Supplies . Vaginal lubricant . Mirror (optional) . Gloves (optional) Positioning . Start in a semi-reclined position with your head propped up. Bend your knees and place your thumb or finger at the vaginal opening. Procedure . Apply a moderate amount of lubricant on the outer skin of your vagina, the labia minora.  Apply additional lubricant to your finger. [name] Spread the skin away from the vaginal opening. Place the end of your finger at the opening. . Do a maximum contraction of the pelvic floor muscles. Tighten the vagina and the anus maximally and relax. . When you know they are relaxed, gently and slowly insert your finger into your vagina, directing your finger slightly downward, for 2-3 inches of insertion. . Relax and stretch the 6 o'clock position . Hold each stretch for _2 min__ and repeat __1_ time with rest breaks of _1__ seconds between each stretch. . Repeat the stretching in the 4 o'clock and 8 o'clock positions. . Total time should be _6__ minutes, _1__ x per day.  Note the amount of theme your were able to achieve and your tolerance to your finger in your vagina. . Once you have accomplished the techniques you may try them in standing with one foot resting on the tub, or in other positions.  This is a good stretch to do in the shower if you don't need to use lubricant.  Then twirl the perineal body.  [facility] [address] [place], [place] [address] Phone # [phone] Fax [phone]

## 2018-06-18 ENCOUNTER — Ambulatory Visit
Admission: RE | Admit: 2018-06-18 | Discharge: 2018-06-18 | Disposition: A | Discharge status: Home / Self Care | Payer: 59 | Source: Ambulatory Visit | Attending: Radiation Oncology | Admitting: Radiation Oncology

## 2018-06-18 DIAGNOSIS — D0511 Intraductal carcinoma in situ of right breast: Secondary | ICD-10-CM | POA: Diagnosis not present

## 2018-06-19 ENCOUNTER — Ambulatory Visit
Admission: RE | Admit: 2018-06-19 | Discharge: 2018-06-19 | Disposition: A | Discharge status: Home / Self Care | Payer: 59 | Source: Ambulatory Visit | Attending: Radiation Oncology | Admitting: Radiation Oncology

## 2018-06-19 DIAGNOSIS — D0511 Intraductal carcinoma in situ of right breast: Secondary | ICD-10-CM | POA: Diagnosis not present

## 2018-06-20 ENCOUNTER — Ambulatory Visit
Admission: RE | Admit: 2018-06-20 | Discharge: 2018-06-20 | Disposition: A | Discharge status: Home / Self Care | Payer: 59 | Source: Ambulatory Visit | Attending: Radiation Oncology | Admitting: Radiation Oncology

## 2018-06-20 DIAGNOSIS — D0511 Intraductal carcinoma in situ of right breast: Secondary | ICD-10-CM | POA: Diagnosis not present

## 2018-06-20 DIAGNOSIS — G4733 Obstructive sleep apnea (adult) (pediatric): Secondary | ICD-10-CM | POA: Diagnosis not present

## 2018-06-21 ENCOUNTER — Ambulatory Visit
Admission: RE | Admit: 2018-06-21 | Discharge: 2018-06-21 | Disposition: A | Discharge status: Home / Self Care | Payer: 59 | Source: Ambulatory Visit | Attending: Radiation Oncology | Admitting: Radiation Oncology

## 2018-06-21 DIAGNOSIS — D0511 Intraductal carcinoma in situ of right breast: Secondary | ICD-10-CM | POA: Insufficient documentation

## 2018-06-21 DIAGNOSIS — Z510 Encounter for antineoplastic radiation therapy: Secondary | ICD-10-CM | POA: Diagnosis not present

## 2018-06-21 DIAGNOSIS — Z170 Estrogen receptor positive status [ER+]: Secondary | ICD-10-CM | POA: Diagnosis not present

## 2018-06-22 ENCOUNTER — Encounter: Payer: Self-pay | Admitting: Physical Therapy

## 2018-06-22 ENCOUNTER — Ambulatory Visit
Admission: RE | Admit: 2018-06-22 | Discharge: 2018-06-22 | Disposition: A | Discharge status: Home / Self Care | Payer: 59 | Source: Ambulatory Visit | Attending: Radiation Oncology | Admitting: Radiation Oncology

## 2018-06-22 ENCOUNTER — Ambulatory Visit: Payer: 59 | Attending: Radiation Oncology | Admitting: Physical Therapy

## 2018-06-22 DIAGNOSIS — D0511 Intraductal carcinoma in situ of right breast: Secondary | ICD-10-CM | POA: Diagnosis not present

## 2018-06-22 DIAGNOSIS — M6281 Muscle weakness (generalized): Secondary | ICD-10-CM

## 2018-06-22 DIAGNOSIS — R279 Unspecified lack of coordination: Secondary | ICD-10-CM

## 2018-06-22 DIAGNOSIS — C50911 Malignant neoplasm of unspecified site of right female breast: Secondary | ICD-10-CM

## 2018-06-22 DIAGNOSIS — Z170 Estrogen receptor positive status [ER+]: Secondary | ICD-10-CM | POA: Diagnosis present

## 2018-06-22 DIAGNOSIS — R252 Cramp and spasm: Secondary | ICD-10-CM

## 2018-06-22 NOTE — Therapy (Signed)
[facility] [address] [place], [place], [address] Phone: [phone]   Fax:  [phone]  Physical Therapy Treatment  Patient Details  Name: Yvonne Wyatt MRN: [id number] Date of Birth: 09-15-1961 Referring Provider: Dr. Eppie Gibson   Encounter Date: 06/22/2018  PT End of Session - 06/22/18 0848    Visit Number  4    Date for PT Re-Evaluation  09/29/18    Authorization Type  UHC    Authorization - Visit Number  4    Authorization - Number of Visits  23    PT Start Time  0800    PT Stop Time  0845    PT Time Calculation (min)  45 min    Activity Tolerance  Patient tolerated treatment well;No increased pain    Behavior During Therapy  WFL for tasks assessed/performed       Past Medical History:  Diagnosis Date  . Complication of anesthesia   . Dyspareunia 05/20/2014   Early menopause age 73  . Fatigue   . GERD (gastroesophageal reflux disease)   . Headache   . Heterozygous factor V Leiden mutation ([place]) 05/20/2014   Dx 2007  No personal hx of thrombosis;  mother DVT age 55 on OC  . History of hiatal hernia   . Hypertension   . Hypothyroidism   . Osteopenia   . PONV (postoperative nausea and vomiting)   . Sleep apnea     Past Surgical History:  Procedure Laterality Date  . CESAREAN SECTION    . DIAGNOSTIC LAPAROSCOPY     for endometriosis  . GANGLION CYST EXCISION Right    right wrist  . INGUINAL HERNIA REPAIR     x 2 , at 18 months and 57 years old.   [name] RADIOACTIVE SEED GUIDED EXCISIONAL BREAST BIOPSY Right 04/30/2018   Procedure: RIGHT RADIOACTIVE SEED GUIDED EXCISIONAL BREAST BIOPSY ERAS PATHWAY;  Surgeon: Rolm Bookbinder, MD;  Location: [place];  Service: General;  Laterality: Right;  . SINUS EXPLORATION  1990    There were no vitals filed for this visit.  Subjective Assessment - 06/22/18 0801    Subjective  When I wipe I feel like I am getting a bunch of tears. I am using the Julva. I  feel the urgency is better. I am urinating less.     Patient Stated Goals  ability to have penile penetration and vaginal exam    Currently in Pain?  Yes    Pain Score  3     Pain Location  Vagina    Pain Orientation  Mid    Pain Descriptors / Indicators  Burning    Pain Type  Chronic pain    Pain Onset  More than a month ago    Pain Frequency  Intermittent    Aggravating Factors   penile penetration, wiping herself, sitting long periods of time, wearing a bathing suit    Pain Relieving Factors  stop the activity, generic micrastat    Multiple Pain Sites  No                       [facility] Adult PT Treatment/Exercise - 06/22/18 0001      Manual Therapy   Manual Therapy  Soft tissue mobilization;Myofascial release    Soft tissue mobilization  lower abdominal tissue to reduce the scar tissue and improve the mobility    Myofascial Release  along the c-section scar to release the fascia  Trigger Point Dry Needling - 06/22/18 0845    Consent Given?  Yes    Muscles Treated Lower Body  -- along the c-section scar, abdominal insertion into the pubic    Quadriceps Response  -- elongation of scar             PT Short Term Goals - 06/22/18 0844      PT SHORT TERM GOAL #3   Title  itching and burning reduce >/= 25% due to using vaginal moisturizers in the vaginal canal and vulva    Time  4    Period  Weeks    Status  Achieved        PT Long Term Goals - 06/22/18 0844      PT LONG TERM GOAL #1   Title  independent with HEP and understand how to progress hersel    Time  4    Period  Months    Status  On-going      PT LONG TERM GOAL #2   Title  abiltiy to have penile penetration with marinofff score 1/3 due to improve tissue health and mobility    Time  4    Period  Months    Status  On-going      PT LONG TERM GOAL #3   Title  ability to sit with discomfort decreased >/= 75% due to improved vulva health    Time  4    Period  Months    Status   On-going      PT LONG TERM GOAL #4   Title  ability to wipe herself with pain decreased >/= 75%    Time  4    Period  Months    Status  On-going      PT LONG TERM GOAL #5   Title  abilty to sit for 1-2 hours with pain decreased >/= 75% due to improved tissue mobility    Time  4    Period  Months    Status  On-going            Plan - 06/22/18 0804    Clinical Impression Statement  Patient reports a tear on the left perineal area that burns.  After manual work to c-section scar the burning reduced by 50%.  Patient had increased mobilty of the c-section scar by 50% after manual work.  Patient reports the urgency to void is 80% better.  Patient will benefit from skilled therapy to reduce pelvic floor trigger points, reduce pelvic pain and improve coordination.     Rehab Potential  Excellent    Clinical Impairments Affecting Rehab Potential  s/p right breast cancer estrogen positive with radiation starting on 06/11/2018; endometriosis, right and left hernia repair; c-section    PT Frequency  1x / week    PT Duration  Other (comment) 4 monhts    PT Treatment/Interventions  Biofeedback;Electrical Stimulation;Therapeutic activities;Therapeutic exercise;Patient/family education;Neuromuscular re-education;Manual techniques;Scar mobilization;Passive range of motion;Dry needling    PT Next Visit Plan  hip adductor, , internal soft tissue work; update LTG, check hip ER strength    PT Home Exercise Plan  Access Code: [email or web address]     Consulted and Agree with Plan of Care  Patient       Patient will benefit from skilled therapeutic intervention in order to improve the following deficits and impairments:  Increased fascial restricitons, Pain, Decreased coordination, Decreased mobility, Decreased scar mobility, Increased muscle spasms, Impaired tone, Decreased activity tolerance, Decreased endurance, Decreased range of  motion, Decreased strength, Impaired flexibility  Visit Diagnosis: Cramp and  spasm  Muscle weakness (generalized)  Unspecified lack of coordination  Malignant neoplasm of right breast in female, estrogen receptor positive, unspecified site of breast [facility])     Problem List Patient Active Problem List   Diagnosis Date Noted  . Ductal carcinoma in situ (DCIS) of right breast 05/15/2018  . Obstructive sleep apnea treated with continuous positive airway pressure (CPAP) 03/06/2018  . Heterozygous factor V Leiden mutation ([place]) 05/20/2014  . Dyspareunia 05/20/2014    Earlie Counts, PT 06/22/18 8:51 AM   [facility] [address], [place], [place], [address] Phone: [phone]   Fax:  [phone]  Name: MINNETTA SANDORA MRN: [id number] Date of Birth: 1961/08/17

## 2018-06-25 ENCOUNTER — Ambulatory Visit: Payer: 59 | Admitting: Radiation Oncology

## 2018-06-25 ENCOUNTER — Ambulatory Visit
Admission: RE | Admit: 2018-06-25 | Discharge: 2018-06-25 | Disposition: A | Discharge status: Home / Self Care | Payer: 59 | Source: Ambulatory Visit | Attending: Radiation Oncology | Admitting: Radiation Oncology

## 2018-06-25 DIAGNOSIS — D0511 Intraductal carcinoma in situ of right breast: Secondary | ICD-10-CM | POA: Diagnosis not present

## 2018-06-26 ENCOUNTER — Ambulatory Visit
Admission: RE | Admit: 2018-06-26 | Discharge: 2018-06-26 | Disposition: A | Discharge status: Home / Self Care | Payer: 59 | Source: Ambulatory Visit | Attending: Radiation Oncology | Admitting: Radiation Oncology

## 2018-06-26 DIAGNOSIS — D0511 Intraductal carcinoma in situ of right breast: Secondary | ICD-10-CM

## 2018-06-27 ENCOUNTER — Ambulatory Visit
Admission: RE | Admit: 2018-06-27 | Discharge: 2018-06-27 | Disposition: A | Discharge status: Home / Self Care | Payer: 59 | Source: Ambulatory Visit | Attending: Radiation Oncology | Admitting: Radiation Oncology

## 2018-06-27 DIAGNOSIS — D0511 Intraductal carcinoma in situ of right breast: Secondary | ICD-10-CM | POA: Diagnosis not present

## 2018-06-28 ENCOUNTER — Ambulatory Visit
Admission: RE | Admit: 2018-06-28 | Discharge: 2018-06-28 | Disposition: A | Discharge status: Home / Self Care | Payer: 59 | Source: Ambulatory Visit | Attending: Radiation Oncology | Admitting: Radiation Oncology

## 2018-06-28 ENCOUNTER — Telehealth: Payer: Self-pay | Admitting: *Deleted

## 2018-06-28 DIAGNOSIS — D0511 Intraductal carcinoma in situ of right breast: Secondary | ICD-10-CM | POA: Diagnosis not present

## 2018-06-28 NOTE — Telephone Encounter (Signed)
CALLED PATIENT TO INFORM THAT DR. SQUIRE WILL BE SEEING HER PUTS ON 07-02-18, LVM FOR A RETURN CALL

## 2018-06-29 ENCOUNTER — Ambulatory Visit
Admission: RE | Admit: 2018-06-29 | Discharge: 2018-06-29 | Disposition: A | Discharge status: Home / Self Care | Payer: 59 | Source: Ambulatory Visit | Attending: Radiation Oncology | Admitting: Radiation Oncology

## 2018-06-29 DIAGNOSIS — D0511 Intraductal carcinoma in situ of right breast: Secondary | ICD-10-CM | POA: Diagnosis not present

## 2018-07-02 ENCOUNTER — Ambulatory Visit
Admission: RE | Admit: 2018-07-02 | Discharge: 2018-07-02 | Disposition: A | Discharge status: Home / Self Care | Payer: 59 | Source: Ambulatory Visit | Attending: Radiation Oncology | Admitting: Radiation Oncology

## 2018-07-02 DIAGNOSIS — D0511 Intraductal carcinoma in situ of right breast: Secondary | ICD-10-CM | POA: Diagnosis not present

## 2018-07-02 DIAGNOSIS — R252 Cramp and spasm: Secondary | ICD-10-CM | POA: Diagnosis not present

## 2018-07-03 ENCOUNTER — Ambulatory Visit
Admission: RE | Admit: 2018-07-03 | Discharge: 2018-07-03 | Disposition: A | Discharge status: Home / Self Care | Payer: 59 | Source: Ambulatory Visit | Attending: Radiation Oncology | Admitting: Radiation Oncology

## 2018-07-03 DIAGNOSIS — D0511 Intraductal carcinoma in situ of right breast: Secondary | ICD-10-CM | POA: Diagnosis not present

## 2018-07-04 ENCOUNTER — Ambulatory Visit
Admission: RE | Admit: 2018-07-04 | Discharge: 2018-07-04 | Disposition: A | Discharge status: Home / Self Care | Payer: 59 | Source: Ambulatory Visit | Attending: Radiation Oncology | Admitting: Radiation Oncology

## 2018-07-04 ENCOUNTER — Ambulatory Visit: Payer: 59 | Admitting: Physical Therapy

## 2018-07-04 ENCOUNTER — Encounter: Payer: Self-pay | Admitting: Physical Therapy

## 2018-07-04 DIAGNOSIS — R279 Unspecified lack of coordination: Secondary | ICD-10-CM

## 2018-07-04 DIAGNOSIS — C50911 Malignant neoplasm of unspecified site of right female breast: Secondary | ICD-10-CM

## 2018-07-04 DIAGNOSIS — R252 Cramp and spasm: Secondary | ICD-10-CM

## 2018-07-04 DIAGNOSIS — D0511 Intraductal carcinoma in situ of right breast: Secondary | ICD-10-CM | POA: Diagnosis not present

## 2018-07-04 DIAGNOSIS — M6281 Muscle weakness (generalized): Secondary | ICD-10-CM

## 2018-07-04 DIAGNOSIS — Z170 Estrogen receptor positive status [ER+]: Secondary | ICD-10-CM

## 2018-07-04 NOTE — Patient Instructions (Signed)
PROTOCOL FOR DILATORS   1. Wash dilator with soap and water prior to insertion.    2. Lay on your back reclined. Knees are to be up and apart while on your bed or in the bathtub with warm water.   3. Lubricate the end of the dilator with a water-soluble lubricant.  4. Separate the labia.   5. Tense the pelvic floor muscles than relax; while relaxing, slide lubricated dilator into the vagina.    6. Tense muscles again while holding the dilator so it does not get pushed out; relax and slide it in a little further.   7. Try blowing out as if filling a balloon; this may relax the muscles and allow penetration.  Repeat blowing out to insert dilator further.  8. Keep dilator in for 10 minutes if tolerate, with the pelvic floor muscles relaxed to further stretch the canal.   9. Never force the dilator into the canal.  10. 1-2 times per day [facility] [address], [place], [place] [address] Phone # [phone] Fax [phone]  [employer] Dr Vickey Sages Intimate Basics Monia Pouch - 2 Piece Vibrating Silicone Dilators Set With Ergonomic Finger Loop - Waterproof [employer] - Purple

## 2018-07-04 NOTE — Therapy (Signed)
[facility] [address] [place], [place], [address] Phone: [phone]   Fax:  [phone]  Physical Therapy Treatment  Patient Details  Name: Yvonne Wyatt MRN: [id number] Date of Birth: 01-26-1961 Referring Provider: Dr. Eppie Gibson   Encounter Date: 07/04/2018  PT End of Session - 07/04/18 1014    Visit Number  5    Date for PT Re-Evaluation  09/29/18    Authorization Type  UHC    Authorization - Visit Number  5    Authorization - Number of Visits  23    PT Start Time  0930    PT Stop Time  1012    PT Time Calculation (min)  42 min    Activity Tolerance  Patient tolerated treatment well;No increased pain    Behavior During Therapy  WFL for tasks assessed/performed       Past Medical History:  Diagnosis Date  . Complication of anesthesia   . Dyspareunia 05/20/2014   Early menopause age 42  . Fatigue   . GERD (gastroesophageal reflux disease)   . Headache   . Heterozygous factor V Leiden mutation ([place]) 05/20/2014   Dx 2007  No personal hx of thrombosis;  mother DVT age 27 on [place]  . History of hiatal hernia   . Hypertension   . Hypothyroidism   . Osteopenia   . PONV (postoperative nausea and vomiting)   . Sleep apnea     Past Surgical History:  Procedure Laterality Date  . CESAREAN SECTION    . DIAGNOSTIC LAPAROSCOPY     for endometriosis  . GANGLION CYST EXCISION Right    right wrist  . INGUINAL HERNIA REPAIR     x 2 , at 18 months and 58 years old.   [name] RADIOACTIVE SEED GUIDED EXCISIONAL BREAST BIOPSY Right 04/30/2018   Procedure: RIGHT RADIOACTIVE SEED GUIDED EXCISIONAL BREAST BIOPSY ERAS PATHWAY;  Surgeon: Rolm Bookbinder, MD;  Location: [place];  Service: General;  Laterality: Right;  . SINUS EXPLORATION  1990    There were no vitals filed for this visit.  Subjective Assessment - 07/04/18 0933    Subjective  I had a few days that was urgency for 2 days. I have some burning with  moisturizers.  I am using the revelum at the open.  I have healed from the tear. I am hot today.     Patient Stated Goals  ability to have penile penetration and vaginal exam    Currently in Pain?  Yes    Pain Score  2     Pain Location  Vagina    Pain Orientation  Mid    Pain Descriptors / Indicators  Burning    Pain Type  Chronic pain    Pain Onset  More than a month ago    Pain Frequency  Intermittent    Aggravating Factors   penile penetraion, wiping herself, sitting long periods of time wearing a bathing suit    Pain Relieving Factors  stop the activity, creams    Multiple Pain Sites  No         [facility] PT Assessment - 07/04/18 0001      Strength   Right Hip External Rotation   4/5    Left Hip External Rotation  4/5                Pelvic Floor Special Questions - 07/04/18 0001    Pelvic Floor Internal Exam  Patient confirms  identification and approves PT to treat pelvic floor    Exam Type  Vaginal    Strength  fair squeeze, definite lift        [facility] Adult PT Treatment/Exercise - 07/04/18 0001      Manual Therapy   Manual Therapy  Soft tissue mobilization;Internal Pelvic Floor    Manual therapy comments  scar massage along the c-section scar    Myofascial Release  along the perineal body    Internal Pelvic Floor  along the posterior introitus             PT Education - 07/04/18 1013    Education Details  information on dilators    Person(s) Educated  Patient    Methods  Explanation;Handout    Comprehension  Verbalized understanding       PT Short Term Goals - 06/22/18 0844      PT SHORT TERM GOAL #3   Title  itching and burning reduce >/= 25% due to using vaginal moisturizers in the vaginal canal and vulva    Time  4    Period  Weeks    Status  Achieved        PT Long Term Goals - 07/04/18 0936      PT LONG TERM GOAL #1   Title  independent with HEP and understand how to progress hersel    Baseline  still learning    Time  4     Period  Months    Status  On-going      PT LONG TERM GOAL #2   Title  abiltiy to have penile penetration with marinofff score 1/3 due to improve tissue health and mobility    Time  4    Period  Months    Status  On-going      PT LONG TERM GOAL #3   Title  ability to sit with discomfort decreased >/= 75% due to improved vulva health    Baseline  50% better    Time  4    Period  Months    Status  On-going      PT LONG TERM GOAL #4   Title  ability to wipe herself with pain decreased >/= 75%    Baseline  50% better    Time  4    Period  Months    Status  On-going      PT LONG TERM GOAL #5   Title  abilty to sit for 1-2 hours with pain decreased >/= 75% due to improved tissue mobility    Time  4    Period  Months    Status  On-going            Plan - 07/04/18 1014    Clinical Impression Statement  Pelvic floor strength increased to 3/5. Patient has improved mobility of c-section scar and is no as indented.  Patient is 50% better with wiping and sitting.  Patient has increased hip external rotation strength.  Patient will benefit from skilled therapy to reduce pelvic trigger points, reduce pelvic pain and improve coordination.     Rehab Potential  Excellent    Clinical Impairments Affecting Rehab Potential  s/p right breast cancer estrogen positive with radiation starting on 06/11/2018; endometriosis, right and left hernia repair; c-section    PT Frequency  1x / week    PT Duration  Other (comment)   4 months   PT Treatment/Interventions  Biofeedback;Electrical Stimulation;Therapeutic activities;Therapeutic exercise;Patient/family education;Neuromuscular re-education;Manual techniques;Scar mobilization;Passive  range of motion;Dry needling    PT Next Visit Plan  internal soft tissue work to posterior introitus    PT Home Exercise Plan  Access Code: [email or web address]     [email or web address] and Agree with Plan of Care  Patient       Patient will benefit from skilled therapeutic intervention in  order to improve the following deficits and impairments:  Increased fascial restricitons, Pain, Decreased coordination, Decreased mobility, Decreased scar mobility, Increased muscle spasms, Impaired tone, Decreased activity tolerance, Decreased endurance, Decreased range of motion, Decreased strength, Impaired flexibility  Visit Diagnosis: Cramp and spasm  Muscle weakness (generalized)  Unspecified lack of coordination  Malignant neoplasm of right breast in female, estrogen receptor positive, unspecified site of breast [facility])     Problem List Patient Active Problem List   Diagnosis Date Noted  . Ductal carcinoma in situ (DCIS) of right breast 05/15/2018  . Obstructive sleep apnea treated with continuous positive airway pressure (CPAP) 03/06/2018  . Heterozygous factor V Leiden mutation ([place]) 05/20/2014  . Dyspareunia 05/20/2014    Earlie Counts, PT 07/04/18 10:17 AM   [facility] [address], [place], [place], [address] Phone: [phone]   Fax:  [phone]  Name: MAKYNNA MANOCCHIO MRN: [id number] Date of Birth: Nov 13, 1961

## 2018-07-05 ENCOUNTER — Encounter: Payer: 59 | Admitting: Physical Therapy

## 2018-07-05 ENCOUNTER — Ambulatory Visit
Admission: RE | Admit: 2018-07-05 | Discharge: 2018-07-05 | Disposition: A | Discharge status: Home / Self Care | Payer: 59 | Source: Ambulatory Visit | Attending: Radiation Oncology | Admitting: Radiation Oncology

## 2018-07-05 DIAGNOSIS — D0511 Intraductal carcinoma in situ of right breast: Secondary | ICD-10-CM | POA: Diagnosis not present

## 2018-07-06 ENCOUNTER — Ambulatory Visit
Admission: RE | Admit: 2018-07-06 | Discharge: 2018-07-06 | Disposition: A | Discharge status: Home / Self Care | Payer: 59 | Source: Ambulatory Visit | Attending: Radiation Oncology | Admitting: Radiation Oncology

## 2018-07-06 DIAGNOSIS — D0511 Intraductal carcinoma in situ of right breast: Secondary | ICD-10-CM | POA: Diagnosis not present

## 2018-07-08 ENCOUNTER — Ambulatory Visit: Admission: RE | Admit: 2018-07-08 | Payer: 59 | Source: Ambulatory Visit

## 2018-07-10 ENCOUNTER — Encounter: Payer: Self-pay | Admitting: Radiation Oncology

## 2018-07-10 NOTE — Progress Notes (Signed)
  Radiation Oncology         [phone] ________________________________  Name: Yvonne Wyatt MRN: [id number]  Date: 07/10/2018  DOB: 1961-07-27  End of Treatment Note  Diagnosis:   Cancer Staging Ductal carcinoma in situ (DCIS) of right breast Staging form: Breast, AJCC 8th Edition - Clinical: No stage assigned - Unsigned - Pathologic: Stage 0 (pTis (DCIS), pN0, cM0, ER+, PR+, HER2: Not Assessed) - Signed by Eppie Gibson, MD on 05/15/2018      Indication for treatment:  Curative       Radiation treatment dates:   06/11/2018-07/06/2018  Site/dose:   1. Right breast, 2.67 Gy x 15 fractions for a total dose of 40.05 Gy           2. Right breast boost, 2 Gy x 5 fractions for a total dose of 10 Gy  Beams/energy:   1. 3D, 10X /6X        2. 3D, 6X  Narrative: The patient tolerated radiation treatment relatively well. At the beginning of her treatment, she denied any issues. She was given Radiaplex and instructed on its use. Towards the end of her treatment, she noted itching and burning to her  treatment site. She was using hydrocortisone cream 1% as well as Radiaplex. On PE, it was noted erythema of the right breast, skin intact.   Plan: The patient has completed radiation treatment. The patient will return to radiation oncology clinic for routine followup in one month. I advised them to call or return sooner if they have any questions or concerns related to their recovery or treatment.  -----------------------------------  Eppie Gibson, MD   This document serves as a record of services personally performed by Eppie Gibson, MD. It was created on her behalf by Steva Colder, a trained medical scribe. The creation of this record is based on the scribe's personal observations and the provider's statements to them. This document has been checked and approved by the attending provider.

## 2018-07-13 ENCOUNTER — Encounter: Payer: Self-pay | Admitting: Physical Therapy

## 2018-07-13 ENCOUNTER — Telehealth: Payer: Self-pay

## 2018-07-13 ENCOUNTER — Ambulatory Visit: Payer: 59 | Admitting: Physical Therapy

## 2018-07-13 DIAGNOSIS — Z170 Estrogen receptor positive status [ER+]: Secondary | ICD-10-CM

## 2018-07-13 DIAGNOSIS — R252 Cramp and spasm: Secondary | ICD-10-CM | POA: Diagnosis not present

## 2018-07-13 DIAGNOSIS — M6281 Muscle weakness (generalized): Secondary | ICD-10-CM

## 2018-07-13 DIAGNOSIS — R279 Unspecified lack of coordination: Secondary | ICD-10-CM

## 2018-07-13 DIAGNOSIS — C50911 Malignant neoplasm of unspecified site of right female breast: Secondary | ICD-10-CM

## 2018-07-13 NOTE — Patient Instructions (Signed)
PROTOCOL FOR DILATORS   1. Wash dilator with soap and water prior to insertion.    2. Lay on your back reclined. Knees are to be up and apart while on your bed or in the bathtub with warm water.   3. Lubricate the end of the dilator with a water-soluble lubricant.  4. Separate the labia.   5. Tense the pelvic floor muscles than relax; while relaxing, slide lubricated dilator into the vagina.    6. Tense muscles again while holding the dilator so it does not get pushed out; relax and slide it in a little further.   7. Try blowing out as if filling a balloon; this may relax the muscles and allow penetration.  Repeat blowing out to insert dilator further.  8. Keep dilator in for 10 minutes if tolerate, with the pelvic floor muscles relaxed to further stretch the canal. No more than 3/10 pain.   9. Never force the dilator into the canal.  10. 1-2 times per day  Next step is you move the dilator around  And hips  Once you can use the dilator without 3/10 progress you progress to next size.  To progress to next size you first use the smaller size for 2 min then use the larger size. Repeat as above.   [facility] [address] [place], [place] [address] Phone # [phone] Fax [phone]

## 2018-07-13 NOTE — Therapy (Signed)
[facility] [address] [place], [place], [address] Phone: [phone]   Fax:  [phone]  Physical Therapy Treatment  Patient Details  Name: Yvonne Wyatt MRN: [id number] Date of Birth: Apr 19, 1961 Referring Provider: Dr. Eppie Gibson   Encounter Date: 07/13/2018  PT End of Session - 07/13/18 0758    Visit Number  6    Date for PT Re-Evaluation  09/29/18    Authorization Type  [employer]    Authorization - Visit Number  6    Authorization - Number of Visits  23    PT Start Time  0800    PT Stop Time  0840    PT Time Calculation (min)  40 min    Activity Tolerance  Patient tolerated treatment well;No increased pain    Behavior During Therapy  WFL for tasks assessed/performed       Past Medical History:  Diagnosis Date  . Complication of anesthesia   . Dyspareunia 05/20/2014   Early menopause age 54  . Fatigue   . GERD (gastroesophageal reflux disease)   . Headache   . Heterozygous factor V Leiden mutation ([place]) 05/20/2014   Dx 2007  No personal hx of thrombosis;  mother DVT age 67 on OC  . History of hiatal hernia   . Hypertension   . Hypothyroidism   . Osteopenia   . PONV (postoperative nausea and vomiting)   . Sleep apnea     Past Surgical History:  Procedure Laterality Date  . CESAREAN SECTION    . DIAGNOSTIC LAPAROSCOPY     for endometriosis  . GANGLION CYST EXCISION Right    right wrist  . INGUINAL HERNIA REPAIR     x 2 , at 18 months and 57 years old.   [name] RADIOACTIVE SEED GUIDED EXCISIONAL BREAST BIOPSY Right 04/30/2018   Procedure: RIGHT RADIOACTIVE SEED GUIDED EXCISIONAL BREAST BIOPSY ERAS PATHWAY;  Surgeon: Rolm Bookbinder, MD;  Location: [place];  Service: General;  Laterality: Right;  . SINUS EXPLORATION  1990    There were no vitals filed for this visit.  Subjective Assessment - 07/13/18 0801    Subjective  Yesterday and today I am burning alot today.     Patient Stated Goals  ability  to have penile penetration and vaginal exam    Currently in Pain?  Yes    Pain Location  Vagina    Pain Orientation  Mid    Pain Descriptors / Indicators  Burning    Pain Type  Chronic pain    Pain Onset  More than a month ago    Pain Frequency  Intermittent    Aggravating Factors   penile penetration, wiping herself, sitting long periods of time wearing a bathing suit    Pain Relieving Factors  stop the activity, creams    Multiple Pain Sites  No                    Pelvic Floor Special Questions - 07/13/18 0001    Pelvic Floor Internal Exam  Patient confirms identification and approves PT to treat pelvic floor    Exam Type  Vaginal        [facility] Adult PT Treatment/Exercise - 07/13/18 0001      Self-Care   Self-Care  Other Self-Care Comments    Other Self-Care Comments   how to use dilator and progress herself      Manual Therapy   Manual Therapy  Internal Pelvic  Floor    Internal Pelvic Floor  using [employer] cream on the vulva area performed myofascial release with one finge in the vaginal canal and other finger on the outside working on the perineal body, bulbocavernosus, and levator ani       Trigger Point Dry Needling - 07/13/18 0814    Consent Given?  Yes    Muscles Treated Upper Body  --   perineal body, bulbocavernosus   Muscles Treated Lower Body  --   trigger point response, elongation of tissue          PT Education - 07/13/18 0848    Education Details  information on dilators and progression    Person(s) Educated  Patient    Methods  Explanation;Demonstration;Handout    Comprehension  Verbalized understanding       PT Short Term Goals - 06/22/18 0844      PT SHORT TERM GOAL #3   Title  itching and burning reduce >/= 25% due to using vaginal moisturizers in the vaginal canal and vulva    Time  4    Period  Weeks    Status  Achieved        PT Long Term Goals - 07/13/18 5809      PT LONG TERM GOAL #1   Title  independent  with HEP and understand how to progress hersel    Baseline  still learning    Time  4    Period  Months    Status  On-going      PT LONG TERM GOAL #2   Title  abiltiy to have penile penetration with marinofff score 1/3 due to improve tissue health and mobility    Time  4    Period  Months    Status  On-going      PT LONG TERM GOAL #3   Title  ability to sit with discomfort decreased >/= 75% due to improved vulva health    Baseline  50% better    Time  4    Period  Months    Status  On-going      PT LONG TERM GOAL #4   Title  ability to wipe herself with pain decreased >/= 75%    Baseline  50% better    Time  4    Period  Months    Status  On-going      PT LONG TERM GOAL #5   Title  abilty to sit for 1-2 hours with pain decreased >/= 75% due to improved tissue mobility    Time  4    Period  Months    Status  On-going            Plan - 07/13/18 0759    Clinical Impression Statement  After manual work patient burning was 80% better.  Patient vulva area was very red . Patient finished her radiation for her breast and she may have a reaction to the vulva area from it.  Patient will be getting the dilator in a few days and understand how to use it.  Patient will benefit from skilled therapy to reduce pelvic trigger points, reduce pelvic pain and improve coordination.     Rehab Potential  Excellent    Clinical Impairments Affecting Rehab Potential  s/p right breast cancer estrogen positive with radiation starting on 06/11/2018; endometriosis, right and left hernia repair; c-section    PT Frequency  1x / week    PT Duration  Other (  comment)   4 months   PT Treatment/Interventions  Biofeedback;Electrical Stimulation;Therapeutic activities;Therapeutic exercise;Patient/family education;Neuromuscular re-education;Manual techniques;Scar mobilization;Passive range of motion;Dry needling    PT Next Visit Plan  internal soft tissue work to posterior introitus; take measurements    PT  Home Exercise Plan  Access Code: [email or web address]     Consulted and Agree with Plan of Care  Patient       Patient will benefit from skilled therapeutic intervention in order to improve the following deficits and impairments:  Increased fascial restricitons, Pain, Decreased coordination, Decreased mobility, Decreased scar mobility, Increased muscle spasms, Impaired tone, Decreased activity tolerance, Decreased endurance, Decreased range of motion, Decreased strength, Impaired flexibility  Visit Diagnosis: Cramp and spasm  Muscle weakness (generalized)  Unspecified lack of coordination  Malignant neoplasm of right breast in female, estrogen receptor positive, unspecified site of breast [facility])     Problem List Patient Active Problem List   Diagnosis Date Noted  . Ductal carcinoma in situ (DCIS) of right breast 05/15/2018  . Obstructive sleep apnea treated with continuous positive airway pressure (CPAP) 03/06/2018  . Heterozygous factor V Leiden mutation ([place]) 05/20/2014  . Dyspareunia 05/20/2014    Earlie Counts, PT 07/13/18 8:53 AM   [facility] [address] [place], [place], [address] Phone: [phone]   Fax:  [phone]  Name: Yvonne Wyatt MRN: [id number] Date of Birth: 10-23-1961

## 2018-07-13 NOTE — Telephone Encounter (Signed)
Yvonne Wyatt called me concerned about increased redness and irritation at her right nipple. I notified Dr. Isidore Moos and informed Yvonne Wyatt of her response. I informed Yvonne Wyatt that skin irritation can worsen in the week after radiation before showing signs of improvement. Her right nipple did get a low dose of radiation during her boost treatment in the final week so that could delay healing. Yvonne Wyatt can use neosporin to the right nipple. Yvonne Wyatt was informed that if she felt like her irritation was worsening she should call me on Monday and we will have her see Dr. Isidore Moos that day. She has my direct number to contact me if needed.

## 2018-07-19 NOTE — Progress Notes (Signed)
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 She underwent bilateral diagnostic mammography with tomography and bilateral breast ultrasonography at [facility] on 03/19/2018 showing: breast density category B,  Persistent distortion in the Upper-Outer Quadrant of the Right breast without sonographic correlate. No axillary adenopathy by ultrasound. Numerous bilateral simple breast cysts. Stereotactic guided core biopsy was recommended.  Accordingly on 03/19/2018 she proceeded to biopsy of the right breast area in question. The pathology from this procedure showed ([id number]): complex sclerosing lesion with ductal hyperplasia. Fibrocystic changes with usual ductal hyperplasia.   She was then referred to surgery and after appropriate discussion on 04/30/2018 she underwent a right breast lumpectomy, showing extensive ductal carcinoma IN SITU (DCIS), intermediate nuclear grade with extension to multiple foci of sclerosing adenosis and complex sclerosing lesions. Negative for invasive carcinoma. All resection margins are negative for DCIS; closest is the anterior margin at 1.5 mm. Both medial and lateral margins are at 3 mm.  The prognostic panel showed the estrogen  receptor at 100%, with strong staining intensity, estrogen receptor 90% positive, strong staining intensity. Stage 0 (pTis, pNX)  The patient's subsequent history is as detailed below.  INTERVAL HISTORY: Jelisa returns today for follow up and treatment of her estrogen receptor positive non-invasive cancer, accompanied by her husband. She completed adjuvant radiation on 07/06/2018. She tolerated radiation well. She feels fatigued. She had some skin peeling in the affected area. She also has some sensitivity, pinching, and slight burning sensations in the right breast. She denies skin redness.   REVIEW OF SYSTEMS: Maurina takes naps when she is tired. She visited her mother this past weekend for Labor Day. She denies unusual headaches, visual changes, nausea, vomiting, or dizziness. There has been no unusual cough, phlegm production, or pleurisy. There has been no change in bowel or bladder habits. She denies unexplained fatigue or unexplained weight loss, bleeding, rash, or fever. A detailed review of systems was otherwise stable.    PAST MEDICAL HISTORY: Past Medical History:  Diagnosis Date  . Complication of anesthesia   . Dyspareunia 05/20/2014   Early menopause age 60  . Fatigue   . GERD (gastroesophageal reflux disease)   . Headache   . Heterozygous factor V Leiden mutation ([place]) 05/20/2014   Dx 2007  No personal hx of thrombosis;  mother DVT age 55 on OC  . History of hiatal hernia   . Hypertension   . Hypothyroidism   . Osteopenia   . PONV (postoperative nausea and vomiting)   . Sleep apnea     PAST SURGICAL HISTORY: Past Surgical History:  Procedure Laterality Date  . CESAREAN SECTION    . DIAGNOSTIC LAPAROSCOPY     for endometriosis  . GANGLION CYST EXCISION Right    right wrist  . INGUINAL HERNIA REPAIR     x 2 , at 18 months and  57 years old.   [name] RADIOACTIVE SEED GUIDED EXCISIONAL BREAST BIOPSY Right 04/30/2018   Procedure: RIGHT RADIOACTIVE SEED GUIDED EXCISIONAL  BREAST BIOPSY ERAS PATHWAY;  Surgeon: Rolm Bookbinder, MD;  Location: [place];  Service: General;  Laterality: Right;  . SINUS EXPLORATION  1990    FAMILY HISTORY Family History  Problem Relation Age of Onset  . CAD Mother   . Deep vein thrombosis Mother   . Hyperlipidemia Mother   . COPD Mother   . AAA (abdominal aortic aneurysm) Mother   . Dementia Mother   . COPD Father   . CVA Brother   . Diabetes Brother   . Hyperlipidemia Brother   The patient's father died at the age of 79 from emphysema.  The patient's mother is still living as of July 2019, age 34.  She had a history of superficial clots and some cardiovascular disease.  The patient had one brother who died at age 35 from a stroke.  The patient had 2 sisters.  There is no history of breast cancer ovarian cancer or other clotting history in the family to her knowledge  GYNECOLOGIC HISTORY:  No LMP recorded. Patient is postmenopausal. Menarche: 57 years old Age at first live birth: 57 years old [place] P 1 LMP was at 57 years old Took oral contraceptives for many years (history of endometriosis) HRT no  Hysterectomy? No  BSO? No  SOCIAL HISTORY: (As of July 2019) She is a Marine scientist at [employer]. Her husband, Gershon Mussel, builds Medical sales representative for [employer]. Their son Barnabas Lister, 30, is a Engineer, building services. Gershon Mussel has two children from a pervious marriage, Raquel Sarna, who lives in [place], and Timber Hills, lives in [place].  There are no grandchildren.  Pinkie attends a [employer].    ADVANCED DIRECTIVES: The patient's husband is her healthcare power of attorney   HEALTH MAINTENANCE: Social History   Tobacco Use  . Smoking status: Never Smoker  . Smokeless tobacco: Never Used  Substance Use Topics  . Alcohol use: Yes    Comment: occasional  . Drug use: No     Colonoscopy: UTD/ Magod  PAP: UTD/ Silva  Bone density: 04/25/2016, t-score of -0.9   Allergies  Allergen Reactions  . Codeine Hives and Rash    Current Outpatient Medications    Medication Sig Dispense Refill  . Acetaminophen (TYLENOL EXTRA STRENGTH PO) Take 1,000 mg by mouth daily as needed (headache/pain).     [name] aspirin EC 81 MG tablet Take 81 mg by mouth daily.    . Calcium Citrate (CITRACAL PO) Take 2 tablets by mouth daily.     [name] escitalopram (LEXAPRO) 10 MG tablet Take 10 mg by mouth at bedtime.   6  . FOLIC ACID PO Take 5,631 mcg by mouth daily.     . Hydrocortisone (PROCTOSOL HC RE) Place 1 application rectally 2 (two) times daily as needed (hemmorrhoids).    [name] levothyroxine (SYNTHROID, LEVOTHROID) 50 MCG tablet Take 50 mcg by mouth daily before breakfast.    . lisinopril (PRINIVIL,ZESTRIL) 20 MG tablet Take 20 mg by mouth daily.  11  . Lysine 500 MG TABS Take 500 mg by mouth daily.     . Melatonin 3 MG CAPS Take 3 mg by mouth at bedtime.     . Multiple Vitamin (MULTIVITAMIN WITH MINERALS) TABS tablet Take 1 tablet by mouth daily.    [name] nystatin-triamcinolone ointment (MYCOLOG) Apply 1 application topically 2 (two) times daily as needed.    . pantoprazole (PROTONIX) 40 MG  tablet Take 40 mg by mouth daily.    . Probiotic Product (ALIGN PO) Take 1 tablet by mouth daily.     . ranitidine (ZANTAC) 150 MG tablet Take 150 mg by mouth 2 (two) times daily.    . valACYclovir (VALTREX) 1000 MG tablet TAKE 1/2 TABLET DAILY FOR SUPPRESSION  5  . Vitamin D, Ergocalciferol, 2000 units CAPS Take 2,000 Units by mouth daily.      No current facility-administered medications for this visit.     OBJECTIVE: Middle-aged white woman in no acute distress  Vitals:   07/24/18 1000  BP: 118/72  Pulse: 83  Resp: 18  Temp: 98.4 F (36.9 C)  SpO2: 99%     Body mass index is 35.72 kg/m.   Wt Readings from Last 3 Encounters:  07/24/18 223 lb (101.2 kg)  05/28/18 223 lb 3.2 oz (101.2 kg)  05/15/18 221 lb 3.2 oz (100.3 kg)      ECOG FS:1 - Symptomatic but completely ambulatory  Sclerae unicteric, EOMs intact Oropharynx clear and moist No cervical or supraclavicular  adenopathy Lungs no rales or rhonchi Heart regular rate and rhythm Abd soft, nontender, positive bowel sounds MSK no focal spinal tenderness, no upper extremity lymphedema Neuro: nonfocal, well oriented, appropriate affect Breasts: Right breast is status post lumpectomy and radiation.  The cosmetic result is excellent.  There is mild hyperpigmentation, no significant desquamation.  The left breast is benign.  Both axillae are benign.   LAB RESULTS:  CMP     Component Value Date/Time   NA 141 05/28/2018 1520   NA 140 05/02/2006 1027   K 4.0 05/28/2018 1520   K 4.0 05/02/2006 1027   CL 107 05/28/2018 1520   CL 104 05/02/2006 1027   CO2 26 05/28/2018 1520   CO2 22 05/02/2006 1027   GLUCOSE 108 (H) 05/28/2018 1520   GLUCOSE 99 05/02/2006 1027   BUN 15 05/28/2018 1520   BUN 12 05/02/2006 1027   CREATININE 1.11 (H) 05/28/2018 1520   CREATININE 0.9 05/02/2006 1027   CALCIUM 9.7 05/28/2018 1520   CALCIUM 9.6 05/02/2006 1027   PROT 7.7 05/28/2018 1520   ALBUMIN 4.5 05/28/2018 1520   AST 37 05/28/2018 1520   ALT 85 (H) 05/28/2018 1520   ALKPHOS 66 05/28/2018 1520   BILITOT 0.6 05/28/2018 1520   GFRNONAA 55 (L) 05/28/2018 1520   GFRAA >60 05/28/2018 1520    No results found for: TOTALPROTELP, ALBUMINELP, A1GS, A2GS, BETS, BETA2SER, GAMS, MSPIKE, SPEI  No results found for: KPAFRELGTCHN, LAMBDASER, KAPLAMBRATIO  Lab Results  Component Value Date   WBC 6.1 05/28/2018   NEUTROABS 3.3 05/28/2018   HGB 12.7 05/28/2018   HCT 37.5 05/28/2018   MCV 94.2 05/28/2018   PLT 228 05/28/2018    _0 @  No results found for: LABCA2  No components found for: EKBTCY818  No results for input(s): INR in the last 168 hours.  No results found for: LABCA2  No results found for: HTM931  No results found for: PET624  No results found for: ECX507  No results found for: CA2729  No components found for: HGQUANT  No results found for: CEA1 / No results found for:  CEA1   No results found for: AFPTUMOR  No results found for: CHROMOGRNA  No results found for: PSA1  No visits with results within 3 Day(s) from this visit.  Latest known visit with results is:  Appointment on 05/28/2018  Component Date Value Ref Range Status  . WBC  Count 05/28/2018 6.1  3.9 - 10.3 K/uL Final  . RBC 05/28/2018 3.98  3.70 - 5.45 MIL/uL Final  . Hemoglobin 05/28/2018 12.7  11.6 - 15.9 g/dL Final  . HCT 05/28/2018 37.5  34.8 - 46.6 % Final  . MCV 05/28/2018 94.2  79.5 - 101.0 fL Final  . MCH 05/28/2018 31.9  25.1 - 34.0 pg Final  . MCHC 05/28/2018 33.9  31.5 - 36.0 g/dL Final  . RDW 05/28/2018 12.3  11.2 - 14.5 % Final  . Platelet Count 05/28/2018 228  145 - 400 K/uL Final  . Neutrophils Relative % 05/28/2018 53  % Final  . Neutro Abs 05/28/2018 3.3  1.5 - 6.5 K/uL Final  . Lymphocytes Relative 05/28/2018 33  % Final  . Lymphs Abs 05/28/2018 2.0  0.9 - 3.3 K/uL Final  . Monocytes Relative 05/28/2018 8  % Final  . Monocytes Absolute 05/28/2018 0.5  0.1 - 0.9 K/uL Final  . Eosinophils Relative 05/28/2018 5  % Final  . Eosinophils Absolute 05/28/2018 0.3  0.0 - 0.5 K/uL Final  . Basophils Relative 05/28/2018 1  % Final  . Basophils Absolute 05/28/2018 0.0  0.0 - 0.1 K/uL Final   Performed at [facility], [place] [address], [place], [place] [address]  . Sodium 05/28/2018 141  135 - 145 mmol/L Final   Please note reference intervals were recently updated.  . Potassium 05/28/2018 4.0  3.5 - 5.1 mmol/L Final  . Chloride 05/28/2018 107  98 - 111 mmol/L Final  . CO2 05/28/2018 26  22 - 32 mmol/L Final  . Glucose, Bld 05/28/2018 108* 70 - 99 mg/dL Final  . BUN 05/28/2018 15  6 - 20 mg/dL Final   Please note change in reference range.  . Creatinine 05/28/2018 1.11* 0.44 - 1.00 mg/dL Final  . Calcium 05/28/2018 9.7  8.9 - 10.3 mg/dL Final  . Total Protein 05/28/2018 7.7  6.5 - 8.1 g/dL Final  . Albumin 05/28/2018 4.5  3.5 - 5.0 g/dL Final  . AST  05/28/2018 37  15 - 41 U/L Final  . ALT 05/28/2018 85* 0 - 44 U/L Final  . Alkaline Phosphatase 05/28/2018 66  38 - 126 U/L Final  . Total Bilirubin 05/28/2018 0.6  0.3 - 1.2 mg/dL Final  . GFR, Est Non Af Am 05/28/2018 55* >60 mL/min Final  . GFR, Est AFR Am 05/28/2018 >60  >60 mL/min Final   Comment: (NOTE) The eGFR has been calculated using the CKD EPI equation. This calculation has not been validated in all clinical situations. eGFR's persistently <60 mL/min signify possible Chronic Kidney Disease.   Georgiann Hahn gap 05/28/2018 8  5 - 15 Final   Performed at [facility], [place] [address], [place],  [address]    (this displays the last labs from the last 3 days)  No results found for: TOTALPROTELP, ALBUMINELP, A1GS, A2GS, BETS, BETA2SER, GAMS, MSPIKE, SPEI (this displays SPEP labs)  No results found for: KPAFRELGTCHN, LAMBDASER, KAPLAMBRATIO (kappa/lambda light chains)  No results found for: HGBA, HGBA2QUANT, HGBFQUANT, HGBSQUAN (Hemoglobinopathy evaluation)   No results found for: LDH  No results found for: IRON, TIBC, IRONPCTSAT (Iron and TIBC)  No results found for: FERRITIN  Urinalysis No results found for: COLORURINE, APPEARANCEUR, LABSPEC, PHURINE, GLUCOSEU, HGBUR, BILIRUBINUR, KETONESUR, PROTEINUR, UROBILINOGEN, NITRITE, LEUKOCYTESUR   STUDIES: No results found.  ELIGIBLE FOR AVAILABLE RESEARCH PROTOCOL: no  ASSESSMENT: 57 y.o. [place] woman status post right lumpectomy 04/30/2018 for extensive ductal  carcinoma in situ, grade 2, estrogen and progesterone receptor positive  (1) adjuvant radiation  06/11/2018 - 07/06/2018 Site/dose:   1. Right breast, 2.67 Gy in 15 fractions for a total dose of 40.05 Gy                      2. Right breast boost, 2 Gy in 5 fractions for a total dose of 10 Gy  (2) anastrozole started 07/24/2018  (a) bone density June 2017 was normal  PLAN: Shefali tolerated radiation well and is now ready to start  antiestrogens.  We again reviewed the difference between tamoxifen and anastrozole.  We consider taking tamoxifen with an aspirin daily given her history of factor V Leiden and also given the fact that she did take oral contraceptives for many years with no clotting complications.  Nevertheless the better part of valor is discretion and we are going with anastrozole.  She has a good understanding of the possible toxicities side effects and complications of this agent and is eager to get started.  I have gone ahead and placed the prescriptions in for her.  She had a normal bone density in June 2017.  This will be repeated when she gets her next set of mammograms in April.  I am going to see her in December just to make sure she is tolerating anastrozole well  She knows to call for any other issues that may develop before the next visit here.    Magrinat, Virgie Dad, MD  07/24/18 10:04 AM Medical Oncology and Hematology [facility] [address] [place], [place] [address] Tel. [phone]    Fax. [phone]  Alice Rieger, am acting as scribe for Chauncey Cruel MD.  I, Lurline Del MD, have reviewed the above documentation for accuracy and completeness, and I agree with the above.

## 2018-07-20 ENCOUNTER — Encounter: Payer: Self-pay | Admitting: Physical Therapy

## 2018-07-20 ENCOUNTER — Ambulatory Visit: Payer: 59 | Admitting: Physical Therapy

## 2018-07-20 DIAGNOSIS — M6281 Muscle weakness (generalized): Secondary | ICD-10-CM

## 2018-07-20 DIAGNOSIS — R252 Cramp and spasm: Secondary | ICD-10-CM

## 2018-07-20 DIAGNOSIS — Z170 Estrogen receptor positive status [ER+]: Secondary | ICD-10-CM

## 2018-07-20 DIAGNOSIS — C50911 Malignant neoplasm of unspecified site of right female breast: Secondary | ICD-10-CM

## 2018-07-20 DIAGNOSIS — R279 Unspecified lack of coordination: Secondary | ICD-10-CM
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
## 2018-07-21 DIAGNOSIS — G4733 Obstructive sleep apnea (adult) (pediatric): Secondary | ICD-10-CM | POA: Diagnosis not present

## 2018-07-24 ENCOUNTER — Telehealth: Payer: Self-pay | Admitting: Oncology

## 2018-07-24 ENCOUNTER — Inpatient Hospital Stay: Payer: 59 | Attending: Oncology | Admitting: Oncology

## 2018-07-24 VITALS — BP 118/72 | HR 83 | Temp 98.4°F | Resp 18 | Ht 66.25 in | Wt 223.0 lb

## 2018-07-24 DIAGNOSIS — Z170 Estrogen receptor positive status [ER+]: Secondary | ICD-10-CM | POA: Diagnosis not present

## 2018-07-24 DIAGNOSIS — D0511 Intraductal carcinoma in situ of right breast: Secondary | ICD-10-CM | POA: Insufficient documentation

## 2018-07-24 DIAGNOSIS — Z79811 Long term (current) use of aromatase inhibitors: Secondary | ICD-10-CM

## 2018-07-24 DIAGNOSIS — Z923 Personal history of irradiation: Secondary | ICD-10-CM | POA: Diagnosis not present

## 2018-07-24 DIAGNOSIS — D6851 Activated protein C resistance: Secondary | ICD-10-CM | POA: Insufficient documentation

## 2018-07-24 MED ORDER — TAMOXIFEN CITRATE 20 MG PO TABS: 20.0000 mg | ORAL_TABLET | Freq: Every day | ORAL | 12 refills | Status: DC

## 2018-07-24 MED ORDER — ANASTROZOLE 1 MG PO TABS: 1.0000 mg | ORAL_TABLET | Freq: Every day | ORAL | 1 refills | Status: DC

## 2018-07-24 MED ORDER — ANASTROZOLE 1 MG PO TABS: 1.0000 mg | ORAL_TABLET | Freq: Every day | ORAL | 4 refills | Status: DC

## 2018-07-24 MED ORDER — TAMOXIFEN CITRATE 20 MG PO TABS: 20.0000 mg | ORAL_TABLET | Freq: Every day | ORAL | 1 refills | Status: DC

## 2018-07-24 NOTE — Telephone Encounter (Signed)
Gave patient avs and calendar.  Bone density and mammo orders in for [facility].

## 2018-07-25 ENCOUNTER — Other Ambulatory Visit: Payer: Self-pay | Admitting: *Deleted

## 2018-07-26 ENCOUNTER — Encounter: Payer: Self-pay | Admitting: Radiation Oncology

## 2018-07-26 NOTE — Progress Notes (Signed)
Yvonne Wyatt presents for follow up of radiation completed 07/06/18 to her Right Breast. She last saw Dr. Jana Hakim on 07/24/18 and started Anastrozole. She will see Dr. Jana Hakim next on 10/30/18.  Pt states that she has sharp shooting pain that comes and goes. Pt states mild fatigue and naps occasional. Pt states occasional itching and states that the skin around her nipple is still peeling. Pt states that she is using the radiaplex gel and hydrocortisone cream.  BP 131/78   Pulse 68   Temp 98.6 F (37 C) (Oral)   Wt 225 lb 12.8 oz (102.4 kg)   SpO2 98%   BMI 36.17 kg/m   Wt Readings from Last 3 Encounters:  08/03/18 225 lb 12.8 oz (102.4 kg)  07/24/18 223 lb (101.2 kg)  05/28/18 223 lb 3.2 oz (101.2 kg)

## 2018-07-31 ENCOUNTER — Ambulatory Visit: Payer: 59 | Admitting: Physical Therapy

## 2018-08-02 DIAGNOSIS — L820 Inflamed seborrheic keratosis: Secondary | ICD-10-CM | POA: Diagnosis not present

## 2018-08-02 DIAGNOSIS — L851 Acquired keratosis [keratoderma] palmaris et plantaris: Secondary | ICD-10-CM | POA: Diagnosis not present

## 2018-08-02 DIAGNOSIS — D224 Melanocytic nevi of scalp and neck: Secondary | ICD-10-CM | POA: Diagnosis not present

## 2018-08-03 ENCOUNTER — Ambulatory Visit
Admission: RE | Admit: 2018-08-03 | Discharge: 2018-08-03 | Disposition: A | Discharge status: Home / Self Care | Payer: 59 | Source: Ambulatory Visit | Attending: Radiation Oncology | Admitting: Radiation Oncology

## 2018-08-03 ENCOUNTER — Encounter: Payer: Self-pay | Admitting: Radiation Oncology

## 2018-08-03 ENCOUNTER — Other Ambulatory Visit: Payer: Self-pay

## 2018-08-03 VITALS — BP 131/78 | HR 68 | Temp 98.6°F | Wt 225.8 lb

## 2018-08-03 DIAGNOSIS — Z79899 Other long term (current) drug therapy: Secondary | ICD-10-CM | POA: Diagnosis not present

## 2018-08-03 DIAGNOSIS — Z170 Estrogen receptor positive status [ER+]: Secondary | ICD-10-CM | POA: Diagnosis not present

## 2018-08-03 DIAGNOSIS — Z7982 Long term (current) use of aspirin: Secondary | ICD-10-CM | POA: Diagnosis not present

## 2018-08-03 DIAGNOSIS — Z923 Personal history of irradiation: Secondary | ICD-10-CM | POA: Diagnosis not present

## 2018-08-03 DIAGNOSIS — Z79811 Long term (current) use of aromatase inhibitors: Secondary | ICD-10-CM | POA: Insufficient documentation

## 2018-08-03 DIAGNOSIS — D0511 Intraductal carcinoma in situ of right breast: Secondary | ICD-10-CM

## 2018-08-03 HISTORY — DX: Personal history of irradiation: Z92.3

## 2018-08-03 NOTE — Progress Notes (Signed)
Radiation Oncology         [phone] ________________________________  Name: Yvonne Wyatt MRN: [id number]  Date: 08/03/2018  DOB: [birth year]  Follow-Up Visit Note  Outpatient  CC: Marton Redwood, MD  Rolm Bookbinder, MD  Diagnosis and Prior Radiotherapy:    ICD-10-CM   1. Ductal carcinoma in situ (DCIS) of right breast D05.11    Ductal carcinoma in situ (DCIS) of right breast Staging form: Breast, AJCC 8th Edition - Clinical: No stage assigned - Unsigned - Pathologic: Stage 0 (pTis (DCIS), pN0, cM0, ER+, PR+, HER2: Not Assessed) - Signed by Eppie Gibson, MD on 05/15/2018  06/11/2018 to 07/06/2018: 1. Right breast, 2.67 Gy x 15 fractions for a total dose of 40.05 Gy 2. Right breast boost, 2 Gy x 5 fractions for a total dose of 10 Gy  CHIEF COMPLAINT: Here for follow-up and surveillance of right breast DCIS  Narrative:  The patient returns today for routine follow-up of radiation completed 07/06/2018 to her right breast. She saw Dr. Jana Hakim on 07/24/2018 and will begin Anastrazole tonight. She will see Dr. Gwenlyn Perking next on 10/30/2018.    Patient reports sharp shooting pain that comes and goes. Reports mild fatigue and naps occasionally. Reports occasional itching and states that the skin around her nipple is still peeling. She states that she is using radiaplex gel and hydrocortisone cream.                               ALLERGIES:  is allergic to codeine.  Meds: Current Outpatient Medications  Medication Sig Dispense Refill  . Acetaminophen (TYLENOL EXTRA STRENGTH PO) Take 1,000 mg by mouth daily as needed (headache/pain).     [name] anastrozole (ARIMIDEX) 1 MG tablet Take 1 tablet (1 mg total) by mouth daily. 90 tablet 4  . anastrozole (ARIMIDEX) 1 MG tablet Take 1 tablet (1 mg total) by mouth daily. 30 tablet 1  . aspirin EC 81 MG tablet Take 81 mg by mouth daily.    . Calcium Citrate (CITRACAL PO) Take 2 tablets by mouth daily.     [name] escitalopram (LEXAPRO) 10 MG tablet  Take 10 mg by mouth at bedtime.   6  . FOLIC ACID PO Take 1,245 mcg by mouth daily.     . Hydrocortisone (PROCTOSOL HC RE) Place 1 application rectally 2 (two) times daily as needed (hemmorrhoids).    [name] levothyroxine (SYNTHROID, LEVOTHROID) 50 MCG tablet Take 50 mcg by mouth daily before breakfast.    . lisinopril (PRINIVIL,ZESTRIL) 20 MG tablet Take 20 mg by mouth daily.  11  . Lysine 500 MG TABS Take 500 mg by mouth daily.     . Melatonin 3 MG CAPS Take 3 mg by mouth at bedtime.     . Multiple Vitamin (MULTIVITAMIN WITH MINERALS) TABS tablet Take 1 tablet by mouth daily.    [name] nystatin-triamcinolone ointment (MYCOLOG) Apply 1 application topically 2 (two) times daily as needed.    . pantoprazole (PROTONIX) 40 MG tablet Take 40 mg by mouth daily.    . Probiotic Product (ALIGN PO) Take 1 tablet by mouth daily.     . ranitidine (ZANTAC) 150 MG tablet Take 150 mg by mouth 2 (two) times daily.    . valACYclovir (VALTREX) 1000 MG tablet TAKE 1/2 TABLET DAILY FOR SUPPRESSION  5  . Vitamin D, Ergocalciferol, 2000 units CAPS Take 2,000 Units by mouth daily.      No current  facility-administered medications for this encounter.     Physical Findings: The patient is in no acute distress. Patient is alert and oriented. She is accompanied by her husband today.  weight is 225 lb 12.8 oz (102.4 kg). Her oral temperature is 98.6 F (37 C). Her blood pressure is 131/78 and her pulse is 68. Her oxygen saturation is 98%. .    Satisfactory skin healing in radiotherapy fields. Mild erythema over the right breast and some superficial dry peeling over the right nipple.    Lab Findings: Lab Results  Component Value Date   WBC 6.1 05/28/2018   HGB 12.7 05/28/2018   HCT 37.5 05/28/2018   MCV 94.2 05/28/2018   PLT 228 05/28/2018    Radiographic Findings: No results found.  Impression/Plan: Healing well from radiotherapy to the breast tissue.  Continue skin care with topical Vitamin E Oil and / or lotion  for at least 2 more months for further healing.  I encouraged her to continue with yearly mammography as appropriate (for intact breast tissue) and followup with medical oncology. I will see her back on an as-needed basis. I have encouraged her to call if she has any issues or concerns in the future. I wished her the very best.   [facility] flyer provided today. _____________________________________   Eppie Gibson, MD   This document serves as a record of services personally performed by Eppie Gibson, MD. It was created on her behalf by Arlyce Harman, a trained medical scribe. The creation of this record is based on the scribe's personal observations and the provider's statements to them. This document has been checked and approved by the attending provider.

## 2018-08-07 DIAGNOSIS — Z0000 Encounter for general adult medical examination without abnormal findings: Secondary | ICD-10-CM | POA: Diagnosis not present

## 2018-08-07 DIAGNOSIS — R82998 Other abnormal findings in urine: Secondary | ICD-10-CM | POA: Diagnosis not present

## 2018-08-14 DIAGNOSIS — I10 Essential (primary) hypertension: Secondary | ICD-10-CM | POA: Diagnosis not present

## 2018-08-14 DIAGNOSIS — Z0000 Encounter for general adult medical examination without abnormal findings: Secondary | ICD-10-CM | POA: Diagnosis not present

## 2018-08-14 DIAGNOSIS — L658 Other specified nonscarring hair loss: Secondary | ICD-10-CM | POA: Diagnosis not present

## 2018-08-14 DIAGNOSIS — E7849 Other hyperlipidemia: Secondary | ICD-10-CM | POA: Diagnosis not present

## 2018-08-14 DIAGNOSIS — Z1389 Encounter for screening for other disorder: Secondary | ICD-10-CM | POA: Diagnosis not present

## 2018-08-14 DIAGNOSIS — L659 Nonscarring hair loss, unspecified: Secondary | ICD-10-CM | POA: Diagnosis not present

## 2018-08-14 DIAGNOSIS — R7301 Impaired fasting glucose: Secondary | ICD-10-CM | POA: Diagnosis not present

## 2018-08-15 ENCOUNTER — Other Ambulatory Visit: Payer: Self-pay | Admitting: Oncology

## 2018-08-16 ENCOUNTER — Ambulatory Visit: Payer: 59 | Admitting: Physical Therapy

## 2018-08-16 DIAGNOSIS — Z1212 Encounter for screening for malignant neoplasm of rectum: Secondary | ICD-10-CM | POA: Diagnosis not present

## 2018-08-17 ENCOUNTER — Other Ambulatory Visit: Payer: Self-pay | Admitting: Internal Medicine

## 2018-08-17 DIAGNOSIS — R1013 Epigastric pain: Secondary | ICD-10-CM | POA: Diagnosis not present

## 2018-08-17 DIAGNOSIS — E785 Hyperlipidemia, unspecified: Secondary | ICD-10-CM

## 2018-08-17 DIAGNOSIS — K5909 Other constipation: Secondary | ICD-10-CM | POA: Diagnosis not present

## 2018-08-18 DIAGNOSIS — S0990XA Unspecified injury of head, initial encounter: Secondary | ICD-10-CM | POA: Diagnosis not present

## 2018-08-18 DIAGNOSIS — R55 Syncope and collapse: Secondary | ICD-10-CM | POA: Diagnosis not present

## 2018-08-18 DIAGNOSIS — S82851D Displaced trimalleolar fracture of right lower leg, subsequent encounter for closed fracture with routine healing: Secondary | ICD-10-CM | POA: Diagnosis not present

## 2018-08-18 DIAGNOSIS — S82851A Displaced trimalleolar fracture of right lower leg, initial encounter for closed fracture: Secondary | ICD-10-CM | POA: Diagnosis not present

## 2018-08-18 DIAGNOSIS — R51 Headache: Secondary | ICD-10-CM | POA: Diagnosis not present

## 2018-08-19 ENCOUNTER — Other Ambulatory Visit: Payer: Self-pay

## 2018-08-19 ENCOUNTER — Encounter (HOSPITAL_COMMUNITY): Payer: Self-pay

## 2018-08-19 ENCOUNTER — Emergency Department (HOSPITAL_COMMUNITY)
Admission: EM | Admit: 2018-08-19 | Discharge: 2018-08-19 | Disposition: A | Discharge status: Home / Self Care | Payer: 59 | Attending: Emergency Medicine | Admitting: Emergency Medicine

## 2018-08-19 DIAGNOSIS — Z7982 Long term (current) use of aspirin: Secondary | ICD-10-CM | POA: Diagnosis not present

## 2018-08-19 DIAGNOSIS — Z79899 Other long term (current) drug therapy: Secondary | ICD-10-CM | POA: Diagnosis not present

## 2018-08-19 DIAGNOSIS — Y939 Activity, unspecified: Secondary | ICD-10-CM | POA: Diagnosis not present

## 2018-08-19 DIAGNOSIS — Y929 Unspecified place or not applicable: Secondary | ICD-10-CM | POA: Diagnosis not present

## 2018-08-19 DIAGNOSIS — Y33XXXA Other specified events, undetermined intent, initial encounter: Secondary | ICD-10-CM | POA: Diagnosis not present

## 2018-08-19 DIAGNOSIS — I10 Essential (primary) hypertension: Secondary | ICD-10-CM | POA: Insufficient documentation

## 2018-08-19 DIAGNOSIS — E039 Hypothyroidism, unspecified: Secondary | ICD-10-CM | POA: Insufficient documentation

## 2018-08-19 DIAGNOSIS — S82854A Nondisplaced trimalleolar fracture of right lower leg, initial encounter for closed fracture: Secondary | ICD-10-CM | POA: Insufficient documentation

## 2018-08-19 DIAGNOSIS — Y998 Other external cause status: Secondary | ICD-10-CM | POA: Insufficient documentation

## 2018-08-19 DIAGNOSIS — S82851A Displaced trimalleolar fracture of right lower leg, initial encounter for closed fracture: Secondary | ICD-10-CM

## 2018-08-19 MED ORDER — ONDANSETRON HCL 4 MG/2ML IJ SOLN
4.0000 mg | Freq: Once | INTRAMUSCULAR | Status: AC
  Administered 2018-08-19: 4 mg via INTRAVENOUS
  Filled 2018-08-19: qty 2

## 2018-08-19 MED ORDER — MORPHINE SULFATE 2 MG/ML IJ SOLN
2.00 | INTRAMUSCULAR | Status: DC
Start: ? — End: 2018-08-19

## 2018-08-19 MED ORDER — FENTANYL CITRATE (PF) 100 MCG/2ML IJ SOLN
100.0000 ug | Freq: Once | INTRAMUSCULAR | Status: AC
Start: 2018-08-19 — End: 2018-08-19
  Administered 2018-08-19: 100 ug via INTRAMUSCULAR
  Filled 2018-08-19: qty 2

## 2018-08-19 NOTE — ED Triage Notes (Signed)
Pt is alert and oriented x 4 and is verbally responsive. Pt reports that she fell yesterday and fx her right  Ankle and was placed in a caste . Pt reports that since last night she has had increasing pain in her foot 10/10 throbbing and reports that she has numbness to pink toe. Pt PMS.  No discoloration is noted Cap refill < 3 secs. Pt reports that she took a Norco for pain, and reports that it was short term effective. Pt states that she feels as if the cast is too tight.

## 2018-08-19 NOTE — Discharge Instructions (Signed)
Take pain medications as directed.  Follow-up with [facility] for further evaluation.  Call their office to arrange for an appointment.  Keep the leg elevated to help with soft tissue swelling.  Return to the emergency department for any worsening pain, swelling, redness, numbness to the foot, discoloration of the foot or toes or any other worsening or concerning symptoms.

## 2018-08-19 NOTE — ED Provider Notes (Addendum)
Medical screening examination/treatment/procedure(s) were conducted as a shared visit with non-physician practitioner(s) and myself.  I personally evaluated the patient during the encounter.  None Patient had a ankle fracture yesterday.  She was splinted and discharged to home.  She reports that she was having increasing pain and pressure on the foot with numbness developing in her small toe.  Splint has been removed and opened.  Patient has soft and nontender calf.  Skin condition is good.  Salas pedis pulse 2+.  Patient has brisk cap refill to toes.  There is some visible indentation of the soft tissue of the sole of the foot just adjacent to the fifth digit where the splint is putting some pressure.  There is no tissue breakdown.  This is however clearly an area where there is compression.  Will be resplinted and plan to follow-up with orthopedics.  Marland KitchenSplint Application Date/Time: 55/12/800 7:37 AM Performed by: Charlesetta Shanks, MD Authorized by: Charlesetta Shanks, MD   Consent:    Consent obtained:  Verbal   Consent given by:  Patient   Risks discussed:  Discoloration, numbness, pain and swelling Pre-procedure details:    Sensation:  Normal Procedure details:    Laterality:  Right   Location:  Leg   Leg:  R lower leg   Splint type:  Short leg and sugar tong Post-procedure details:    Pain:  Improved   Sensation:  Normal   Skin color:  Pink with brisk cap refill   Patient tolerance of procedure:  Tolerated well, no immediate complications Comments:     A glass splint placed in conjunction with orthopedic tech.  Extensive  padding beneath the splint.   Charlesetta Shanks, MD 08/19/18 1455    Charlesetta Shanks, MD 08/29/18 [phone]

## 2018-08-19 NOTE — ED Notes (Signed)
Bed: WA03 Expected date:  Expected time:  Means of arrival:  Comments: Hold for [employer]

## 2018-08-19 NOTE — ED Provider Notes (Signed)
[place] DEPT Provider Note   CSN: [id number] Arrival date & time: 08/19/18  1314     History   Chief Complaint Chief Complaint  Patient presents with  . Ankle Pain    HPI Yvonne Wyatt is a 57 y.o. female past medical history of fatigue, GERD, recent trimalleolar ankle fracture that occurred on 08/18/18 who presents for evaluation of right foot pain and numbness to the right fifth digit.  Patient states that she sustained a trimalleolar fracture of her right ankle yesterday.  She was seen at [facility] ED last night for evaluation of symptoms.  At that time, she had a splint placed and was told to follow-up with her orthopedic doctor.  Patient reports that when she got home, she started having worsening pain to the midfoot.  Additionally, she started having some numbness to the right fifth toe.  She states that she has been taking her pain medication but is not been helping.  She additionally has reported some nausea secondary to pain.  She describes it as a constant 10/10 throbbing pain.  She feels like the splint is too tight.  She has not had any new trauma, injury, fall.  She has not noticed any discoloration of the toes.  Patient denies any pain to the upper leg or calf.  Patient denies any fevers, chest pain, difficulty breathing.  The history is provided by the patient.    Past Medical History:  Diagnosis Date  . Complication of anesthesia   . Dyspareunia 05/20/2014   Early menopause age 34  . Fatigue   . GERD (gastroesophageal reflux disease)   . Headache   . Heterozygous factor V Leiden mutation ([place]) 05/20/2014   Dx 2007  No personal hx of thrombosis;  mother DVT age 66 on OC  . History of hiatal hernia   . History of radiation therapy 06/11/18- 07/06/18   Right breast- 15 fractions for a total dose of 40.05 Gy, Right breast boost- 5 fractions for a total dose of 10 Gy  . Hypertension   . Hypothyroidism   . Osteopenia   . PONV (postoperative  nausea and vomiting)   . Sleep apnea     Patient Active Problem List   Diagnosis Date Noted  . Ductal carcinoma in situ (DCIS) of right breast 05/15/2018  . Obstructive sleep apnea treated with continuous positive airway pressure (CPAP) 03/06/2018  . Heterozygous factor V Leiden mutation ([place]) 05/20/2014  . Dyspareunia 05/20/2014    Past Surgical History:  Procedure Laterality Date  . CESAREAN SECTION    . DIAGNOSTIC LAPAROSCOPY     for endometriosis  . GANGLION CYST EXCISION Right    right wrist  . INGUINAL HERNIA REPAIR     x 2 , at 18 months and 57 years old.   [name] RADIOACTIVE SEED GUIDED EXCISIONAL BREAST BIOPSY Right 04/30/2018   Procedure: RIGHT RADIOACTIVE SEED GUIDED EXCISIONAL BREAST BIOPSY ERAS PATHWAY;  Surgeon: Rolm Bookbinder, MD;  Location: [facility];  Service: General;  Laterality: Right;  . SINUS EXPLORATION  1990     OB History   None      Home Medications    Prior to Admission medications   Medication Sig Start Date End Date Taking? Authorizing Provider  Acetaminophen (TYLENOL EXTRA STRENGTH PO) Take 1,000 mg by mouth daily as needed (headache/pain).     [provider]  anastrozole (ARIMIDEX) 1 MG tablet Take 1 tablet (1 mg total) by mouth daily. 07/24/18   Magrinat,  Virgie Dad, MD  anastrozole (ARIMIDEX) 1 MG tablet TAKE 1 TABLET BY MOUTH EVERY DAY 08/15/18   Magrinat, Virgie Dad, MD  aspirin EC 81 MG tablet Take 81 mg by mouth daily.    [provider]  Calcium Citrate (CITRACAL PO) Take 2 tablets by mouth daily.     [provider]  escitalopram (LEXAPRO) 10 MG tablet Take 10 mg by mouth at bedtime.  07/08/17   [provider]  FOLIC ACID PO Take 8,469 mcg by mouth daily.     [provider]  Hydrocortisone (PROCTOSOL HC RE) Place 1 application rectally 2 (two) times daily as needed (hemmorrhoids).    [provider]  levothyroxine (SYNTHROID, LEVOTHROID) 50 MCG tablet Take 50 mcg by mouth daily before  breakfast.    [provider]  lisinopril (PRINIVIL,ZESTRIL) 20 MG tablet Take 20 mg by mouth daily. 06/24/17   [provider]  Lysine 500 MG TABS Take 500 mg by mouth daily.     [provider]  Melatonin 3 MG CAPS Take 3 mg by mouth at bedtime.     [provider]  Multiple Vitamin (MULTIVITAMIN WITH MINERALS) TABS tablet Take 1 tablet by mouth daily.    [provider]  nystatin-triamcinolone ointment (MYCOLOG) Apply 1 application topically 2 (two) times daily as needed.    [provider]  pantoprazole (PROTONIX) 40 MG tablet Take 40 mg by mouth daily.    [provider]  Probiotic Product (ALIGN PO) Take 1 tablet by mouth daily.     [provider]  ranitidine (ZANTAC) 150 MG tablet Take 150 mg by mouth 2 (two) times daily.    [provider]  valACYclovir (VALTREX) 1000 MG tablet TAKE 1/2 TABLET DAILY FOR SUPPRESSION 07/08/17   [provider]  Vitamin D, Ergocalciferol, 2000 units CAPS Take 2,000 Units by mouth daily.     [provider]    Family History Family History  Problem Relation Age of Onset  . CAD Mother   . Deep vein thrombosis Mother   . Hyperlipidemia Mother   . COPD Mother   . AAA (abdominal aortic aneurysm) Mother   . Dementia Mother   . COPD Father   . CVA Brother   . Diabetes Brother   . Hyperlipidemia Brother     Social History Social History   Tobacco Use  . Smoking status: Never Smoker  . Smokeless tobacco: Never Used  Substance Use Topics  . Alcohol use: Yes    Comment: occasional  . Drug use: No     Allergies   Codeine   Review of Systems Review of Systems  Constitutional: Negative for fever.  Respiratory: Negative for shortness of breath.   Cardiovascular: Negative for chest pain.  Musculoskeletal:       Right foot pain  Skin: Negative for color change.  Neurological: Positive for numbness (right 5th toe). Negative for weakness.  All  other systems reviewed and are negative.    Physical Exam Updated Vital Signs BP 108/62 (BP Location: Right Arm)   Pulse 79   Temp 99.1 F (37.3 C) (Oral)   Resp 18   Ht 5\' 6"  (1.676 m)   SpO2 97%   BMI 36.45 kg/m   Physical Exam  Constitutional: She appears well-developed and well-nourished.  HENT:  Head: Normocephalic and atraumatic.  Eyes: Conjunctivae and EOM are normal. Right eye exhibits no discharge. Left eye exhibits no discharge. No scleral icterus.  Cardiovascular:  Pulses:  Dorsalis pedis pulses are 2+ on the right side, and 2+ on the left side.  Pulmonary/Chest: Effort normal.  Musculoskeletal:  Right lower extremity is with a posterior splint in place.  Patient can move all 5 toes of right digit without any difficulty.  Evaluation after splint removed.  The compartments are soft with no overlying edema, warmth.  No tenderness palpation noted to right calf.  Neurological: She is alert. No sensory deficit.  Decreased sensation noted to the lateral aspect of the right fifth digit.  Sensation restored after splint removed.  Skin: Skin is warm and dry. Capillary refill takes less than 2 seconds.  Good distal cap refill. RLE is not dusky in appearance or cool to touch.  Psychiatric: She has a normal mood and affect. Her speech is normal and behavior is normal.  Nursing note and vitals reviewed.    ED Treatments / Results  Labs (all labs ordered are listed, but only abnormal results are displayed) Labs Reviewed - No data to display  EKG None  Radiology No results found.  Procedures Procedures (including critical care time)  Medications Ordered in ED Medications  ondansetron (ZOFRAN) injection 4 mg (4 mg Intravenous Given 08/19/18 1406)  fentaNYL (SUBLIMAZE) injection 100 mcg (100 mcg Intramuscular Given 08/19/18 1406)     Initial Impression / Assessment and Plan / ED Course  I have reviewed the triage vital signs and the nursing notes.  Pertinent  labs & imaging results that were available during my care of the patient were reviewed by me and considered in my medical decision making (see chart for details).     57 year old female with past uncle history of GERD, hypertension who sustained a trimalleolar right ankle fracture on 08/17/18.  She was seen at [facility] yesterday and the splint was placed.  Reports worsening pain to splint.  Additionally has some decreased sensation in the lateral aspect of her right fifth digit.  She feels like the splint is too tight. Patient is afebrile, non-toxic appearing, sitting comfortably on examination table. Vital signs reviewed and stable.  Good cap refill.  Toes are not discolored and no evidence of decreased cap refill.  We will plan to take splint off and evaluate.  Low suspicion for compartment syndrome but a consideration.   Reevaluation after the splint was removed.  Patient with soft compartments.  No overlying warmth, erythema.  No tenderness palpation in the calf or midline.  Sensation improved after splint was removed.  On evaluation, she has a small area on the lateral aspect of the right fifth digit where looks like the splint was pressing into the skin.  Suspect that this was compressing and causing the sensation differences.  She reports improvement in symptoms after splint removed.  History/physical exam is not concerning for compartment syndrome.  Discussed with Dr. Vallery Ridge who intimately evaluated patient.  Since there is no new trauma, injury and she has not been weightbearing on the leg hand positioning looks good, no indication for repeat imaging.  Will place on splint with extra padding.  Reevaluation after splint placement.  Patient with good distal sensation cap refill.  Instructed patient on splint care and supportive at home therapies.  She is previously seen at [facility].  Instructed her to follow-up with them as soon as possible. Patient had ample opportunity for questions and discussion.  All patient's questions were answered with full understanding. Strict return precautions discussed. Patient expresses understanding and agreement to plan.    Final Clinical Impressions(s) /  ED Diagnoses   Final diagnoses:  Closed trimalleolar fracture of right ankle, initial encounter    ED Discharge Orders    None       Volanda Napoleon, PA-C 08/19/18 1624    Charlesetta Shanks, MD 08/29/18 0740

## 2018-08-20 DIAGNOSIS — G4733 Obstructive sleep apnea (adult) (pediatric): Secondary | ICD-10-CM | POA: Diagnosis not present

## 2018-08-21 ENCOUNTER — Other Ambulatory Visit: Payer: 59

## 2018-08-22 ENCOUNTER — Telehealth: Payer: Self-pay | Admitting: *Deleted

## 2018-08-22 DIAGNOSIS — M25571 Pain in right ankle and joints of right foot: Secondary | ICD-10-CM | POA: Diagnosis not present

## 2018-08-22 DIAGNOSIS — S82851A Displaced trimalleolar fracture of right lower leg, initial encounter for closed fracture: Secondary | ICD-10-CM | POA: Diagnosis not present

## 2018-08-22 NOTE — Telephone Encounter (Signed)
Upload medical records per pt request to [place] [facility]; release [id number]

## 2018-08-27 ENCOUNTER — Telehealth: Payer: Self-pay | Admitting: *Deleted

## 2018-08-27 NOTE — Telephone Encounter (Signed)
On 08-27-18 mail medical records to the patient

## 2018-08-28 DIAGNOSIS — G8918 Other acute postprocedural pain: Secondary | ICD-10-CM | POA: Diagnosis not present

## 2018-08-28 DIAGNOSIS — S82851A Displaced trimalleolar fracture of right lower leg, initial encounter for closed fracture: Secondary | ICD-10-CM | POA: Diagnosis not present

## 2018-08-29 ENCOUNTER — Encounter: Payer: 59 | Admitting: Physical Therapy

## 2018-09-05 ENCOUNTER — Ambulatory Visit: Payer: 59 | Admitting: Nurse Practitioner

## 2018-09-10 DIAGNOSIS — S82851D Displaced trimalleolar fracture of right lower leg, subsequent encounter for closed fracture with routine healing: Secondary | ICD-10-CM | POA: Diagnosis not present

## 2018-09-13 ENCOUNTER — Encounter: Payer: 59 | Admitting: Physical Therapy

## 2018-09-20 DIAGNOSIS — G4733 Obstructive sleep apnea (adult) (pediatric): Secondary | ICD-10-CM | POA: Diagnosis not present

## 2018-09-26 DIAGNOSIS — G4733 Obstructive sleep apnea (adult) (pediatric): Secondary | ICD-10-CM | POA: Diagnosis not present

## 2018-09-28 NOTE — Progress Notes (Signed)
[facility]  PATIENT: ABYGALE KARPF DOB: [birth year]   REASON FOR VISIT:  obstructive sleep apnea with  CPAP HISTORY FROM: Patient    HISTORY OF PRESENT ILLNESS:UPDATE 11/18/2019CM Ms. Boylan, 57 year old female returns for follow-up with obstructive sleep apnea here for CPAP compliance.  She has been doing well.  She recently fell off a retaining wall and had to have ankle surgery.  She is nonweightbearing on the right lower extremity with boot in place.  CPAP compliance dated 09/07/2018-10/06/2018 shows compliance greater than 4 hours at 100%.  Average usage 9 hours 32 minutes.  Set pressure 5 to 12 cm.  EPR level 3 leak 95th percentile at 3.  AHI 1.2 ESS 2 she returns for reevaluation    UPDATE 4/16/2019CM Ms. Macqueen, 57 year old female returns for follow-up with a history of newly diagnosed obstructive sleep apnea here for initial CPAP.  She is doing well with her machine.  She has had approximately 3 months.  She has less nocturia at night.  Daytime drowsiness has improved.  CPAP data dated 02/03/2018-03/04/2018 shows compliance greater than 4 hours at 100%.  Average usage 8 hours 3 minutes.  Set pressure 5-12 cm.  EPR level 3 AHI 1.3 leaks 95 percentile at  4.5.  ESS 5.  She returns for reevaluation. 10/3/18CDJanice ROBYNNE ROAT is a 57 y.o. female , seen here as in a referral  from Dr. Garwin Brothers for a re - evaluation for possible OSA.  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 She reports poor sleep waking up with headaches, feeling sleepy during the day and having trouble staying awake even when busy. In 2010 she was evaluated for the presence of sleep apnea , only mild obstructive sleep apnea was found at the time -she was placed on CPAP but after a year discontinued it. She had lost a significant amount of weight which she recently regained and relates the return of symptoms to this weight gain.  Her caretaker duties at home have  been more than before, too. She has a history of hypothyroidism, osteopenia, obesity, fatigue, postmenopausal sleep disorder, factor V Leiden, atypical chest pain, reflux disease abdominal aorticectasia endometriosis, kidney stones, sinus surgery in 1992.  Chief complaint according to patient : Sleepy, fatigued.   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 She endorses a bedtime around 10:30 PM, and is usually asleep promptly. Her bedroom is cool, quiet and dark. She shows a bedroom with her husband. She sleeps on one pillow but with an elevated head of bed to counteract gastric reflux. She describes her sleep is fragmented by spontaneous arousals she is not sure what wakes her- she has to go to the bathroom once or twice each night, too. She wakes by alarm at 5.50 AM.  Feels as she hadn't slept. Headaches present every morning, generalized achiness . Being drowsy    REVIEW OF SYSTEMS: Full 14 system review of systems performed and notable only for those listed, all others are neg:  Constitutional: neg  Cardiovascular: neg Ear/Nose/Throat: Runny nose Skin: neg Eyes: neg Respiratory: neg Gastroitestinal: neg  Hematology/Lymphatic: neg  Endocrine: neg Musculoskeletal: Joint pain, walking difficulty Allergy/Immunology: neg Neurological: Occasional headache  Psychiatric: neg Sleep : Obstructive sleep apnea with CPAP   ALLERGIES: Allergies  Allergen Reactions  . Codeine Hives and Rash  HOME MEDICATIONS: Outpatient Medications Prior to Visit  Medication Sig Dispense Refill  . Acetaminophen (TYLENOL EXTRA STRENGTH PO) Take 1,000 mg by mouth daily as needed (headache/pain).     [name] anastrozole (ARIMIDEX) 1 MG tablet Take 1 tablet (1 mg total) by mouth daily. 90 tablet 4  . aspirin EC 81 MG tablet Take 81 mg by mouth daily.    . Calcium Citrate (CITRACAL PO) Take 2 tablets  by mouth daily.     [name] desvenlafaxine (PRISTIQ) 50 MG 24 hr tablet Take 50 mg by mouth daily.    [name] docusate sodium (COLACE) 100 MG capsule Take 100 mg by mouth as needed for mild constipation.    [name] FOLIC ACID PO Take 1,761 mcg by mouth daily.     . Hydrocortisone (PROCTOSOL HC RE) Place 1 application rectally 2 (two) times daily as needed (hemmorrhoids).    [name] levothyroxine (SYNTHROID, LEVOTHROID) 50 MCG tablet Take 50 mcg by mouth daily before breakfast.    . lisinopril (PRINIVIL,ZESTRIL) 20 MG tablet Take 20 mg by mouth daily.  11  . Lysine 500 MG TABS Take 500 mg by mouth daily.     . Magnesium 250 MG TABS Take 250 mg by mouth at bedtime.    . Melatonin 3 MG CAPS Take 3 mg by mouth at bedtime.     . Multiple Vitamin (MULTIVITAMIN WITH MINERALS) TABS tablet Take 1 tablet by mouth daily.    [name] nystatin-triamcinolone ointment (MYCOLOG) Apply 1 application topically 2 (two) times daily as needed.    . Probiotic Product (ALIGN PO) Take 1 tablet by mouth daily.     . ranitidine (ZANTAC) 150 MG tablet Take 150 mg by mouth 2 (two) times daily.    . valACYclovir (VALTREX) 1000 MG tablet TAKE 1/2 TABLET DAILY FOR SUPPRESSION  5  . Vitamin D, Ergocalciferol, 2000 units CAPS Take 2,000 Units by mouth daily.     [name] escitalopram (LEXAPRO) 10 MG tablet Take 10 mg by mouth at bedtime.   6  . pantoprazole (PROTONIX) 40 MG tablet Take 40 mg by mouth daily.    [name] anastrozole (ARIMIDEX) 1 MG tablet TAKE 1 TABLET BY MOUTH EVERY DAY 30 tablet 1   No facility-administered medications prior to visit.     PAST MEDICAL HISTORY: Past Medical History:  Diagnosis Date  . Complication of anesthesia   . Dyspareunia 05/20/2014   Early menopause age 53  . Fatigue   . GERD (gastroesophageal reflux disease)   . Headache   . Heterozygous factor V Leiden mutation ([place]) 05/20/2014   Dx 2007  No personal hx of thrombosis;  mother DVT age 52 on OC  . History of hiatal hernia   . History of radiation therapy 06/11/18- 07/06/18     Right breast- 15 fractions for a total dose of 40.05 Gy, Right breast boost- 5 fractions for a total dose of 10 Gy  . Hypertension   . Hypothyroidism   . Osteopenia   . PONV (postoperative nausea and vomiting)   . Sleep apnea     PAST SURGICAL HISTORY: Past Surgical History:  Procedure Laterality Date  . CESAREAN SECTION    . DIAGNOSTIC LAPAROSCOPY     for endometriosis  . GANGLION CYST EXCISION Right    right wrist  . INGUINAL HERNIA REPAIR     x 2 , at 18 months and 57 years old.   [name] RADIOACTIVE SEED GUIDED EXCISIONAL BREAST BIOPSY Right 04/30/2018   Procedure: RIGHT RADIOACTIVE SEED GUIDED  EXCISIONAL BREAST BIOPSY ERAS PATHWAY;  Surgeon: Rolm Bookbinder, MD;  Location: [facility];  Service: General;  Laterality: Right;  . SINUS EXPLORATION  1990    FAMILY HISTORY: Family History  Problem Relation Age of Onset  . CAD Mother   . Deep vein thrombosis Mother   . Hyperlipidemia Mother   . COPD Mother   . AAA (abdominal aortic aneurysm) Mother   . Dementia Mother   . COPD Father   . CVA Brother   . Diabetes Brother   . Hyperlipidemia Brother     SOCIAL HISTORY: Social History   Socioeconomic History  . Marital status: Married    Spouse name: Not on file  . Number of children: Not on file  . Years of education: Not on file  . Highest education level: Not on file  Occupational History  . Not on file  Social Needs  . Financial resource strain: Not on file  . Food insecurity:    Worry: Not on file    Inability: Not on file  . Transportation needs:    Medical: Not on file    Non-medical: Not on file  Tobacco Use  . Smoking status: Never Smoker  . Smokeless tobacco: Never Used  Substance and Sexual Activity  . Alcohol use: Yes    Comment: occasional  . Drug use: No  . Sexual activity: Not on file  Lifestyle  . Physical activity:    Days per week: Not on file    Minutes per session: Not on file  . Stress: Not on file  Relationships  . Social connections:     Talks on phone: Not on file    Gets together: Not on file    Attends religious service: Not on file    Active member of club or organization: Not on file    Attends meetings of clubs or organizations: Not on file    Relationship status: Not on file  . Intimate partner violence:    Fear of current or ex partner: Not on file    Emotionally abused: Not on file    Physically abused: Not on file    Forced sexual activity: Not on file  Other Topics Concern  . Not on file  Social History Narrative  . Not on file     PHYSICAL EXAM  Vitals:   10/08/18 1337  BP: 121/75  Pulse: 95  Weight: 222 lb 6.4 oz (100.9 kg)  Height: 5\' 6"  (1.676 m)   Body mass index is 35.9 kg/m.  Generalized: Well developed, in no acute distress  Head: normocephalic and atraumatic,. Oropharynx benign malopatti 1-2 Neck: Supple, circumference 15.75 Musculoskeletal: No deformity   Neurological examination   Mentation: Alert oriented to time, place, history taking. Attention span and concentration appropriate. Recent and remote memory intact.  Follows all commands speech and language fluent.   Cranial nerve II-XII: Pupils were equal round reactive to light extraocular movements were full, visual field were full on confrontational test. Facial sensation and strength were normal. hearing was intact to finger rubbing bilaterally. Uvula tongue midline. head turning and shoulder shrug were normal and symmetric.Tongue protrusion into cheek strength was normal. Motor: normal bulk and tone, full strength in the BUE, BLE, Sensory: normal and symmetric to light touch, Coordination: finger-nose-finger, heel-to-shin bilaterally, no dysmetria Gait and Station: Not ambulated nonweightbearing on right lower leg due to fracture  DIAGNOSTIC DATA (LABS, IMAGING, TESTING) - I reviewed patient records, labs, notes, testing and imaging myself where available.  Lab Results  Component Value Date   WBC 6.1 05/28/2018   HGB 12.7  05/28/2018   HCT 37.5 05/28/2018   MCV 94.2 05/28/2018   PLT 228 05/28/2018      Component Value Date/Time   NA 141 05/28/2018 1520   NA 140 05/02/2006 1027   K 4.0 05/28/2018 1520   K 4.0 05/02/2006 1027   CL 107 05/28/2018 1520   CL 104 05/02/2006 1027   CO2 26 05/28/2018 1520   CO2 22 05/02/2006 1027   GLUCOSE 108 (H) 05/28/2018 1520   GLUCOSE 99 05/02/2006 1027   BUN 15 05/28/2018 1520   BUN 12 05/02/2006 1027   CREATININE 1.11 (H) 05/28/2018 1520   CREATININE 0.9 05/02/2006 1027   CALCIUM 9.7 05/28/2018 1520   CALCIUM 9.6 05/02/2006 1027   PROT 7.7 05/28/2018 1520   ALBUMIN 4.5 05/28/2018 1520   AST 37 05/28/2018 1520   ALT 85 (H) 05/28/2018 1520   ALKPHOS 66 05/28/2018 1520   BILITOT 0.6 05/28/2018 1520   GFRNONAA 55 (L) 05/28/2018 1520   GFRAA >60 05/28/2018 1520    ASSESSMENT AND PLAN  56 y.o. year old female  has a past medical history of Dyspareunia (05/20/2014), Fatigue, GERD (gastroesophageal reflux disease), Heterozygous factor V Leiden mutation ([place]) (05/20/2014), Osteopenia, and Sleep apnea.  here for  CPAP compliance.CPAP compliance dated 09/07/2018-10/06/2018 shows compliance greater than 4 hours at 100%.  Average usage 9 hours 32 minutes.  Set pressure 5 to 12 cm.  EPR level 3 leak 95th percentile at 3.  AHI 1.2 ESS 2   PLAN: CPAP compliance 100%  Continue same settings Follow-up yearly  for repeat compliance Dennie Bible, [facility], [facility], APRN  [facility] [address], [place], [place] [address] [phone]

## 2018-10-06 ENCOUNTER — Encounter: Payer: Self-pay | Admitting: Nurse Practitioner

## 2018-10-08 ENCOUNTER — Encounter: Payer: Self-pay | Admitting: Nurse Practitioner

## 2018-10-08 ENCOUNTER — Ambulatory Visit: Payer: 59 | Admitting: Nurse Practitioner

## 2018-10-08 VITALS — BP 121/75 | HR 95 | Ht 66.0 in | Wt 222.4 lb

## 2018-10-08 DIAGNOSIS — G4733 Obstructive sleep apnea (adult) (pediatric): Secondary | ICD-10-CM

## 2018-10-08 DIAGNOSIS — Z9989 Dependence on other enabling machines and devices: Secondary | ICD-10-CM | POA: Diagnosis not present

## 2018-10-08 DIAGNOSIS — S82851D Displaced trimalleolar fracture of right lower leg, subsequent encounter for closed fracture with routine healing: Secondary | ICD-10-CM | POA: Diagnosis not present

## 2018-10-08 NOTE — Patient Instructions (Signed)
CPAP compliance 100%  Continue same settings Follow-up yearly  for repeat compliance

## 2018-10-25 ENCOUNTER — Other Ambulatory Visit: Payer: 59

## 2018-10-26 ENCOUNTER — Ambulatory Visit
Admission: RE | Admit: 2018-10-26 | Discharge: 2018-10-26 | Disposition: A | Discharge status: Home / Self Care | Payer: 59 | Source: Ambulatory Visit | Attending: Internal Medicine | Admitting: Internal Medicine

## 2018-10-26 DIAGNOSIS — E785 Hyperlipidemia, unspecified: Secondary | ICD-10-CM

## 2018-10-29 NOTE — Progress Notes (Signed)
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 She underwent bilateral diagnostic mammography with tomography and bilateral breast ultrasonography at [facility] on 03/19/2018 showing: breast density category B,  Persistent distortion in the Upper-Outer Quadrant of the Right breast without sonographic correlate. No axillary adenopathy by ultrasound. Numerous bilateral simple breast cysts. Stereotactic guided core biopsy was recommended.  Accordingly on 03/19/2018 she proceeded to biopsy of the right breast area in question. The pathology from this procedure showed ([id number]): complex sclerosing lesion with ductal hyperplasia. Fibrocystic changes with usual ductal hyperplasia.   She was then referred to surgery and after appropriate discussion on 04/30/2018 she underwent a right breast lumpectomy, showing extensive ductal carcinoma IN SITU (DCIS), intermediate nuclear grade with extension to multiple foci of sclerosing adenosis and complex sclerosing lesions. Negative for invasive carcinoma. All resection margins are negative for DCIS; closest is the anterior margin at 1.5 mm. Both medial and lateral margins are at 3 mm.  The prognostic panel showed the estrogen  receptor at 100%, with strong staining intensity, estrogen receptor 90% positive, strong staining intensity. Stage 0 (pTis, pNX)  The patient's subsequent history is as detailed below.  INTERVAL HISTORY: Tabatha returns today for follow-up of her estrogen receptor positive non-invasive breast cancer. She is accompanied by her husband.  The patient continues on anastrozole, which she is tolerating moderately well. She is experiencing consistent myalgias in her neck, hips, and shoulders, but not in her hands. She has about three or four extreme hot flashes per day.   Her bone density scan and mammography is expected in 02/2019.   Since her last visit, she had a fracture in her foot after experiencing syncope football game. She believes that the syncope was caused by fatigue from treatment.    REVIEW OF SYSTEMS: Crysten is doing well overal. She has been unable to exercise since her foot fracture. She has had some nausea, treated with medication. The patient denies unusual headaches, visual changes, nausea, vomiting, or dizziness. There has been no unusual cough, phlegm production, or pleurisy. This been no change in bowel or bladder habits. The patient denies unexplained fatigue or unexplained weight loss, bleeding, rash, or fever. A detailed review of systems was otherwise noncontributory.    PAST MEDICAL HISTORY: Past Medical History:  Diagnosis Date  . Complication of anesthesia   . Dyspareunia 05/20/2014   Early menopause age 57  . Fatigue   . GERD (gastroesophageal reflux disease)   . Headache   . Heterozygous factor V Leiden mutation ([place]) 05/20/2014   Dx 2007  No personal hx of thrombosis;  mother DVT age 56 on OC  . History of hiatal hernia   . History of radiation therapy 06/11/18- 07/06/18   Right breast- 15 fractions for a total dose of 40.05 Gy, Right breast boost- 5 fractions for a total dose of 10 Gy  .  Hypertension   . Hypothyroidism   . Osteopenia   . PONV (postoperative  nausea and vomiting)   . Sleep apnea     PAST SURGICAL HISTORY: Past Surgical History:  Procedure Laterality Date  . CESAREAN SECTION    . DIAGNOSTIC LAPAROSCOPY     for endometriosis  . GANGLION CYST EXCISION Right    right wrist  . INGUINAL HERNIA REPAIR     x 2 , at 18 months and 57 years old.   [name] RADIOACTIVE SEED GUIDED EXCISIONAL BREAST BIOPSY Right 04/30/2018   Procedure: RIGHT RADIOACTIVE SEED GUIDED EXCISIONAL BREAST BIOPSY ERAS PATHWAY;  Surgeon: Rolm Bookbinder, MD;  Location: [facility];  Service: General;  Laterality: Right;  . SINUS EXPLORATION  1990    FAMILY HISTORY Family History  Problem Relation Age of Onset  . CAD Mother   . Deep vein thrombosis Mother   . Hyperlipidemia Mother   . COPD Mother   . AAA (abdominal aortic aneurysm) Mother   . Dementia Mother   . COPD Father   . CVA Brother   . Diabetes Brother   . Hyperlipidemia Brother    The patient's father died at the age of 55 from emphysema.  The patient's mother is still living as of July 2019, age 55  She had a history of superficial clots and some cardiovascular disease.  The patient had one brother who died at age 42 from a stroke.  The patient had 2 sisters.  There is no history of breast cancer ovarian cancer or other clotting history in the family to her knowledge   GYNECOLOGIC HISTORY:  No LMP recorded. Patient is postmenopausal. Menarche: 57 years old Age at first live birth: 57 years old [place] P 1 LMP was at 57 years old Took oral contraceptives for many years (history of endometriosis) HRT no  Hysterectomy? No  BSO? No   SOCIAL HISTORY: (As of July 2019) She is a Marine scientist at [employer]. Her husband, Gershon Mussel, builds Medical sales representative for [employer]. Their son Barnabas Lister, 30, is a Engineer, building services. Gershon Mussel has two children from a pervious marriage, Raquel Sarna, who lives in [place], and Fate, lives in [place].  There are no grandchildren.  Cathlyn attends a [employer].    ADVANCED DIRECTIVES: The patient's husband  is her healthcare power of attorney   HEALTH MAINTENANCE: Social History   Tobacco Use  . Smoking status: Never Smoker  . Smokeless tobacco: Never Used  Substance Use Topics  . Alcohol use: Yes    Comment: occasional  . Drug use: No     Colonoscopy: UTD/ [facility]  PAP: UTD/ [facility]  Bone density: 04/25/2016, t-score of -0.9   Allergies  Allergen Reactions  . Codeine Hives and Rash    Current Outpatient Medications  Medication Sig Dispense Refill  . Acetaminophen (TYLENOL EXTRA STRENGTH PO) Take 1,000 mg by mouth daily as needed (headache/pain).     [name] anastrozole (ARIMIDEX) 1 MG tablet Take 1 tablet (1 mg total) by mouth daily. 90 tablet 4  . aspirin EC 81 MG tablet Take 81 mg by mouth daily.    . Calcium Citrate (CITRACAL PO) Take 2 tablets by mouth daily.     [name] desvenlafaxine (PRISTIQ) 50 MG 24 hr tablet Take 50 mg by mouth daily.    [name] docusate sodium (COLACE) 100 MG capsule Take 100 mg by mouth as needed for mild constipation.    [name] escitalopram (LEXAPRO) 10 MG tablet Take 10 mg by mouth at bedtime.   6  .  FOLIC ACID PO Take 5,929 mcg by mouth daily.     . Hydrocortisone (PROCTOSOL HC RE) Place 1 application rectally 2 (two) times daily as needed (hemmorrhoids).    [name] levothyroxine (SYNTHROID, LEVOTHROID) 50 MCG tablet Take 50 mcg by mouth daily before breakfast.    . lisinopril (PRINIVIL,ZESTRIL) 20 MG tablet Take 20 mg by mouth daily.  11  . Lysine 500 MG TABS Take 500 mg by mouth daily.     . Magnesium 250 MG TABS Take 250 mg by mouth at bedtime.    . Melatonin 3 MG CAPS Take 3 mg by mouth at bedtime.     . Multiple Vitamin (MULTIVITAMIN WITH MINERALS) TABS tablet Take 1 tablet by mouth daily.    [name] nystatin-triamcinolone ointment (MYCOLOG) Apply 1 application topically 2 (two) times daily as needed.    . pantoprazole (PROTONIX) 40 MG tablet Take 40 mg by mouth daily.    . Probiotic Product (ALIGN PO) Take 1 tablet by mouth daily.     . ranitidine (ZANTAC) 150 MG tablet Take  150 mg by mouth 2 (two) times daily.    . valACYclovir (VALTREX) 1000 MG tablet TAKE 1/2 TABLET DAILY FOR SUPPRESSION  5  . Vitamin D, Ergocalciferol, 2000 units CAPS Take 2,000 Units by mouth daily.      No current facility-administered medications for this visit.     OBJECTIVE: Middle-aged white woman using crutches, with a boot on the right foot  Vitals:   10/30/18 1400  BP: 127/80  Pulse: 76  Resp: 18  Temp: 98.6 F (37 C)  SpO2: 100%     Body mass index is 35.25 kg/m.   Wt Readings from Last 3 Encounters:  10/30/18 218 lb 6.4 oz (99.1 kg)  10/08/18 222 lb 6.4 oz (100.9 kg)  08/03/18 225 lb 12.8 oz (102.4 kg)      ECOG FS:1 - Symptomatic but completely ambulatory  Sclerae unicteric, pupils round and equal No cervical or supraclavicular adenopathy Lungs no rales or rhonchi Heart regular rate and rhythm Abd soft, nontender, positive bowel sounds MSK no focal spinal tenderness, no upper extremity lymphedema Neuro: nonfocal, well oriented, appropriate affect Breasts: Right breast is status post lumpectomy and radiation.  There is minimal erythema remaining from the radiation treatments.  The area of discomfort under the breast is unremarkable by inspection and palpation the left breast is benign.  Both axillae are benign.  LAB RESULTS:  CMP     Component Value Date/Time   NA 141 05/28/2018 1520   NA 140 05/02/2006 1027   K 4.0 05/28/2018 1520   K 4.0 05/02/2006 1027   CL 107 05/28/2018 1520   CL 104 05/02/2006 1027   CO2 26 05/28/2018 1520   CO2 22 05/02/2006 1027   GLUCOSE 108 (H) 05/28/2018 1520   GLUCOSE 99 05/02/2006 1027   BUN 15 05/28/2018 1520   BUN 12 05/02/2006 1027   CREATININE 1.11 (H) 05/28/2018 1520   CREATININE 0.9 05/02/2006 1027   CALCIUM 9.7 05/28/2018 1520   CALCIUM 9.6 05/02/2006 1027   PROT 7.7 05/28/2018 1520   ALBUMIN 4.5 05/28/2018 1520   AST 37 05/28/2018 1520   ALT 85 (H) 05/28/2018 1520   ALKPHOS 66 05/28/2018 1520   BILITOT 0.6  05/28/2018 1520   GFRNONAA 55 (L) 05/28/2018 1520   GFRAA >60 05/28/2018 1520    No results found for: TOTALPROTELP, ALBUMINELP, A1GS, A2GS, BETS, BETA2SER, GAMS, MSPIKE, SPEI  No results found for: KPAFRELGTCHN, LAMBDASER, KAPLAMBRATIO  Lab Results  Component Value Date   WBC 6.1 05/28/2018   NEUTROABS 3.3 05/28/2018   HGB 12.7 05/28/2018   HCT 37.5 05/28/2018   MCV 94.2 05/28/2018   PLT 228 05/28/2018    '@LASTCHEMISTRY'$ @  No results found for: LABCA2  No components found for: [id number]  No results for input(s): INR in the last 168 hours.  No results found for: LABCA2  No results found for: QXI503  No results found for: UUE280  No results found for: KLK917  No results found for: CA2729  No components found for: HGQUANT  No results found for: CEA1 / No results found for: CEA1   No results found for: AFPTUMOR  No results found for: CHROMOGRNA  No results found for: PSA1  No visits with results within 3 Day(s) from this visit.  Latest known visit with results is:  Appointment on 05/28/2018  Component Date Value Ref Range Status  . WBC Count 05/28/2018 6.1  3.9 - 10.3 K/uL Final  . RBC 05/28/2018 3.98  3.70 - 5.45 MIL/uL Final  . Hemoglobin 05/28/2018 12.7  11.6 - 15.9 g/dL Final  . HCT 05/28/2018 37.5  34.8 - 46.6 % Final  . MCV 05/28/2018 94.2  79.5 - 101.0 fL Final  . MCH 05/28/2018 31.9  25.1 - 34.0 pg Final  . MCHC 05/28/2018 33.9  31.5 - 36.0 g/dL Final  . RDW 05/28/2018 12.3  11.2 - 14.5 % Final  . Platelet Count 05/28/2018 228  145 - 400 K/uL Final  . Neutrophils Relative % 05/28/2018 53  % Final  . Neutro Abs 05/28/2018 3.3  1.5 - 6.5 K/uL Final  . Lymphocytes Relative 05/28/2018 33  % Final  . Lymphs Abs 05/28/2018 2.0  0.9 - 3.3 K/uL Final  . Monocytes Relative 05/28/2018 8  % Final  . Monocytes Absolute 05/28/2018 0.5  0.1 - 0.9 K/uL Final  . Eosinophils Relative 05/28/2018 5  % Final  . Eosinophils Absolute 05/28/2018 0.3  0.0 - 0.5 K/uL  Final  . Basophils Relative 05/28/2018 1  % Final  . Basophils Absolute 05/28/2018 0.0  0.0 - 0.1 K/uL Final   Performed at [facility], [place] [address], [place], [place] [address]  . Sodium 05/28/2018 141  135 - 145 mmol/L Final   Please note reference intervals were recently updated.  . Potassium 05/28/2018 4.0  3.5 - 5.1 mmol/L Final  . Chloride 05/28/2018 107  98 - 111 mmol/L Final  . CO2 05/28/2018 26  22 - 32 mmol/L Final  . Glucose, Bld 05/28/2018 108* 70 - 99 mg/dL Final  . BUN 05/28/2018 15  6 - 20 mg/dL Final   Please note change in reference range.  . Creatinine 05/28/2018 1.11* 0.44 - 1.00 mg/dL Final  . Calcium 05/28/2018 9.7  8.9 - 10.3 mg/dL Final  . Total Protein 05/28/2018 7.7  6.5 - 8.1 g/dL Final  . Albumin 05/28/2018 4.5  3.5 - 5.0 g/dL Final  . AST 05/28/2018 37  15 - 41 U/L Final  . ALT 05/28/2018 85* 0 - 44 U/L Final  . Alkaline Phosphatase 05/28/2018 66  38 - 126 U/L Final  . Total Bilirubin 05/28/2018 0.6  0.3 - 1.2 mg/dL Final  . GFR, Est Non Af Am 05/28/2018 55* >60 mL/min Final  . GFR, Est AFR Am 05/28/2018 >60  >60 mL/min Final   Comment: (NOTE) The eGFR has been calculated using the CKD EPI equation. This calculation has not been validated in  all clinical situations. eGFR's persistently <60 mL/min signify possible Chronic Kidney Disease.   Georgiann Hahn gap 05/28/2018 8  5 - 15 Final   Performed at [facility], [place] [address] [place], [place] [address]    (this displays the last labs from the last 3 days)  No results found for: TOTALPROTELP, ALBUMINELP, A1GS, A2GS, BETS, BETA2SER, GAMS, MSPIKE, SPEI (this displays SPEP labs)  No results found for: KPAFRELGTCHN, LAMBDASER, KAPLAMBRATIO (kappa/lambda light chains)  No results found for: HGBA, HGBA2QUANT, HGBFQUANT, HGBSQUAN (Hemoglobinopathy evaluation)   No results found for: LDH  No results found for: IRON, TIBC, IRONPCTSAT (Iron and TIBC)  No  results found for: FERRITIN  Urinalysis No results found for: COLORURINE, APPEARANCEUR, LABSPEC, PHURINE, GLUCOSEU, HGBUR, BILIRUBINUR, KETONESUR, PROTEINUR, UROBILINOGEN, NITRITE, LEUKOCYTESUR   STUDIES: Ct Cardiac Scoring  Result Date: 10/28/2018 CLINICAL DATA:  Family history of coronary artery disease. Right breast cancer with lumpectomy and radiation therapy. Hyperlipidemia. EXAM: CT HEART FOR CALCIUM SCORING TECHNIQUE: CT heart was performed on a 64 channel system using prospective ECG gating. A non-contrast exam for calcium scoring was performed. Note that this exam targets the heart and the chest was not imaged in its entirety. COMPARISON:  None. FINDINGS: Technical quality: Good. CORONARY CALCIUM Total Agatston Score: 47.0-mild coronary calcification identified within the mid LAD on image 22/2 and within the proximal LAD or distal left main on 26/2. MESA database percentile:  91st OTHER FINDINGS: Cardiovascular: Aortic atherosclerosis. Normal heart size, without pericardial effusion. Mediastinum/Nodes: No imaged thoracic adenopathy. Lungs/Pleura: No pleural fluid.  Clear imaged lungs. Upper Abdomen: Marked hepatic steatosis with variant lateral segment left liver lobe extending into the left upper quadrant. Normal imaged portions of the stomach, spleen. Musculoskeletal: Right breast skin thickening is mild and likely radiation induced. No acute osseous abnormality. IMPRESSION: 1. Total Agatston score of 47.0, corresponding to 91st percentile for age, sex, and race based cohort. 2.  Aortic Atherosclerosis (ICD10-I70.0). 3. Hepatic steatosis. Electronically Signed   By: Abigail Miyamoto M.D.   On: 10/28/2018 07:30    ELIGIBLE FOR AVAILABLE RESEARCH PROTOCOL: no  ASSESSMENT: 57 y.o. [place] woman status post right lumpectomy 04/30/2018 for extensive ductal carcinoma in situ, grade 2, estrogen and progesterone receptor positive  (1) adjuvant radiation  06/11/2018 - 07/06/2018 Site/dose:   1.  Right breast, 2.67 Gy in 15 fractions for a total dose of 40.05 Gy                      2. Right breast boost, 2 Gy in 5 fractions for a total dose of 10 Gy  (2) anastrozole started 07/24/2018  (a) bone density June 2017 was normal  PLAN: [place] is very motivated to continue on anastrozole.  She certainly is having more hot flashes and some of the arthralgias and myalgias may be related, but also could be related to the recent minor trauma that she has experienced and the fact that she is relatively immobilized.  At any rate her plan is to push along with anastrozole.  She will have mammography and a bone density in April.  She will return to see me in May.  At that time if she is tolerating it well the plan will be to continue for a total of 5 years and I will start seeing her on a once a year basis  She knows to call for any other issues that may develop before the next visit.   , Virgie Dad, MD  10/30/18 2:26 PM Medical Oncology and Hematology [facility] [address] [place], [place] [address] Tel. [phone]    Fax. [phone]   I, Jacqualyn Posey am acting as a Education administrator for Chauncey Cruel, MD.   I, Lurline Del MD, have reviewed the above documentation for accuracy and completeness, and I agree with the above.

## 2018-10-30 ENCOUNTER — Inpatient Hospital Stay: Payer: 59 | Attending: Oncology | Admitting: Oncology

## 2018-10-30 ENCOUNTER — Telehealth: Payer: Self-pay | Admitting: Oncology

## 2018-10-30 VITALS — BP 127/80 | HR 76 | Temp 98.6°F | Resp 18 | Ht 66.0 in | Wt 218.4 lb

## 2018-10-30 DIAGNOSIS — M8580 Other specified disorders of bone density and structure, unspecified site: Secondary | ICD-10-CM | POA: Insufficient documentation

## 2018-10-30 DIAGNOSIS — M791 Myalgia, unspecified site: Secondary | ICD-10-CM | POA: Insufficient documentation

## 2018-10-30 DIAGNOSIS — D0511 Intraductal carcinoma in situ of right breast: Secondary | ICD-10-CM | POA: Insufficient documentation

## 2018-10-30 DIAGNOSIS — Z170 Estrogen receptor positive status [ER+]: Secondary | ICD-10-CM | POA: Diagnosis not present

## 2018-10-30 DIAGNOSIS — Z7982 Long term (current) use of aspirin: Secondary | ICD-10-CM | POA: Insufficient documentation

## 2018-10-30 DIAGNOSIS — G4730 Sleep apnea, unspecified: Secondary | ICD-10-CM | POA: Insufficient documentation

## 2018-10-30 DIAGNOSIS — Z923 Personal history of irradiation: Secondary | ICD-10-CM | POA: Insufficient documentation

## 2018-10-30 DIAGNOSIS — Z79811 Long term (current) use of aromatase inhibitors: Secondary | ICD-10-CM | POA: Diagnosis not present

## 2018-10-30 DIAGNOSIS — Z79899 Other long term (current) drug therapy: Secondary | ICD-10-CM | POA: Insufficient documentation

## 2018-10-30 DIAGNOSIS — E785 Hyperlipidemia, unspecified: Secondary | ICD-10-CM | POA: Insufficient documentation

## 2018-10-30 DIAGNOSIS — E039 Hypothyroidism, unspecified: Secondary | ICD-10-CM | POA: Insufficient documentation

## 2018-10-30 DIAGNOSIS — I10 Essential (primary) hypertension: Secondary | ICD-10-CM | POA: Diagnosis not present

## 2018-10-30 DIAGNOSIS — Z780 Asymptomatic menopausal state: Secondary | ICD-10-CM | POA: Diagnosis not present

## 2018-10-30 DIAGNOSIS — R232 Flushing: Secondary | ICD-10-CM | POA: Diagnosis not present

## 2018-10-30 DIAGNOSIS — K760 Fatty (change of) liver, not elsewhere classified: Secondary | ICD-10-CM | POA: Insufficient documentation

## 2018-10-30 DIAGNOSIS — I700 Atherosclerosis of aorta: Secondary | ICD-10-CM | POA: Diagnosis not present

## 2018-10-30 MED ORDER — ANASTROZOLE 1 MG PO TABS: 1.0000 mg | ORAL_TABLET | Freq: Every day | ORAL | 4 refills | Status: DC

## 2018-10-30 NOTE — Telephone Encounter (Signed)
Gave avs and calendar ° °

## 2018-11-09 DIAGNOSIS — M25571 Pain in right ankle and joints of right foot: Secondary | ICD-10-CM | POA: Diagnosis not present

## 2018-11-26 DIAGNOSIS — R2689 Other abnormalities of gait and mobility: Secondary | ICD-10-CM | POA: Diagnosis not present

## 2018-11-26 DIAGNOSIS — M25671 Stiffness of right ankle, not elsewhere classified: Secondary | ICD-10-CM | POA: Diagnosis not present

## 2018-11-28 DIAGNOSIS — M25671 Stiffness of right ankle, not elsewhere classified: Secondary | ICD-10-CM | POA: Diagnosis not present

## 2018-11-28 DIAGNOSIS — R2689 Other abnormalities of gait and mobility: Secondary | ICD-10-CM | POA: Diagnosis not present

## 2018-12-03 DIAGNOSIS — R2689 Other abnormalities of gait and mobility: Secondary | ICD-10-CM | POA: Diagnosis not present

## 2018-12-03 DIAGNOSIS — M25671 Stiffness of right ankle, not elsewhere classified: Secondary | ICD-10-CM | POA: Diagnosis not present

## 2018-12-05 DIAGNOSIS — R2689 Other abnormalities of gait and mobility: Secondary | ICD-10-CM | POA: Diagnosis not present

## 2018-12-05 DIAGNOSIS — M25671 Stiffness of right ankle, not elsewhere classified: Secondary | ICD-10-CM | POA: Diagnosis not present

## 2018-12-12 DIAGNOSIS — M25671 Stiffness of right ankle, not elsewhere classified: Secondary | ICD-10-CM | POA: Diagnosis not present

## 2018-12-12 DIAGNOSIS — R2689 Other abnormalities of gait and mobility: Secondary | ICD-10-CM | POA: Diagnosis not present

## 2018-12-14 DIAGNOSIS — M79671 Pain in right foot: Secondary | ICD-10-CM | POA: Diagnosis not present

## 2018-12-14 DIAGNOSIS — M722 Plantar fascial fibromatosis: Secondary | ICD-10-CM | POA: Diagnosis not present

## 2018-12-14 DIAGNOSIS — M25671 Stiffness of right ankle, not elsewhere classified: Secondary | ICD-10-CM | POA: Diagnosis not present

## 2018-12-17 DIAGNOSIS — M25671 Stiffness of right ankle, not elsewhere classified: Secondary | ICD-10-CM | POA: Diagnosis not present

## 2018-12-17 DIAGNOSIS — R2689 Other abnormalities of gait and mobility: Secondary | ICD-10-CM | POA: Diagnosis not present

## 2018-12-19 DIAGNOSIS — M25671 Stiffness of right ankle, not elsewhere classified: Secondary | ICD-10-CM | POA: Diagnosis not present

## 2018-12-19 DIAGNOSIS — M722 Plantar fascial fibromatosis: Secondary | ICD-10-CM | POA: Diagnosis not present

## 2018-12-19 DIAGNOSIS — R2689 Other abnormalities of gait and mobility: Secondary | ICD-10-CM | POA: Diagnosis not present

## 2018-12-20 DIAGNOSIS — E038 Other specified hypothyroidism: Secondary | ICD-10-CM | POA: Diagnosis not present

## 2018-12-24 DIAGNOSIS — M25671 Stiffness of right ankle, not elsewhere classified: Secondary | ICD-10-CM | POA: Diagnosis not present

## 2018-12-24 DIAGNOSIS — R2689 Other abnormalities of gait and mobility: Secondary | ICD-10-CM | POA: Diagnosis not present

## 2018-12-26 DIAGNOSIS — M25671 Stiffness of right ankle, not elsewhere classified: Secondary | ICD-10-CM | POA: Diagnosis not present

## 2018-12-26 DIAGNOSIS — R2689 Other abnormalities of gait and mobility: Secondary | ICD-10-CM | POA: Diagnosis not present

## 2018-12-31 DIAGNOSIS — M25671 Stiffness of right ankle, not elsewhere classified: Secondary | ICD-10-CM | POA: Diagnosis not present

## 2018-12-31 DIAGNOSIS — R2689 Other abnormalities of gait and mobility: Secondary | ICD-10-CM | POA: Diagnosis not present

## 2019-01-02 DIAGNOSIS — M25671 Stiffness of right ankle, not elsewhere classified: Secondary | ICD-10-CM | POA: Diagnosis not present

## 2019-01-02 DIAGNOSIS — R2689 Other abnormalities of gait and mobility: Secondary | ICD-10-CM | POA: Diagnosis not present

## 2019-01-07 DIAGNOSIS — R2689 Other abnormalities of gait and mobility: Secondary | ICD-10-CM | POA: Diagnosis not present

## 2019-01-07 DIAGNOSIS — M25671 Stiffness of right ankle, not elsewhere classified: Secondary | ICD-10-CM | POA: Diagnosis not present

## 2019-01-09 DIAGNOSIS — M25671 Stiffness of right ankle, not elsewhere classified: Secondary | ICD-10-CM | POA: Diagnosis not present

## 2019-01-09 DIAGNOSIS — R2689 Other abnormalities of gait and mobility: Secondary | ICD-10-CM | POA: Diagnosis not present

## 2019-01-14 DIAGNOSIS — G4733 Obstructive sleep apnea (adult) (pediatric): Secondary | ICD-10-CM | POA: Diagnosis not present

## 2019-01-14 DIAGNOSIS — R2689 Other abnormalities of gait and mobility: Secondary | ICD-10-CM | POA: Diagnosis not present

## 2019-01-14 DIAGNOSIS — M25671 Stiffness of right ankle, not elsewhere classified: Secondary | ICD-10-CM | POA: Diagnosis not present

## 2019-01-16 DIAGNOSIS — M25671 Stiffness of right ankle, not elsewhere classified: Secondary | ICD-10-CM | POA: Diagnosis not present

## 2019-01-16 DIAGNOSIS — R2689 Other abnormalities of gait and mobility: Secondary | ICD-10-CM | POA: Diagnosis not present

## 2019-01-21 DIAGNOSIS — R2689 Other abnormalities of gait and mobility: Secondary | ICD-10-CM | POA: Diagnosis not present

## 2019-01-21 DIAGNOSIS — M25671 Stiffness of right ankle, not elsewhere classified: Secondary | ICD-10-CM | POA: Diagnosis not present

## 2019-01-23 DIAGNOSIS — M25671 Stiffness of right ankle, not elsewhere classified: Secondary | ICD-10-CM | POA: Diagnosis not present

## 2019-01-23 DIAGNOSIS — R2689 Other abnormalities of gait and mobility: Secondary | ICD-10-CM | POA: Diagnosis not present

## 2019-01-28 DIAGNOSIS — R2689 Other abnormalities of gait and mobility: Secondary | ICD-10-CM | POA: Diagnosis not present

## 2019-01-28 DIAGNOSIS — M25671 Stiffness of right ankle, not elsewhere classified: Secondary | ICD-10-CM | POA: Diagnosis not present

## 2019-02-04 DIAGNOSIS — M25671 Stiffness of right ankle, not elsewhere classified: Secondary | ICD-10-CM | POA: Diagnosis not present

## 2019-02-04 DIAGNOSIS — R2689 Other abnormalities of gait and mobility: Secondary | ICD-10-CM | POA: Diagnosis not present

## 2019-02-06 DIAGNOSIS — M25571 Pain in right ankle and joints of right foot: Secondary | ICD-10-CM | POA: Diagnosis not present

## 2019-02-06 DIAGNOSIS — S82851D Displaced trimalleolar fracture of right lower leg, subsequent encounter for closed fracture with routine healing: Secondary | ICD-10-CM | POA: Diagnosis not present

## 2019-02-06 DIAGNOSIS — Z09 Encounter for follow-up examination after completed treatment for conditions other than malignant neoplasm: Secondary | ICD-10-CM | POA: Diagnosis not present

## 2019-02-14 DIAGNOSIS — M25671 Stiffness of right ankle, not elsewhere classified: Secondary | ICD-10-CM | POA: Diagnosis not present

## 2019-02-14 DIAGNOSIS — R2689 Other abnormalities of gait and mobility: Secondary | ICD-10-CM | POA: Diagnosis not present

## 2019-02-20 DIAGNOSIS — M25671 Stiffness of right ankle, not elsewhere classified: Secondary | ICD-10-CM | POA: Diagnosis not present

## 2019-02-20 DIAGNOSIS — R2689 Other abnormalities of gait and mobility: Secondary | ICD-10-CM | POA: Diagnosis not present

## 2019-02-27 DIAGNOSIS — M25671 Stiffness of right ankle, not elsewhere classified: Secondary | ICD-10-CM | POA: Diagnosis not present

## 2019-02-27 DIAGNOSIS — R2689 Other abnormalities of gait and mobility: Secondary | ICD-10-CM | POA: Diagnosis not present

## 2019-03-07 DIAGNOSIS — M25671 Stiffness of right ankle, not elsewhere classified: Secondary | ICD-10-CM | POA: Diagnosis not present

## 2019-03-07 DIAGNOSIS — R2689 Other abnormalities of gait and mobility: Secondary | ICD-10-CM | POA: Diagnosis not present

## 2019-03-12 DIAGNOSIS — E785 Hyperlipidemia, unspecified: Secondary | ICD-10-CM | POA: Diagnosis not present

## 2019-03-12 DIAGNOSIS — E039 Hypothyroidism, unspecified: Secondary | ICD-10-CM | POA: Diagnosis not present

## 2019-03-18 ENCOUNTER — Other Ambulatory Visit: Payer: 59

## 2019-03-20 DIAGNOSIS — S82851D Displaced trimalleolar fracture of right lower leg, subsequent encounter for closed fracture with routine healing: Secondary | ICD-10-CM | POA: Diagnosis not present

## 2019-03-20 DIAGNOSIS — Z09 Encounter for follow-up examination after completed treatment for conditions other than malignant neoplasm: Secondary | ICD-10-CM | POA: Diagnosis not present

## 2019-03-20 DIAGNOSIS — M25571 Pain in right ankle and joints of right foot: Secondary | ICD-10-CM | POA: Diagnosis not present

## 2019-04-01 ENCOUNTER — Other Ambulatory Visit: Payer: Self-pay

## 2019-04-01 ENCOUNTER — Ambulatory Visit
Admission: RE | Admit: 2019-04-01 | Discharge: 2019-04-01 | Disposition: A | Discharge status: Home / Self Care | Payer: 59 | Source: Ambulatory Visit | Attending: Oncology | Admitting: Oncology

## 2019-04-01 DIAGNOSIS — D0511 Intraductal carcinoma in situ of right breast: Secondary | ICD-10-CM | POA: Diagnosis not present

## 2019-04-02 ENCOUNTER — Other Ambulatory Visit: Payer: Self-pay | Admitting: Oncology

## 2019-04-02 DIAGNOSIS — M25671 Stiffness of right ankle, not elsewhere classified: Secondary | ICD-10-CM | POA: Diagnosis not present

## 2019-04-10 ENCOUNTER — Telehealth: Payer: Self-pay | Admitting: Oncology

## 2019-04-10 DIAGNOSIS — S82851K Displaced trimalleolar fracture of right lower leg, subsequent encounter for closed fracture with nonunion: Secondary | ICD-10-CM | POA: Diagnosis not present

## 2019-04-10 NOTE — Telephone Encounter (Signed)
Rescheduled 5/26 appt per sch msg. Called and left msg for patient

## 2019-04-10 NOTE — Telephone Encounter (Signed)
Patient called back about change in appt dates. Wants to see if possible to come in earlier than her new appt date in august due to some issues that she is currently having. Sent message to Dr. Jana Hakim. Informed patient will call back after I hear back from MD

## 2019-04-11 ENCOUNTER — Other Ambulatory Visit: Payer: 59

## 2019-04-16 ENCOUNTER — Inpatient Hospital Stay: Payer: 59 | Admitting: Oncology

## 2019-05-08 ENCOUNTER — Encounter: Payer: Self-pay | Admitting: Oncology

## 2019-05-13 ENCOUNTER — Inpatient Hospital Stay: Payer: 59 | Attending: Oncology | Admitting: Oncology

## 2019-05-13 ENCOUNTER — Other Ambulatory Visit: Payer: Self-pay

## 2019-05-13 VITALS — BP 153/66 | HR 94 | Temp 99.1°F | Resp 20 | Ht 66.0 in | Wt 231.3 lb

## 2019-05-13 DIAGNOSIS — D0511 Intraductal carcinoma in situ of right breast: Secondary | ICD-10-CM | POA: Diagnosis not present

## 2019-05-13 DIAGNOSIS — E039 Hypothyroidism, unspecified: Secondary | ICD-10-CM | POA: Diagnosis not present

## 2019-05-13 DIAGNOSIS — Z79811 Long term (current) use of aromatase inhibitors: Secondary | ICD-10-CM | POA: Diagnosis not present

## 2019-05-13 DIAGNOSIS — I10 Essential (primary) hypertension: Secondary | ICD-10-CM | POA: Insufficient documentation

## 2019-05-13 DIAGNOSIS — M8580 Other specified disorders of bone density and structure, unspecified site: Secondary | ICD-10-CM | POA: Diagnosis not present

## 2019-05-13 DIAGNOSIS — Z79899 Other long term (current) drug therapy: Secondary | ICD-10-CM | POA: Insufficient documentation

## 2019-05-13 DIAGNOSIS — D6851 Activated protein C resistance: Secondary | ICD-10-CM | POA: Insufficient documentation

## 2019-05-13 DIAGNOSIS — Z170 Estrogen receptor positive status [ER+]: Secondary | ICD-10-CM | POA: Diagnosis not present

## 2019-05-13 DIAGNOSIS — Z7982 Long term (current) use of aspirin: Secondary | ICD-10-CM

## 2019-05-13 DIAGNOSIS — R21 Rash and other nonspecific skin eruption: Secondary | ICD-10-CM | POA: Diagnosis not present

## 2019-05-13 NOTE — Progress Notes (Signed)
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 She underwent bilateral diagnostic mammography with tomography and bilateral breast ultrasonography at [facility] on 03/19/2018 showing: breast density category B,  Persistent distortion in the Upper-Outer Quadrant of the Right breast without sonographic correlate. No axillary adenopathy by ultrasound. Numerous bilateral simple breast cysts. Stereotactic guided core biopsy was recommended.  Accordingly on 03/19/2018 she proceeded to biopsy of the right breast area in question. The pathology from this procedure showed ([id number]): complex sclerosing lesion with ductal hyperplasia. Fibrocystic changes with usual ductal hyperplasia.   She was then referred to surgery and after appropriate discussion on 04/30/2018 she underwent a right breast lumpectomy, showing extensive ductal carcinoma IN SITU (DCIS), intermediate nuclear grade with extension to multiple foci of sclerosing adenosis and complex sclerosing lesions. Negative for invasive carcinoma. All resection margins are negative for DCIS; closest is the anterior margin at 1.5 mm. Both medial and lateral margins are at 3 mm.  The prognostic panel showed  the estrogen receptor at 100%, with strong staining intensity, estrogen receptor 90% positive, strong staining intensity. Stage 0 (pTis, pNX)  The patient's subsequent history is as detailed below.   INTERVAL HISTORY: [name] returns today for follow-up and treatment of her estrogen receptor positive non-invasive breast cancer.   She continues on anastrozole. She notes some fatigue and muscle aches.   [name]'s last bone density screening on 04/25/2016, showed a T-score of -0.9, which is considered normal. She is scheduled for a repeat bone density screening on 06/04/2019.   Since her last visit here, she underwent a digital diagnostic bilateral mammogram with tomography on 04/01/2019 showing: Breast Density Category C.  Interval postlumpectomy changes right breast. No new masses, calcifications or nonsurgical distortion identified within the right breast. On physical examination there are multiple small cutaneous red dots along the inferior aspect of the right areola. There is no mammographic evidence for malignancy.  At the time of the mammogram and rash in the breast was noted.  This has not cleared.  She tried to see her dermatologist regarding this but apparently they cannot see her until August.  Accordingly we worked her in today for further evaluation of this problem.  Yvonne Wyatt notes that this rash has been on her breast since 12/2018, and it persists with rash. She has tried steroid creams, over-the-counter, as well as antifungals.. She was unable to see her dermatologist until 05/23/2019. She notes that she changes her bras every day. She does not wear bras to sleep.   She also had a knot develop that in the lower lateral aspect of the right breast became red and swollen, which she removed a thick white material with a 22 gage needle. The lump has resolved.    REVIEW OF SYSTEMS: [name] has been taking appropriate precautions against the spread of COVID-19. She has been unable to exercise  due to some recent surgeries. The patient denies unusual headaches, visual changes, nausea, vomiting, or dizziness. There has been no unusual cough, phlegm production, or pleurisy. This been no change in bowel or bladder habits. The patient denies unexplained weight loss, bleeding, rash, or fever. A detailed review of systems was otherwise noncontributory.    PAST MEDICAL HISTORY: Past Medical History:  Diagnosis Date   Breast cancer ([place]) 04/2018   right breast   Complication of anesthesia    Dyspareunia 05/20/2014   Early menopause age 58   Fatigue    GERD (gastroesophageal reflux disease)    Headache    Heterozygous factor V Leiden mutation ([place]) 05/20/2014   Dx 2007  No personal hx of thrombosis;  mother DVT age 56 on OC   History of hiatal hernia    History of radiation therapy 06/11/18- 07/06/18   Right breast- 15 fractions for a total dose of 40.05 Gy, Right breast boost- 5 fractions for a total dose of 10 Gy   Hypertension    Hypothyroidism    Osteopenia    PONV (postoperative nausea and vomiting)    Sleep apnea     PAST SURGICAL HISTORY: Past Surgical History:  Procedure Laterality Date   BREAST LUMPECTOMY Right 04/2018   CESAREAN SECTION     DIAGNOSTIC LAPAROSCOPY     for endometriosis   GANGLION CYST EXCISION Right    right wrist   INGUINAL HERNIA REPAIR     x 2 , at 18 months and 58 years old.    RADIOACTIVE SEED GUIDED EXCISIONAL BREAST BIOPSY Right 04/30/2018   Procedure: RIGHT RADIOACTIVE SEED GUIDED EXCISIONAL BREAST BIOPSY ERAS PATHWAY;  Surgeon: Rolm Bookbinder, MD;  Location: [facility];  Service: General;  Laterality: Right;   SINUS EXPLORATION  1990    FAMILY HISTORY Family History  Problem Relation Age of Onset   CAD Mother    Deep vein thrombosis Mother    Hyperlipidemia Mother    COPD Mother    AAA (abdominal aortic aneurysm) Mother    Dementia Mother    COPD Father    CVA Brother    Diabetes Brother     Hyperlipidemia Brother    The patient's father died at the age of 56 from emphysema.  The patient's mother is still living as of July 2019, age 56.  She had a history of superficial clots and some cardiovascular disease.  The patient had one brother who died at age 8 from a stroke.  The patient had 2 sisters.  There is no history of breast cancer ovarian cancer or other clotting history in the family to her knowledge   GYNECOLOGIC HISTORY:  No LMP recorded. Patient is postmenopausal. Menarche: 58 years old Age at first live birth: 58 years old [place] P 1 LMP was at 58 years old Took oral contraceptives for many years (history of endometriosis) HRT no  Hysterectomy? No  BSO? No   SOCIAL HISTORY: (As of July 2019) She is a Marine scientist at [employer]. Her husband, Gershon Mussel, builds Medical sales representative for [employer]. Their son Barnabas Lister, 30, is a Engineer, building services. Gershon Mussel has two children from a pervious marriage, Raquel Sarna, who lives in [place], and Phoenix, lives in [place].  There are no grandchildren.  Yarexi attends a [employer].    ADVANCED DIRECTIVES: The patient's husband is her healthcare power of attorney   HEALTH MAINTENANCE: Social History   Tobacco Use   Smoking status: Never Smoker   Smokeless tobacco: Never Used  Substance Use  Topics   Alcohol use: Yes    Comment: occasional   Drug use: No     Colonoscopy: UTD/ [facility]  PAP: UTD/ [facility] Desanctis density: 04/25/2016, t-score of -0.9   Allergies  Allergen Reactions   Codeine Hives and Rash    Current Outpatient Medications  Medication Sig Dispense Refill   Acetaminophen (TYLENOL EXTRA STRENGTH PO) Take 1,000 mg by mouth daily as needed (headache/pain).      anastrozole (ARIMIDEX) 1 MG tablet Take 1 tablet (1 mg total) by mouth daily. 90 tablet 4   aspirin EC 81 MG tablet Take 81 mg by mouth daily.     Calcium Citrate (CITRACAL PO) Take 2 tablets by mouth daily.      desvenlafaxine (PRISTIQ) 50 MG 24 hr tablet Take 50 mg by mouth daily.       docusate sodium (COLACE) 100 MG capsule Take 100 mg by mouth as needed for mild constipation.     FOLIC ACID PO Take 5,974 mcg by mouth daily.      Hydrocortisone (PROCTOSOL HC RE) Place 1 application rectally 2 (two) times daily as needed (hemmorrhoids).     levothyroxine (SYNTHROID, LEVOTHROID) 50 MCG tablet Take 1.5 tablets (75 mcg total) by mouth daily before breakfast.     lisinopril (PRINIVIL,ZESTRIL) 20 MG tablet Take 20 mg by mouth daily.  11   Lysine 500 MG TABS Take 2 tablets (1,000 mg total) by mouth daily.     Magnesium 250 MG TABS Take 250 mg by mouth at bedtime.     Melatonin 3 MG CAPS Take 3 mg by mouth at bedtime.      Multiple Vitamin (MULTIVITAMIN WITH MINERALS) TABS tablet Take 1 tablet by mouth daily.     nystatin-triamcinolone ointment (MYCOLOG) Apply 1 application topically 2 (two) times daily as needed.     pantoprazole (PROTONIX) 40 MG tablet Take 40 mg by mouth daily.     Probiotic Product (ALIGN PO) Take 1 tablet by mouth daily.      promethazine (PHENERGAN) 25 MG tablet Take 1 tablet (25 mg total) by mouth every 6 (six) hours as needed for nausea or vomiting. 30 tablet 0   rosuvastatin (CRESTOR) 10 MG tablet Take 1 tablet (10 mg total) by mouth daily.     valACYclovir (VALTREX) 1000 MG tablet TAKE 1/2 TABLET DAILY FOR SUPPRESSION  5   Vitamin D, Ergocalciferol, 2000 units CAPS Take 2,000 Units by mouth daily.      No current facility-administered medications for this visit.     OBJECTIVE: Middle-aged white woman who appears stated age  58:   05/13/19 1434  BP: (!) 153/66  Pulse: 94  Resp: 20  Temp: 99.1 F (37.3 C)  SpO2: 97%     Body mass index is 37.33 kg/m.   Wt Readings from Last 3 Encounters:  05/13/19 231 lb 4.8 oz (104.9 kg)  10/30/18 218 lb 6.4 oz (99.1 kg)  10/08/18 222 lb 6.4 oz (100.9 kg)      ECOG FS:1 - Symptomatic but completely ambulatory  Sclerae unicteric, EOMs intact Wearing a mask No cervical or  supraclavicular adenopathy Lungs no rales or rhonchi Heart regular rate and rhythm Abd soft, nontender, positive bowel sounds MSK no focal spinal tenderness, no upper extremity lymphedema Neuro: nonfocal, well oriented, appropriate affect Breasts: The right breast is status post lumpectomy followed by radiation.  The lesion in the lower lateral aspect of the right breast has completely resolved.  This breast is status post  lumpectomy and radiation with excellent cosmetic result.  The left breast is benign.  Both axillae are benign. Skin: The skin in the right areolar area is imaged below.  The erythematous rash is barely palpable, there is no subjacent mass, there is no scaliness.  The nipple itself is not involved.   05/13/2019 - rash right breast      LAB RESULTS:  CMP     Component Value Date/Time   NA 141 05/28/2018 1520   NA 140 05/02/2006 1027   K 4.0 05/28/2018 1520   K 4.0 05/02/2006 1027   CL 107 05/28/2018 1520   CL 104 05/02/2006 1027   CO2 26 05/28/2018 1520   CO2 22 05/02/2006 1027   GLUCOSE 108 (H) 05/28/2018 1520   GLUCOSE 99 05/02/2006 1027   BUN 15 05/28/2018 1520   BUN 12 05/02/2006 1027   CREATININE 1.11 (H) 05/28/2018 1520   CREATININE 0.9 05/02/2006 1027   CALCIUM 9.7 05/28/2018 1520   CALCIUM 9.6 05/02/2006 1027   PROT 7.7 05/28/2018 1520   ALBUMIN 4.5 05/28/2018 1520   AST 37 05/28/2018 1520   ALT 85 (H) 05/28/2018 1520   ALKPHOS 66 05/28/2018 1520   BILITOT 0.6 05/28/2018 1520   GFRNONAA 55 (L) 05/28/2018 1520   GFRAA >60 05/28/2018 1520    No results found for: TOTALPROTELP, ALBUMINELP, A1GS, A2GS, BETS, BETA2SER, GAMS, MSPIKE, SPEI  No results found for: KPAFRELGTCHN, LAMBDASER, KAPLAMBRATIO  Lab Results  Component Value Date   WBC 6.1 05/28/2018   NEUTROABS 3.3 05/28/2018   HGB 12.7 05/28/2018   HCT 37.5 05/28/2018   MCV 94.2 05/28/2018   PLT 228 05/28/2018    '@LASTCHEMISTRY'$ @  No results found for: LABCA2  No components found  for: YQIHKV425  No results for input(s): INR in the last 168 hours.  No results found for: LABCA2  No results found for: ZDG387  No results found for: FIE332  No results found for: RJJ884  No results found for: CA2729  No components found for: HGQUANT  No results found for: CEA1 / No results found for: CEA1   No results found for: AFPTUMOR  No results found for: CHROMOGRNA  No results found for: PSA1  No visits with results within 3 Day(s) from this visit.  Latest known visit with results is:  Appointment on 05/28/2018  Component Date Value Ref Range Status   WBC Count 05/28/2018 6.1  3.9 - 10.3 K/uL Final   RBC 05/28/2018 3.98  3.70 - 5.45 MIL/uL Final   Hemoglobin 05/28/2018 12.7  11.6 - 15.9 g/dL Final   HCT 05/28/2018 37.5  34.8 - 46.6 % Final   MCV 05/28/2018 94.2  79.5 - 101.0 fL Final   MCH 05/28/2018 31.9  25.1 - 34.0 pg Final   MCHC 05/28/2018 33.9  31.5 - 36.0 g/dL Final   RDW 05/28/2018 12.3  11.2 - 14.5 % Final   Platelet Count 05/28/2018 228  145 - 400 K/uL Final   Neutrophils Relative % 05/28/2018 53  % Final   Neutro Abs 05/28/2018 3.3  1.5 - 6.5 K/uL Final   Lymphocytes Relative 05/28/2018 33  % Final   Lymphs Abs 05/28/2018 2.0  0.9 - 3.3 K/uL Final   Monocytes Relative 05/28/2018 8  % Final   Monocytes Absolute 05/28/2018 0.5  0.1 - 0.9 K/uL Final   Eosinophils Relative 05/28/2018 5  % Final   Eosinophils Absolute 05/28/2018 0.3  0.0 - 0.5 K/uL Final   Basophils Relative 05/28/2018 1  %  Final   Basophils Absolute 05/28/2018 0.0  0.0 - 0.1 K/uL Final   Performed at [facility], [place] [address] [place], [place] [address]   Sodium 05/28/2018 141  135 - 145 mmol/L Final   Please note reference intervals were recently updated.   Potassium 05/28/2018 4.0  3.5 - 5.1 mmol/L Final   Chloride 05/28/2018 107  98 - 111 mmol/L Final   CO2 05/28/2018 26  22 - 32 mmol/L Final   Glucose, Bld 05/28/2018 108* 70 -  99 mg/dL Final   BUN 05/28/2018 15  6 - 20 mg/dL Final   Please note change in reference range.   Creatinine 05/28/2018 1.11* 0.44 - 1.00 mg/dL Final   Calcium 05/28/2018 9.7  8.9 - 10.3 mg/dL Final   Total Protein 05/28/2018 7.7  6.5 - 8.1 g/dL Final   Albumin 05/28/2018 4.5  3.5 - 5.0 g/dL Final   AST 05/28/2018 37  15 - 41 U/L Final   ALT 05/28/2018 85* 0 - 44 U/L Final   Alkaline Phosphatase 05/28/2018 66  38 - 126 U/L Final   Total Bilirubin 05/28/2018 0.6  0.3 - 1.2 mg/dL Final   GFR, Est Non Af Am 05/28/2018 55* >60 mL/min Final   GFR, Est AFR Am 05/28/2018 >60  >60 mL/min Final   Comment: (NOTE) The eGFR has been calculated using the CKD EPI equation. This calculation has not been validated in all clinical situations. eGFR's persistently <60 mL/min signify possible Chronic Kidney Disease.    Anion gap 05/28/2018 8  5 - 15 Final   Performed at [facility], [place] [address] [place], [place] [address]    (this displays the last labs from the last 3 days)  No results found for: TOTALPROTELP, ALBUMINELP, A1GS, A2GS, BETS, BETA2SER, GAMS, MSPIKE, SPEI (this displays SPEP labs)  No results found for: KPAFRELGTCHN, LAMBDASER, KAPLAMBRATIO (kappa/lambda light chains)  No results found for: HGBA, HGBA2QUANT, HGBFQUANT, HGBSQUAN (Hemoglobinopathy evaluation)   No results found for: LDH  No results found for: IRON, TIBC, IRONPCTSAT (Iron and TIBC)  No results found for: FERRITIN  Urinalysis No results found for: COLORURINE, APPEARANCEUR, LABSPEC, PHURINE, GLUCOSEU, HGBUR, BILIRUBINUR, KETONESUR, PROTEINUR, UROBILINOGEN, NITRITE, LEUKOCYTESUR   STUDIES: No results found.  ELIGIBLE FOR AVAILABLE RESEARCH PROTOCOL: no  ASSESSMENT: 58 y.o. [place] woman status post right lumpectomy 04/30/2018 for extensive ductal carcinoma in situ, grade 2, estrogen and progesterone receptor positive  (1) adjuvant radiation  06/11/2018 -  07/06/2018 Site/dose:   1. Right breast, 2.67 Gy in 15 fractions for a total dose of 40.05 Gy                      2. Right breast boost, 2 Gy in 5 fractions for a total dose of 10 Gy  (2) anastrozole started 07/24/2018  (a) bone density June 2017 was normal  PLAN: Charnita is now 1 year out from definitive surgery for her breast cancer with no evidence of disease recurrence.  This is very favorable.  She is tolerating anastrozole generally well.  Possibly some of the fatigue and mild arthralgias that she is exercising are due to this drug.  She is really not a tamoxifen candidate given her factor V Leiden mutation.  She had a bone density 2 years ago which was in the normal range.  We are repeating that later this year.  She is concerned about anastrozole and bone healing but it really does not affect bone  healing.  It can affect the quality of the bone itself.  If there is a significant drop in her bone density we will consider alendronate or similar agents  I reassured her that the rash associated with her right areolar area is not breast cancer, its not Paget's disease, and it is not a sarcoma related to her radiation.  It is I think a reaction to the radiation.  She has already tried antifungals and steroids.  I suggested she try vitamin E cream.  In any case she is seeing her dermatologist next week and I will be interested in her opinion regarding this.  Otherwise Jasmarie will see me again in November.  She knows to call for any other issue that may develop before then.  Chesney Suares, Virgie Dad, MD  05/13/19 3:30 PM Medical Oncology and Hematology [facility] [address] [place], [place] [address] Tel. [phone]    Fax. [phone]   I, Jacqualyn Posey am acting as a Education administrator for Chauncey Cruel, MD.   I, Lurline Del MD, have reviewed the above documentation for accuracy and completeness, and I agree with the above.

## 2019-05-16 ENCOUNTER — Telehealth: Payer: Self-pay | Admitting: Oncology

## 2019-05-16 NOTE — Telephone Encounter (Signed)
I left a message regarding schedule  

## 2019-06-04 ENCOUNTER — Other Ambulatory Visit: Payer: Self-pay

## 2019-06-04 ENCOUNTER — Ambulatory Visit
Admission: RE | Admit: 2019-06-04 | Discharge: 2019-06-04 | Disposition: A | Discharge status: Home / Self Care | Payer: 59 | Source: Ambulatory Visit | Attending: Oncology | Admitting: Oncology

## 2019-06-04 DIAGNOSIS — D0511 Intraductal carcinoma in situ of right breast: Secondary | ICD-10-CM

## 2019-06-17 ENCOUNTER — Encounter (INDEPENDENT_AMBULATORY_CARE_PROVIDER_SITE_OTHER): Payer: Self-pay | Admitting: Bariatrics

## 2019-06-17 ENCOUNTER — Other Ambulatory Visit: Payer: Self-pay

## 2019-06-17 ENCOUNTER — Ambulatory Visit (INDEPENDENT_AMBULATORY_CARE_PROVIDER_SITE_OTHER): Payer: 59 | Admitting: Bariatrics

## 2019-06-17 VITALS — BP 115/77 | HR 79 | Temp 98.4°F | Ht 66.0 in | Wt 224.0 lb

## 2019-06-17 DIAGNOSIS — Z9189 Other specified personal risk factors, not elsewhere classified: Secondary | ICD-10-CM

## 2019-06-17 DIAGNOSIS — E7849 Other hyperlipidemia: Secondary | ICD-10-CM

## 2019-06-17 DIAGNOSIS — R5383 Other fatigue: Secondary | ICD-10-CM | POA: Diagnosis not present

## 2019-06-17 DIAGNOSIS — Z1331 Encounter for screening for depression: Secondary | ICD-10-CM

## 2019-06-17 DIAGNOSIS — Z6836 Body mass index (BMI) 36.0-36.9, adult: Secondary | ICD-10-CM

## 2019-06-17 DIAGNOSIS — G4733 Obstructive sleep apnea (adult) (pediatric): Secondary | ICD-10-CM | POA: Diagnosis not present

## 2019-06-17 DIAGNOSIS — E038 Other specified hypothyroidism: Secondary | ICD-10-CM

## 2019-06-17 DIAGNOSIS — Z0289 Encounter for other administrative examinations: Secondary | ICD-10-CM

## 2019-06-17 DIAGNOSIS — R0602 Shortness of breath: Secondary | ICD-10-CM

## 2019-06-17 DIAGNOSIS — E559 Vitamin D deficiency, unspecified: Secondary | ICD-10-CM | POA: Diagnosis not present

## 2019-06-18 ENCOUNTER — Encounter (INDEPENDENT_AMBULATORY_CARE_PROVIDER_SITE_OTHER): Payer: Self-pay | Admitting: Bariatrics

## 2019-06-18 LAB — COMPREHENSIVE METABOLIC PANEL
ALT: 69 IU/L — ABNORMAL HIGH (ref 0–32)
AST: 38 IU/L (ref 0–40)
Albumin/Globulin Ratio: 1.8 (ref 1.2–2.2)
Albumin: 4.8 g/dL (ref 3.8–4.9)
Alkaline Phosphatase: 93 IU/L (ref 39–117)
BUN/Creatinine Ratio: 16 (ref 9–23)
BUN: 16 mg/dL (ref 6–24)
Bilirubin Total: 0.4 mg/dL (ref 0.0–1.2)
CO2: 24 mmol/L (ref 20–29)
Calcium: 9.8 mg/dL (ref 8.7–10.2)
Chloride: 101 mmol/L (ref 96–106)
Creatinine, Ser: 1.02 mg/dL — ABNORMAL HIGH (ref 0.57–1.00)
GFR calc Af Amer: 71 mL/min/{1.73_m2} (ref >59)
GFR calc non Af Amer: 62 mL/min/{1.73_m2} (ref >59)
Globulin, Total: 2.7 g/dL (ref 1.5–4.5)
Glucose: 94 mg/dL (ref 65–99)
Potassium: 4.4 mmol/L (ref 3.5–5.2)
Sodium: 141 mmol/L (ref 134–144)
Total Protein: 7.5 g/dL (ref 6.0–8.5)

## 2019-06-18 LAB — T4, FREE: Free T4: 1.26 ng/dL (ref 0.82–1.77)

## 2019-06-18 LAB — LIPID PANEL WITH LDL/HDL RATIO
Cholesterol, Total: 247 mg/dL — ABNORMAL HIGH (ref 100–199)
HDL: 47 mg/dL (ref >39)
LDL Calculated: 176 mg/dL — ABNORMAL HIGH (ref 0–99)
LDl/HDL Ratio: 3.7 ratio — ABNORMAL HIGH (ref 0.0–3.2)
Triglycerides: 120 mg/dL (ref 0–149)
VLDL Cholesterol Cal: 24 mg/dL (ref 5–40)

## 2019-06-18 LAB — TSH: TSH: 1.86 u[IU]/mL (ref 0.450–4.500)

## 2019-06-18 LAB — T3: T3, Total: 91 ng/dL (ref 71–180)

## 2019-06-18 LAB — VITAMIN D 25 HYDROXY (VIT D DEFICIENCY, FRACTURES): Vit D, 25-Hydroxy: 40.4 ng/mL (ref 30.0–100.0)

## 2019-06-18 LAB — INSULIN, RANDOM: INSULIN: 18 u[IU]/mL (ref 2.6–24.9)

## 2019-06-18 LAB — HEMOGLOBIN A1C
Est. average glucose Bld gHb Est-mCnc: 120 mg/dL
Hgb A1c MFr Bld: 5.8 % — ABNORMAL HIGH (ref 4.8–5.6)

## 2019-06-18 NOTE — Progress Notes (Signed)
Office: [phone]  /  Fax: [phone]    Date: June 19, 2019   Appointment Start Time: 8:58am Duration: 33 minutes Provider: Glennie Isle, Psy.D. Type of Session: Intake for Individual Therapy  Location of Patient: Work Location of Provider: Provider's Home Type of Contact: Telepsychological Visit via [employer]  Informed Consent: Prior to proceeding with today's appointment, two pieces of identifying information were obtained from Rarden to verify identity. In addition, Shaden's physical location at the time of this appointment was obtained. Mekiah reported she was at work and provided the address. In the event of technical difficulties, Lajuanda shared a phone number she could be reached at. Thayer Headings and this provider participated in today's telepsychological service. Also, Mishel denied anyone else being present in the room or on the WebEx appointment.   The provider's role was explained to [employer]. The provider reviewed and discussed issues of confidentiality, privacy, and limits therein (e.g., reporting obligations). In addition to verbal informed consent, written informed consent for psychological services was obtained from [place] prior to the initial intake interview. Written consent included information concerning the practice, financial arrangements, and confidentiality and patients' rights. Since the clinic is not a 24/7 crisis center, mental health emergency resources were shared, and the provider explained MyChart, e-mail, voicemail, and/or other messaging systems should be utilized only for non-emergency reasons. This provider also explained that information obtained during appointments will be placed in [facility] record in a confidential manner and relevant information will be shared with other providers at [facility] that she meets with for coordination of care. Tayanna verbally acknowledged understanding of the aforementioned, and agreed to use  mental health emergency resources discussed if needed. Moreover, Lovelyn agreed information may be shared with other [facility] providers as needed for coordination of care. By signing the service agreement document, Leiloni provided written consent for coordination of care.   Prior to initiating telepsychological services, Dreamer was provided with an informed consent document, which included the development of a safety plan (i.e., an emergency contact and emergency resources) in the event of an emergency/crisis. Brion expressed understanding of the rationale of the safety plan and provided consent for this provider to reach out to her emergency contact in the event of an emergency/crisis. Shamar returned the completed consent form prior to today's appointment. This provider verbally reviewed the consent form during today's appointment prior to proceeding with the appointment. Reem verbally acknowledged understanding that she is ultimately responsible for understanding her insurance benefits as it relates to reimbursement of telepsychological and in-person services. This provider also reviewed confidentiality, as it relates to telepsychological services, as well as the rationale for telepsychological services. More specifically, this provider's clinic is limiting in-person visits due to COVID-19. Therapeutic services will resume to in-person appointments once deemed appropriate. Kendahl expressed understanding regarding the rationale for telepsychological services. In addition, this provider explained the telepsychological services informed consent document would be considered an addendum to the initial consent document/service agreement. Jaimie verbally consented to proceed.   Chief Complaint/HPI: Jhaniya was referred by Dr. Jearld Lesch. During the initial appointment with Dr. Jearld Lesch at [facility] on June 17, 2019, Lafreda reported experiencing the  following: significant food  cravings issues , frequently drinking liquids with calories, frequently making poor food choices, frequently eating larger portions than normal , struggling with emotional eating and skipping meals frequently.   During today's appointment, Lasonya reported when she is stressed at work she will eat almonds  or "puffy mints." Eleina was verbally administered a questionnaire assessing various behaviors related to emotional eating. Jessicia endorsed the following: eat certain foods when you are anxious, stressed, depressed, or your feelings are hurt, find food is comforting to you and eat as a reward. Jaylynn noted the onset of emotional eating was likely after her previous husband passed in 2002. Currently, she described the frequency of emotional eating as "once a week." She shared she craves salt and sometimes sweets, such as chocolate. In addition, Fatiha denied a history of binge eating. Kynzlie denied a history of restricting food intake, purging and engagement in other compensatory strategies, and has never been diagnosed with an eating disorder. She also denied a history of treatment for emotional eating. Moreover, Crystallynn indicated stress triggers emotional eating, whereas not having certain foods available and managing stress makes emotional eating better. Furthermore, Symiah endorsed other problems of concern. More specifically, she expressed concern regarding her health. Sherald shared she was diagnosed with breast cancer last year and later had surgery after breaking her ankle. She added, "I'm the primary caregiver for my 24 year old mother."  Mental Status Examination:  Appearance: neat Behavior: cooperative Mood: euthymic Affect: mood congruent Speech: normal in rate, volume, and tone Eye Contact: appropriate Psychomotor Activity: appropriate Thought Process: linear, logical, and goal directed  Content/Perceptual Disturbances: denies suicidal and homicidal ideation, plan, and intent and no  hallucinations, delusions, bizarre thinking or behavior reported or observed Orientation: time, person, place and purpose of appointment Cognition/Sensorium: memory, attention, language, and fund of knowledge intact  Insight: good Judgment: good  Family & Psychosocial History: Evora reported she is married and she has one adult biological child and two step adult children. 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 Additionally, Jadine shared her highest level of education obtained is a BSN degree. Currently, Deshanae's social support system consists of her husband, sister, son, son's girlfriend, mother, and several friends at work. Moreover, Julianny stated she resides with her husband.   Medical History:  Past Medical History:  Diagnosis Date   Anxiety    Breast cancer ([facility]) 04/2018   right breast   Complication of anesthesia    Constipation    Depression    Dyspareunia 05/20/2014   Early menopause age 75   Ectatic aorta ([facility])    Fatigue    Fatty liver    GERD (gastroesophageal reflux disease)    Headache    Hemorrhoids    Heterozygous factor V Leiden mutation ([place]) 05/20/2014   Dx 2007  No personal hx of thrombosis;  mother DVT age 37 on OC   History of hiatal hernia    History of radiation therapy 06/11/18- 07/06/18   Right breast- 15 fractions for a total dose of 40.05 Gy, Right breast boost- 5 fractions for a total dose of 10 Gy   Hypertension    Hypothyroidism    Osteopenia    Plantar fasciitis    PONV (postoperative nausea and vomiting)    Sleep apnea    Vitamin D deficiency    Past Surgical History:  Procedure Laterality Date   BREAST LUMPECTOMY Right 04/2018   CESAREAN SECTION     DIAGNOSTIC LAPAROSCOPY     for endometriosis   GANGLION CYST EXCISION Right    right wrist   INGUINAL HERNIA REPAIR     x 2 , at 18 months and 58 years old.    RADIOACTIVE SEED GUIDED EXCISIONAL BREAST BIOPSY Right 04/30/2018  Procedure: RIGHT RADIOACTIVE SEED GUIDED EXCISIONAL BREAST BIOPSY ERAS PATHWAY;  Surgeon: Rolm Bookbinder, MD;  Location: [facility];  Service: General;  Laterality: Right;   SINUS EXPLORATION  1990   Current Outpatient Medications on File Prior to Visit  Medication Sig Dispense Refill   Acetaminophen (TYLENOL EXTRA STRENGTH PO) Take 1,000 mg by mouth daily as needed (headache/pain).      anastrozole (ARIMIDEX) 1 MG tablet Take 1 tablet (1 mg total) by mouth daily. 90 tablet 4   aspirin EC 81 MG tablet Take 81 mg by mouth daily.     Calcium Citrate (CITRACAL PO) Take 2 tablets by mouth daily.      desvenlafaxine (PRISTIQ) 50 MG 24 hr tablet Take 50 mg by mouth daily.     docusate sodium (COLACE) 100 MG capsule Take 100 mg by mouth as needed for mild constipation.     FOLIC ACID PO Take 0,960 mcg by mouth daily.      Hydrocortisone (PROCTOSOL HC RE) Place 1 application rectally 2 (two) times daily as needed (hemmorrhoids).     levothyroxine (SYNTHROID, LEVOTHROID) 50 MCG tablet Take 1.5 tablets (75 mcg total) by mouth daily before breakfast.     lisinopril (PRINIVIL,ZESTRIL) 20 MG tablet Take 20 mg by mouth daily.  11   Lysine 500 MG TABS Take 2 tablets (1,000 mg total) by mouth daily.     Magnesium 250 MG TABS Take 250 mg by mouth at bedtime.     Melatonin 3 MG CAPS Take 3 mg by mouth at bedtime.      Multiple Vitamin (MULTIVITAMIN WITH MINERALS) TABS tablet Take 1 tablet by mouth daily.     nystatin-triamcinolone ointment (MYCOLOG) Apply 1 application topically 2 (two) times daily as needed.     pantoprazole (PROTONIX) 40 MG tablet Take 40 mg by mouth daily.     Probiotic Product (ALIGN PO) Take 1 tablet by mouth daily.      promethazine (PHENERGAN) 25 MG tablet Take 1 tablet (25 mg total) by mouth every 6 (six) hours as needed for nausea or vomiting. 30 tablet 0   rosuvastatin (CRESTOR) 10 MG tablet Take 1 tablet (10 mg total) by mouth daily.     valACYclovir (VALTREX)  1000 MG tablet TAKE 1/2 TABLET DAILY FOR SUPPRESSION  5   Vitamin D, Ergocalciferol, 2000 units CAPS Take 2,000 Units by mouth daily.      No current facility-administered medications on file prior to visit.   Atleigh shared she fainted in September of 2019 likely due to "exhaustion" resulting in a fall. She received medical attention and had LOC for approximately one minute. In addition, Damoni reported a history of another concussion in 2003; she received medical attention.   Mental Health History: Aryn reported attending marriage counseling during her first marriage in 2 for approximately one year. Leinaala denied a history of hospitalizations for psychiatric concerns, and has never met with a psychiatrist. She noted she is prescribed Pristiq currently, and explained it is for hot flashes not for depression. Previously, she was prescribed Prozac for "situational depression" after her first husband passed away. Leyli endorsed a family history of mental health related concerns. She explained, "Some depression runs in the family." Her sister is diagnosed with depression and anxiety. She also added "some cousins had some depression." Egan denied a trauma history, including psychological, physical  and sexual abuse, as well as neglect.   Salena described her typical mood as "productive, positive, determined."  Johan reported experiencing decreased motivation,  poor appetite, and sleep concerns, which she believes are secondary to her medical conditions and current medications. Phuong endorsed "rare" alcohol use. She denied tobacco use. She denied illicit/recreational substance use. Regarding caffeine intake, Yarielis reported consuming "maybe 12oz a day of Coke or sweet tea" prior to starting with the clinic. Furthermore, Erisha denied experiencing the following: hopelessness, hallucinations and delusions, paranoia and mania. She also denied history of and current suicidal ideation, plan, and intent;  history of and current homicidal ideation, plan, and intent; and history of and current engagement in self-harm.  The following strengths were reported by Thayer Headings: strong, resilient, productive, and good initiative. The following strengths were observed by this provider: ability to express thoughts and feelings during the therapeutic session, ability to establish and benefit from a therapeutic relationship, ability to learn and practice coping skills, willingness to work toward established goal(s) with the clinic and ability to engage in reciprocal conversation.  Legal History: Kati denied a history of legal involvement.   Structured Assessment Results: The Patient Health Questionnaire-9 (PHQ-9) is a self-report measure that assesses symptoms and severity of depression over the course of the last two weeks. Roshaunda obtained a score of 12 suggesting moderate depression. Eshaal finds the endorsed symptoms to be somewhat difficult. Little interest or pleasure in doing things 0  Feeling down, depressed, or hopeless 1  Trouble falling or staying asleep, or sleeping too much 3  Feeling tired or having little energy 3  Poor appetite or overeating 3  Feeling bad about yourself --- or that you are a failure or have let yourself or your family down 0  Trouble concentrating on things, such as reading the newspaper or watching television 2  Moving or speaking so slowly that other people could have noticed? Or the opposite --- being so fidgety or restless that you have been moving around a lot more than usual 0  Thoughts that you would be better off dead or hurting yourself in some way 0  PHQ-9 Score 12    The Generalized Anxiety Disorder-7 (GAD-7) is a brief self-report measure that assesses symptoms of anxiety over the course of the last two weeks. Ciarah obtained a score of 0. Feeling nervous, anxious, on edge 0  Not being able to stop or control worrying 0  Worrying too much about different things 0    Trouble relaxing 0  Being so restless that it's hard to sit still 0  Becoming easily annoyed or irritable 0  Feeling afraid as if something awful might happen 0  GAD-7 Score 0   Interventions: A chart review was conducted prior to the clinical intake interview. The PHQ-9, and GAD-7 were verbally administered as well as a Mood and Food questionnaire to assess various behaviors related to emotional eating. Throughout session, empathic reflections and validation was provided. Salia declined future appointments with this provider. Nevertheless, psychoeducation regarding emotional versus physical hunger was provided. Brentlee was sent a handout via a MyChart message to utilize to increase awareness of hunger patterns and subsequent eating. Prior to sending the message, this provider explained the message would be visible to all providers, as it would be part of the electronic medical record. Sahej verbally acknowledged understanding, and verbally consented to this provider sending the MyChart message.  Provisional DSM-5 Diagnosis: 311 (F32.8) Other Specified Depressive Disorder, Emotional Eating Behaviors  Plan: Lanita declined future appointments with this provider and expressed confidence in her ability to follow the structured meal plan. She also expressed understanding that she may  request a follow-up appointment with this provider in the future if she is still established with the clinic. No further follow-up planned.

## 2019-06-18 NOTE — Progress Notes (Signed)
.  Office: [phone]  /  Fax: [phone]   HPI:   Chief Complaint: OBESITY  [place] Yvonne Wyatt (MR# [id number]) is a 58 y.o. female who presents on 06/18/2019 for obesity evaluation and treatment. Current BMI is Body mass index is 36.15 kg/m.Thayer Headings has struggled with obesity for years and has been unsuccessful in either losing weight or maintaining long term weight loss. Jennessy attended our information session and states she is currently in the action stage of change and ready to dedicate time achieving and maintaining a healthier weight.   Sheri does like to cook. She craves salad, [place], hard candy, cookies, etc. She considers herself to be a "picky" eater.  Casandra states her family eats meals together she thinks her family will eat healthier with her she struggles with family and or coworkers weight loss sabotage her desired weight loss is 84 lbs she started gaining weight with menopause her heaviest weight ever was 225 lbs. she is a picky eater and doesn't like to eat healthier foods  she has significant food cravings issues  she skips meals frequently she is frequently drinking liquids with calories she frequently makes poor food choices she frequently eats larger portions than normal  she struggles with emotional eating    Fatigue Remedy feels her energy is lower than it should be. This has worsened with weight gain and has worsened recently. Cassaundra denies daytime somnolence and admits to waking up still tired. Patient is at risk for obstructive sleep apnea. Patient generally gets 6-7 hours of broken sleep per night, has morning headaches, and states they generally do not have restful sleep. Snoring is not present since using CPAP. Apneic episodes are not present since using CPAP. Epworth Sleepiness Score is 2.  Dyspnea on exertion Tenisha notes increasing shortness of breath with certain activities and seems to be worsening over time with weight gain. She notes getting  out of breath sooner with activity than she used to. This has gotten worse recently. Terrica denies orthopnea.  Obstructive Sleep Apnea (OSA) with CPAP Kimmarie states she wears a CPAP to sleep at night.  Hypothyroidism Sheala has a diagnosis of hypothyroidism and is taking Synthroid.  Vitamin D deficiency Syeda has a diagnosis of Vitamin D deficiency. She is currently taking OTC Vit D and multivitamin. She denies nausea, vomiting or muscle weakness.  At risk for osteopenia and osteoporosis Jaslyne is at higher risk of osteopenia and osteoporosis due to Vitamin D deficiency.   Hyperlipidemia Rachael has hyperlipidemia and has been trying to improve her cholesterol levels with intensive lifestyle modification including a low saturated fat diet, exercise and weight loss. She is taking Crestor.  Depression Screen Denisia's Food and Mood (modified PHQ-9) score was 13. Depression screen [facility] 2/9 05/15/2018  Decreased Interest 0  Down, Depressed, Hopeless 0  PHQ - 2 Score 0   ASSESSMENT AND PLAN:  Other fatigue - Plan: EKG 12-Lead, Comprehensive metabolic panel, Hemoglobin A1c, Insulin, random  OSA (obstructive sleep apnea)  Other specified hypothyroidism - Plan: T3, T4, free, TSH  Vitamin D deficiency - Plan: VITAMIN D 25 Hydroxy (Vit-D Deficiency, Fractures)  Other hyperlipidemia - Plan: Lipid Panel With LDL/HDL Ratio  Shortness of breath on exertion  Depression screening  At risk for osteoporosis  Class 2 severe obesity with serious comorbidity and body mass index (BMI) of 36.0 to 36.9 in adult, unspecified obesity type (HCC)  PLAN:  Fatigue Jearldean was informed that her fatigue may be related to obesity, depression or many other  causes. Labs will be ordered, and in the meanwhile Vernetta has agreed to work on diet, exercise and weight loss to help with fatigue. Proper sleep hygiene was discussed including the need for 7-8 hours of quality sleep each night. A sleep study was not  ordered based on symptoms and Epworth score.  Dyspnea on exertion Allesandra's shortness of breath appears to be obesity related and exercise induced. She has agreed to work on weight loss and gradually increase exercise to treat her exercise induced shortness of breath. If Kazandra follows our instructions and loses weight without improvement of her shortness of breath, we will plan to refer to pulmonology. We will monitor this condition regularly. Moni agrees to this plan.  Obstructive Sleep Apnea (OSA) with CPAP Nikeya was instructed to continue wearing her CPAP nightly.  Hypothyroidism Querida was informed of the importance of good thyroid control to help with weight loss efforts. She was also informed that supertheraputic thyroid levels are dangerous and will not improve weight loss results. Forrest will continue Synthroid and will follow-up as directed to monitor her progress.  Vitamin D Deficiency Hortensia was informed that low Vitamin D levels contributes to fatigue and are associated with obesity, breast, and colon cancer. She agrees to continue taking OTC Vit D and will follow-up for routine testing of Vitamin D, at least 2-3 times per year. She was informed of the risk of over-replacement of Vitamin D and agrees to not increase her dose unless she discusses this with [place] first. Charelle agrees to follow-up with our clinic in 2 weeks.  At risk for osteopenia and osteoporosis Kayleena was given extended  (15 minutes) osteoporosis prevention counseling today. Talor is at risk for osteopenia and osteoporsis due to her Vitamin D deficiency. She was encouraged to take her Vitamin D and follow her higher calcium diet and increase strengthening exercise to help strengthen her bones and decrease her risk of osteopenia and osteoporosis.  Hyperlipidemia Natassja was informed of the American Heart Association Guidelines emphasizing intensive lifestyle modifications as the first line treatment for  hyperlipidemia. We discussed many lifestyle modifications today in depth, and Lumina will continue to work on decreasing saturated fats such as fatty red meat, butter and many fried foods. She will continue medications, have lipids checked, increase vegetables and lean protein in her diet, and continue to work on exercise and weight loss efforts.  Depression Screen Maren had a moderately positive depression screening. Depression is commonly associated with obesity and often results in emotional eating behaviors. We will monitor this closely and work on CBT to help improve the non-hunger eating patterns. Referral to Psychology may be required if no improvement is seen as she continues in our clinic.  Obesity Alizea is currently in the action stage of change and her goal is to continue with weight loss efforts. She has agreed to follow the Category 3 plan. Bennye will work on meal planning and intentional eating. She will not drink any regular sodas. Jezebel has been instructed to work up to a goal of 150 minutes of combined cardio and strengthening exercise per week for weight loss and overall health benefits. We discussed the following Behavioral Modification Strategies today: increasing lean protein intake, decreasing simple carbohydrates, increasing vegetables, increase H20 intake, decrease eating out, no skipping meals, work on meal planning and easy cooking plans, keeping healthy foods in the home, and planning for success.  Alisen has agreed to follow-up with our clinic in 2 weeks. She was informed of the importance of  frequent follow-up visits to maximize her success with intensive lifestyle modifications for her multiple health conditions. She was informed we would discuss her lab results at her next visit unless there is a critical issue that needs to be addressed sooner. Glynnis agreed to keep her next visit at the agreed upon time to discuss these results.  ALLERGIES: Allergies  Allergen  Reactions  . Codeine Hives and Rash    MEDICATIONS: Current Outpatient Medications on File Prior to Visit  Medication Sig Dispense Refill  . Acetaminophen (TYLENOL EXTRA STRENGTH PO) Take 1,000 mg by mouth daily as needed (headache/pain).     [name] anastrozole (ARIMIDEX) 1 MG tablet Take 1 tablet (1 mg total) by mouth daily. 90 tablet 4  . aspirin EC 81 MG tablet Take 81 mg by mouth daily.    . Calcium Citrate (CITRACAL PO) Take 2 tablets by mouth daily.     [name] desvenlafaxine (PRISTIQ) 50 MG 24 hr tablet Take 50 mg by mouth daily.    [name] docusate sodium (COLACE) 100 MG capsule Take 100 mg by mouth as needed for mild constipation.    [name] FOLIC ACID PO Take 4,098 mcg by mouth daily.     . Hydrocortisone (PROCTOSOL HC RE) Place 1 application rectally 2 (two) times daily as needed (hemmorrhoids).    [name] levothyroxine (SYNTHROID, LEVOTHROID) 50 MCG tablet Take 1.5 tablets (75 mcg total) by mouth daily before breakfast.    . lisinopril (PRINIVIL,ZESTRIL) 20 MG tablet Take 20 mg by mouth daily.  11  . Lysine 500 MG TABS Take 2 tablets (1,000 mg total) by mouth daily.    . Magnesium 250 MG TABS Take 250 mg by mouth at bedtime.    . Melatonin 3 MG CAPS Take 3 mg by mouth at bedtime.     . Multiple Vitamin (MULTIVITAMIN WITH MINERALS) TABS tablet Take 1 tablet by mouth daily.    [name] nystatin-triamcinolone ointment (MYCOLOG) Apply 1 application topically 2 (two) times daily as needed.    . pantoprazole (PROTONIX) 40 MG tablet Take 40 mg by mouth daily.    . Probiotic Product (ALIGN PO) Take 1 tablet by mouth daily.     . promethazine (PHENERGAN) 25 MG tablet Take 1 tablet (25 mg total) by mouth every 6 (six) hours as needed for nausea or vomiting. 30 tablet 0  . rosuvastatin (CRESTOR) 10 MG tablet Take 1 tablet (10 mg total) by mouth daily.    . valACYclovir (VALTREX) 1000 MG tablet TAKE 1/2 TABLET DAILY FOR SUPPRESSION  5  . Vitamin D, Ergocalciferol, 2000 units CAPS Take 2,000 Units by mouth daily.      No  current facility-administered medications on file prior to visit.     PAST MEDICAL HISTORY: Past Medical History:  Diagnosis Date  . Anxiety   . Breast cancer ([place]) 04/2018   right breast  . Complication of anesthesia   . Constipation   . Depression   . Dyspareunia 05/20/2014   Early menopause age 83  . Ectatic aorta ([place])   . Fatigue   . Fatty liver   . GERD (gastroesophageal reflux disease)   . Headache   . Hemorrhoids   . Heterozygous factor V Leiden mutation ([place]) 05/20/2014   Dx 2007  No personal hx of thrombosis;  mother DVT age 30 on OC  . History of hiatal hernia   . History of radiation therapy 06/11/18- 07/06/18   Right breast- 15 fractions for a total dose of 40.05 Gy, Right breast  boost- 5 fractions for a total dose of 10 Gy  . Hypertension   . Hypothyroidism   . Osteopenia   . Plantar fasciitis   . PONV (postoperative nausea and vomiting)   . Sleep apnea   . Vitamin D deficiency     PAST SURGICAL HISTORY: Past Surgical History:  Procedure Laterality Date  . BREAST LUMPECTOMY Right 04/2018  . CESAREAN SECTION    . DIAGNOSTIC LAPAROSCOPY     for endometriosis  . GANGLION CYST EXCISION Right    right wrist  . INGUINAL HERNIA REPAIR     x 2 , at 18 months and 58 years old.   [name] RADIOACTIVE SEED GUIDED EXCISIONAL BREAST BIOPSY Right 04/30/2018   Procedure: RIGHT RADIOACTIVE SEED GUIDED EXCISIONAL BREAST BIOPSY ERAS PATHWAY;  Surgeon: Rolm Bookbinder, MD;  Location: [place];  Service: General;  Laterality: Right;  . SINUS EXPLORATION  1990    SOCIAL HISTORY: Social History   Tobacco Use  . Smoking status: Never Smoker  . Smokeless tobacco: Never Used  Substance Use Topics  . Alcohol use: Yes    Comment: occasional  . Drug use: No    FAMILY HISTORY: Family History  Problem Relation Age of Onset  . CAD Mother   . Deep vein thrombosis Mother   . Hyperlipidemia Mother   . COPD Mother   . AAA (abdominal aortic aneurysm) Mother   . Dementia Mother   .  High blood pressure Mother   . Kidney disease Mother   . COPD Father   . Alcoholism Father   . CVA Brother   . Diabetes Brother   . Hyperlipidemia Brother    ROS: Review of Systems  Constitutional: Positive for malaise/fatigue.  Eyes:       Positive for wearing glasses or contacts. Positive for floaters.  Cardiovascular: Positive for palpitations.  Gastrointestinal: Positive for constipation, heartburn and nausea. Negative for vomiting.       Positive for rectal bleeding.  Musculoskeletal: Positive for back pain and joint pain.       Negative for muscle weakness. Positive for muscle stiffness. Positive for red or swollen joints.  Skin: Positive for rash.       Positive for hair or nail changes.  Neurological: Positive for headaches.       Positive for leg cramping.  Endo/Heme/Allergies:       Positive for heat/cold intolerance.  Psychiatric/Behavioral:       Positive for stress.   PHYSICAL EXAM: Blood pressure 115/77, pulse 79, temperature 98.4 F (36.9 C), temperature source Oral, height 5\' 6"  (1.676 m), weight 224 lb (101.6 kg), SpO2 97 %. Body mass index is 36.15 kg/m. Physical Exam Vitals signs reviewed.  Constitutional:      Appearance: Normal appearance. She is well-developed. She is obese.  HENT:     Head: Normocephalic and atraumatic.     Nose: Nose normal.  Eyes:     General: No scleral icterus. Neck:     Musculoskeletal: Normal range of motion.  Cardiovascular:     Rate and Rhythm: Normal rate and regular rhythm.  Pulmonary:     Effort: Pulmonary effort is normal. No respiratory distress.  Abdominal:     Palpations: Abdomen is soft.     Tenderness: There is no abdominal tenderness.  Musculoskeletal: Normal range of motion.     Comments: Range of motion normal in all four extremities.  Skin:    General: Skin is warm and dry.  Neurological:  Mental Status: She is alert and oriented to person, place, and time.     Coordination: Coordination normal.   Psychiatric:        Mood and Affect: Mood and affect normal.        Behavior: Behavior normal.   RECENT LABS AND TESTS: BMET    Component Value Date/Time   NA 141 06/17/2019 1253   NA 140 05/02/2006 1027   K 4.4 06/17/2019 1253   K 4.0 05/02/2006 1027   CL 101 06/17/2019 1253   CL 104 05/02/2006 1027   CO2 24 06/17/2019 1253   CO2 22 05/02/2006 1027   GLUCOSE 94 06/17/2019 1253   GLUCOSE 108 (H) 05/28/2018 1520   GLUCOSE 99 05/02/2006 1027   BUN 16 06/17/2019 1253   BUN 12 05/02/2006 1027   CREATININE 1.02 (H) 06/17/2019 1253   CREATININE 1.11 (H) 05/28/2018 1520   CREATININE 0.9 05/02/2006 1027   CALCIUM 9.8 06/17/2019 1253   CALCIUM 9.6 05/02/2006 1027   GFRNONAA 62 06/17/2019 1253   GFRNONAA 55 (L) 05/28/2018 1520   GFRAA 71 06/17/2019 1253   GFRAA >60 05/28/2018 1520   Lab Results  Component Value Date   HGBA1C 5.8 (H) 06/17/2019   Lab Results  Component Value Date   INSULIN WILL FOLLOW 06/17/2019   CBC    Component Value Date/Time   WBC 6.1 05/28/2018 1520   WBC 4.8 04/25/2018 1106   RBC 3.98 05/28/2018 1520   HGB 12.7 05/28/2018 1520   HCT 37.5 05/28/2018 1520   PLT 228 05/28/2018 1520   MCV 94.2 05/28/2018 1520   MCH 31.9 05/28/2018 1520   MCHC 33.9 05/28/2018 1520   RDW 12.3 05/28/2018 1520   LYMPHSABS 2.0 05/28/2018 1520   MONOABS 0.5 05/28/2018 1520   EOSABS 0.3 05/28/2018 1520   BASOSABS 0.0 05/28/2018 1520   Iron/TIBC/Ferritin/ %Sat No results found for: IRON, TIBC, FERRITIN, IRONPCTSAT Lipid Panel     Component Value Date/Time   CHOL 247 (H) 06/17/2019 1253   TRIG 120 06/17/2019 1253   HDL 47 06/17/2019 1253   LDLCALC 176 (H) 06/17/2019 1253   Hepatic Function Panel     Component Value Date/Time   PROT 7.5 06/17/2019 1253   ALBUMIN 4.8 06/17/2019 1253   AST 38 06/17/2019 1253   AST 37 05/28/2018 1520   ALT 69 (H) 06/17/2019 1253   ALT 85 (H) 05/28/2018 1520   ALKPHOS 93 06/17/2019 1253   BILITOT 0.4 06/17/2019 1253   BILITOT  0.6 05/28/2018 1520      Component Value Date/Time   TSH WILL FOLLOW 06/17/2019 1253   No results found for: Vitamin D, 25-Hydroxy  ECG sinus rhythm with a rate of 81, low voltage in precordial leads most likely related to body habitus, otherwise normal.   INDIRECT CALORIMETER done today shows a VO2 of 277 and a REE of 1929. Her calculated basal metabolic rate is 4496 thus her basal metabolic rate is better than expected.  OBESITY BEHAVIORAL INTERVENTION VISIT  Today's visit was #1  Starting weight: 224 lbs Starting date: 06/17/2019 Today's weight: 224 lbs  Today's date: 06/17/2019 Total lbs lost to date: 0   06/17/2019  Height 5\' 6"  (1.676 m)  Weight 224 lb (101.6 kg)  BMI (Calculated) 36.17  BLOOD PRESSURE - SYSTOLIC 759  BLOOD PRESSURE - DIASTOLIC 77   Body Fat % 16.3 %  Total Body Water (lbs) 81.2 lbs  RMR 1929   ASK: We discussed the diagnosis of obesity with  [name] today and Kennadee agreed to give [place] permission to discuss obesity behavioral modification therapy today.  ASSESS: Sheilia has the diagnosis of obesity and her BMI today is 36.3. Xiara is in the action stage of change.   ADVISE: Alexxus was educated on the multiple health risks of obesity as well as the benefit of weight loss to improve her health. She was advised of the need for long term treatment and the importance of lifestyle modifications to improve her current health and to decrease her risk of future health problems.  AGREE: Multiple dietary modification options and treatment options were discussed and  Kealie agreed to follow the recommendations documented in the above note.  ARRANGE: Rina was educated on the importance of frequent visits to treat obesity as outlined per CMS and USPSTF guidelines and agreed to schedule her next follow up appointment today.  Migdalia Dk, am acting as Location manager for [employer], DO   I have reviewed the above documentation for accuracy and  completeness, and I agree with the above. -Jearld Lesch, DO

## 2019-06-19 ENCOUNTER — Encounter (INDEPENDENT_AMBULATORY_CARE_PROVIDER_SITE_OTHER): Payer: Self-pay

## 2019-06-19 ENCOUNTER — Ambulatory Visit (INDEPENDENT_AMBULATORY_CARE_PROVIDER_SITE_OTHER): Payer: 59 | Admitting: Psychology

## 2019-06-19 ENCOUNTER — Other Ambulatory Visit: Payer: Self-pay

## 2019-06-19 DIAGNOSIS — F3289 Other specified depressive episodes: Secondary | ICD-10-CM | POA: Diagnosis not present

## 2019-06-25 ENCOUNTER — Encounter: Payer: Self-pay | Admitting: Oncology

## 2019-06-25 ENCOUNTER — Encounter (INDEPENDENT_AMBULATORY_CARE_PROVIDER_SITE_OTHER): Payer: Self-pay | Admitting: Bariatrics

## 2019-06-26 ENCOUNTER — Other Ambulatory Visit: Payer: Self-pay

## 2019-06-26 MED ORDER — VENLAFAXINE HCL 37.5 MG PO TABS: 37.5000 mg | ORAL_TABLET | Freq: Every day | ORAL | 3 refills | Status: DC

## 2019-06-26 NOTE — Telephone Encounter (Signed)
Please review

## 2019-06-27 ENCOUNTER — Encounter: Payer: Self-pay | Admitting: *Deleted

## 2019-07-01 ENCOUNTER — Ambulatory Visit: Payer: 59 | Admitting: Oncology

## 2019-07-01 ENCOUNTER — Other Ambulatory Visit: Payer: 59

## 2019-07-01 ENCOUNTER — Ambulatory Visit (INDEPENDENT_AMBULATORY_CARE_PROVIDER_SITE_OTHER): Payer: 59 | Admitting: Bariatrics

## 2019-07-26 DIAGNOSIS — M25571 Pain in right ankle and joints of right foot: Secondary | ICD-10-CM | POA: Diagnosis not present

## 2019-07-26 DIAGNOSIS — G4733 Obstructive sleep apnea (adult) (pediatric): Secondary | ICD-10-CM | POA: Diagnosis not present

## 2019-07-26 DIAGNOSIS — S82851K Displaced trimalleolar fracture of right lower leg, subsequent encounter for closed fracture with nonunion: Secondary | ICD-10-CM | POA: Diagnosis not present

## 2019-08-01 ENCOUNTER — Other Ambulatory Visit: Payer: Self-pay | Admitting: Orthopedic Surgery

## 2019-08-01 DIAGNOSIS — S82851K Displaced trimalleolar fracture of right lower leg, subsequent encounter for closed fracture with nonunion: Secondary | ICD-10-CM

## 2019-08-05 ENCOUNTER — Encounter: Payer: Self-pay | Admitting: Oncology

## 2019-08-06 ENCOUNTER — Other Ambulatory Visit: Payer: Self-pay | Admitting: *Deleted

## 2019-08-06 ENCOUNTER — Encounter: Payer: Self-pay | Admitting: *Deleted

## 2019-08-06 MED ORDER — GABAPENTIN 300 MG PO CAPS: 300.0000 mg | ORAL_CAPSULE | Freq: Every day | ORAL | 3 refills | Status: DC

## 2019-08-09 ENCOUNTER — Ambulatory Visit
Admission: RE | Admit: 2019-08-09 | Discharge: 2019-08-09 | Disposition: A | Discharge status: Home / Self Care | Payer: BC Managed Care – PPO | Source: Ambulatory Visit | Attending: Orthopedic Surgery | Admitting: Orthopedic Surgery

## 2019-08-09 ENCOUNTER — Other Ambulatory Visit: Payer: Self-pay

## 2019-08-09 DIAGNOSIS — S82851K Displaced trimalleolar fracture of right lower leg, subsequent encounter for closed fracture with nonunion: Secondary | ICD-10-CM

## 2019-08-09 DIAGNOSIS — M25571 Pain in right ankle and joints of right foot: Secondary | ICD-10-CM | POA: Diagnosis not present

## 2019-08-09 IMAGING — MG DIGITAL SCREENING BILATERAL MAMMOGRAM WITH TOMO AND CAD
8 series · 8 of 24 positions shown · non-contrast
Comparison: Previous exam(s).

CLINICAL DATA: Screening.

EXAM:
DIGITAL SCREENING BILATERAL MAMMOGRAM WITH TOMO AND CAD

[R CC synth-2D]
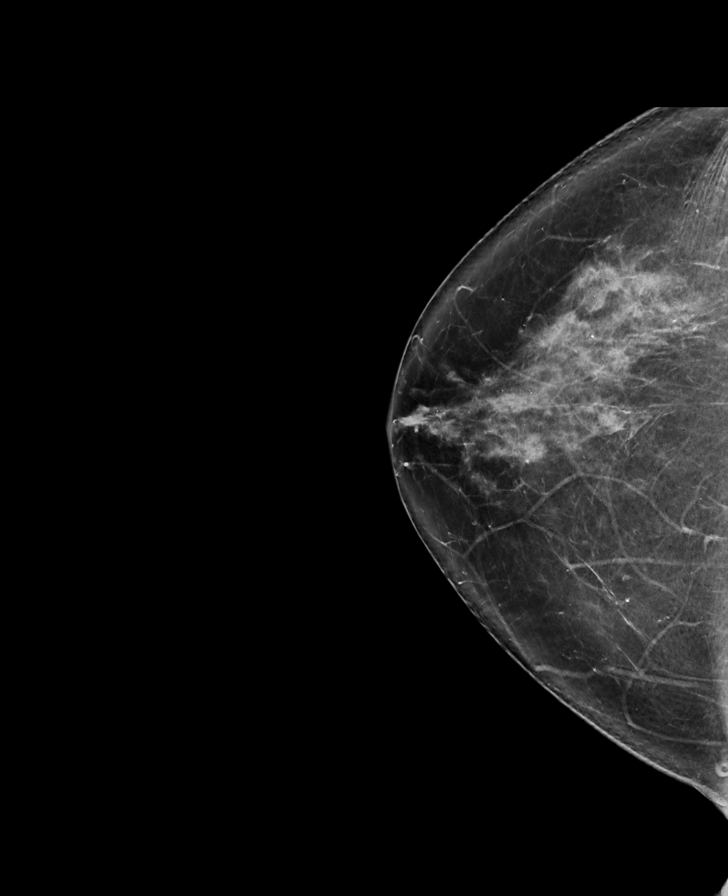

[L CC synth-2D]
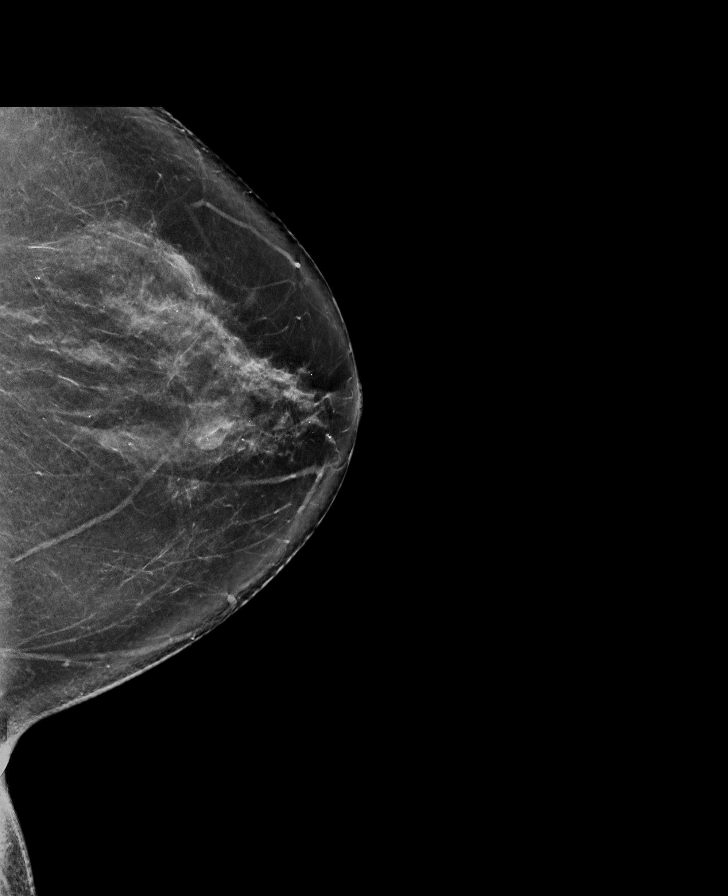

[R MLO synth-2D]
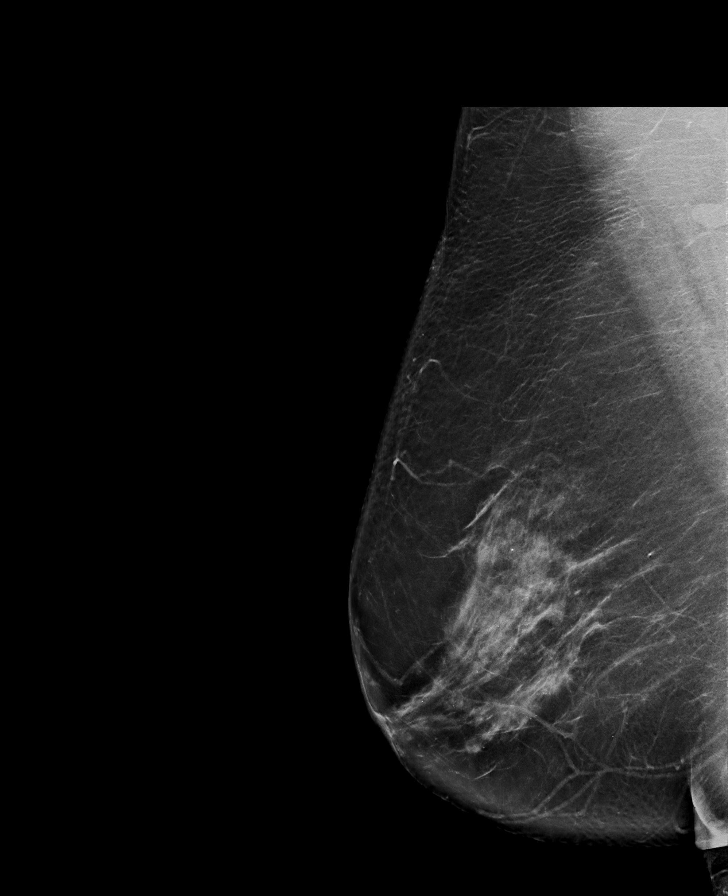

[L MLO synth-2D]
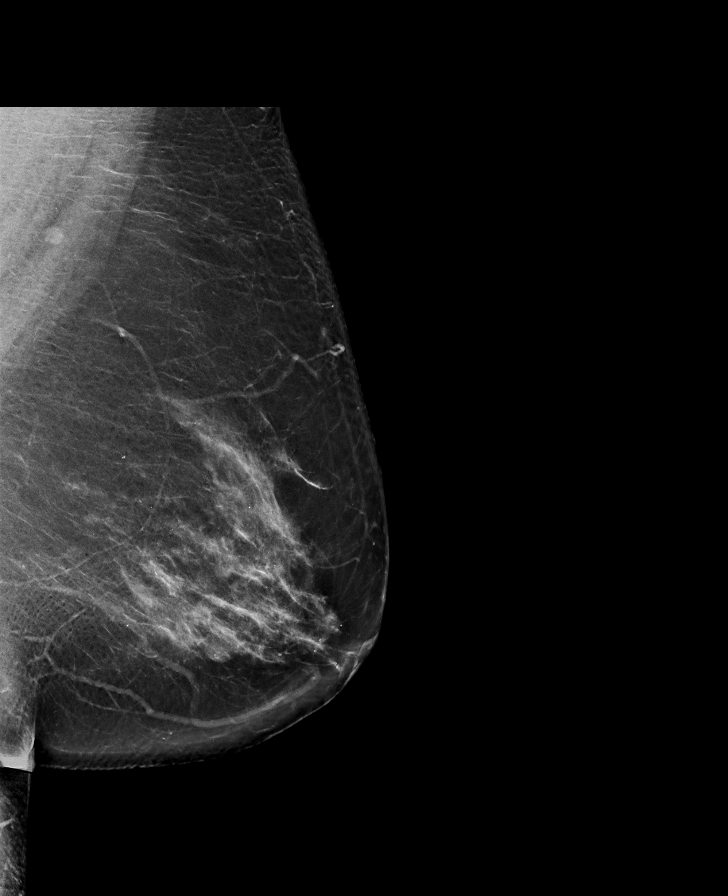

[L CC tomo · tomo slice 45/89.0]
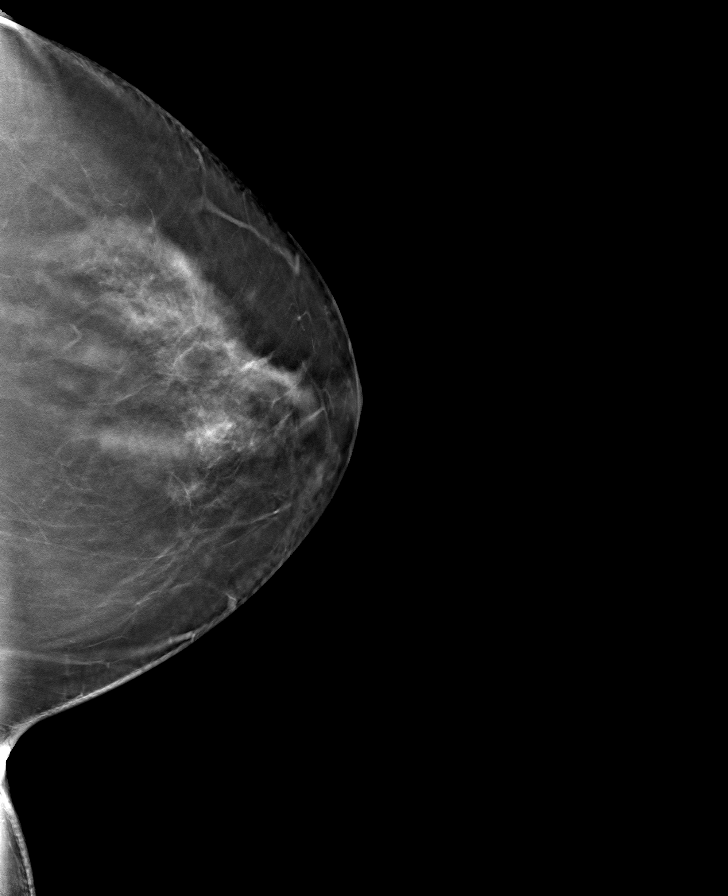

[R CC tomo · tomo slice 43/86.0]
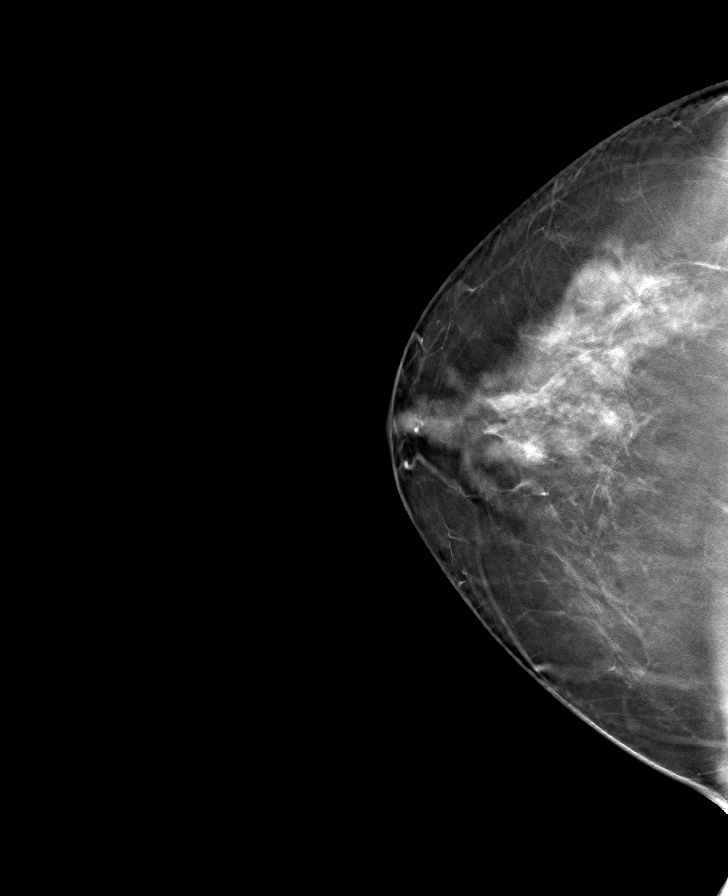

[L MLO tomo · tomo slice 48/95.0]
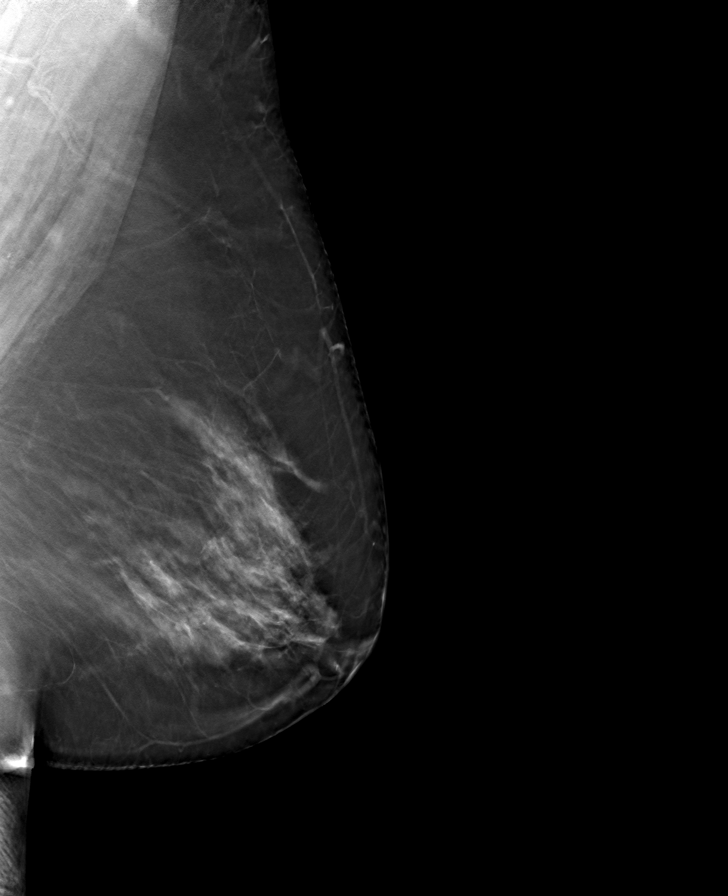

[R MLO tomo · tomo slice 48/95.0]
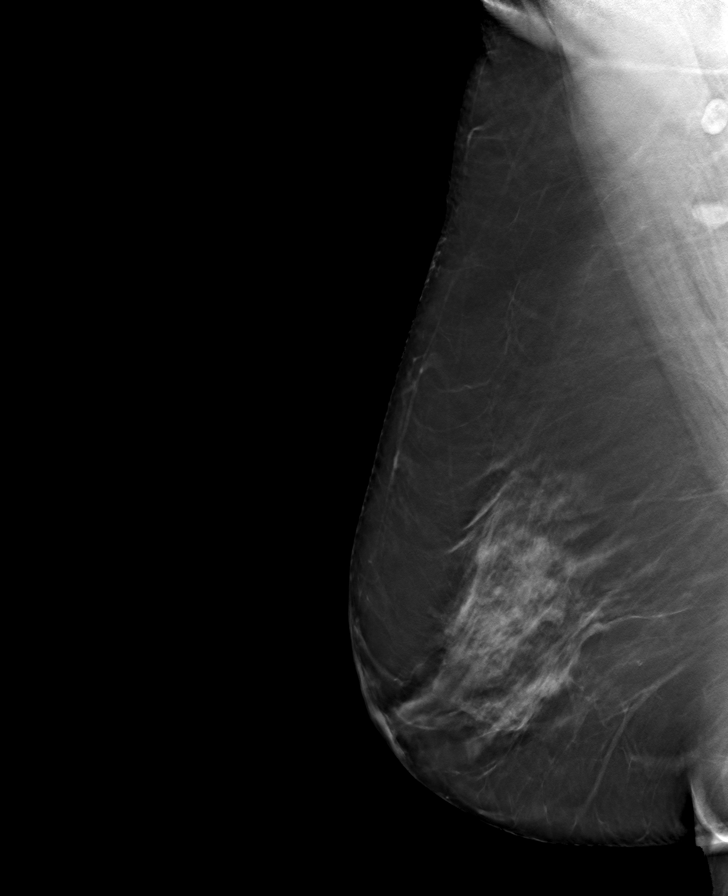

[8 of 24 positions shown; findings below may reference images not displayed]

ACR Breast Density Category c: The breast tissue is heterogeneously
dense, which may obscure small masses.
FINDINGS: In the right breast possible masses requires further evaluation.

In the left breast possible mass requires further evaluation.

Images were processed with CAD.
IMPRESSION: Further evaluation is suggested for possible possible masses in the
right breast.

Further evaluation is suggested for possible possible mass in the
left breast.

RECOMMENDATION:
Diagnostic mammogram and possibly ultrasound of both breasts.
(Code:[id number]-D-55W)

The patient will be contacted regarding the findings, and additional
imaging will be scheduled.

BI-RADS CATEGORY  0: Incomplete. Need additional imaging evaluation
and/or prior mammograms for comparison.

## 2019-08-14 DIAGNOSIS — E7849 Other hyperlipidemia: Secondary | ICD-10-CM | POA: Diagnosis not present

## 2019-08-14 DIAGNOSIS — Z0000 Encounter for general adult medical examination without abnormal findings: Secondary | ICD-10-CM | POA: Diagnosis not present

## 2019-08-14 DIAGNOSIS — R7301 Impaired fasting glucose: Secondary | ICD-10-CM | POA: Diagnosis not present

## 2019-08-14 DIAGNOSIS — M859 Disorder of bone density and structure, unspecified: Secondary | ICD-10-CM | POA: Diagnosis not present

## 2019-08-14 DIAGNOSIS — E038 Other specified hypothyroidism: Secondary | ICD-10-CM | POA: Diagnosis not present

## 2019-08-14 DIAGNOSIS — I10 Essential (primary) hypertension: Secondary | ICD-10-CM | POA: Diagnosis not present

## 2019-08-15 DIAGNOSIS — D224 Melanocytic nevi of scalp and neck: Secondary | ICD-10-CM | POA: Diagnosis not present

## 2019-08-15 DIAGNOSIS — L738 Other specified follicular disorders: Secondary | ICD-10-CM | POA: Diagnosis not present

## 2019-08-15 DIAGNOSIS — Z23 Encounter for immunization: Secondary | ICD-10-CM | POA: Diagnosis not present

## 2019-08-15 DIAGNOSIS — R238 Other skin changes: Secondary | ICD-10-CM | POA: Diagnosis not present

## 2019-08-16 DIAGNOSIS — S82851S Displaced trimalleolar fracture of right lower leg, sequela: Secondary | ICD-10-CM | POA: Diagnosis not present

## 2019-08-21 DIAGNOSIS — E039 Hypothyroidism, unspecified: Secondary | ICD-10-CM | POA: Diagnosis not present

## 2019-08-21 DIAGNOSIS — Z0000 Encounter for general adult medical examination without abnormal findings: Secondary | ICD-10-CM | POA: Diagnosis not present

## 2019-08-21 DIAGNOSIS — R82998 Other abnormal findings in urine: Secondary | ICD-10-CM | POA: Diagnosis not present

## 2019-08-21 DIAGNOSIS — I10 Essential (primary) hypertension: Secondary | ICD-10-CM | POA: Diagnosis not present

## 2019-08-21 DIAGNOSIS — E785 Hyperlipidemia, unspecified: Secondary | ICD-10-CM | POA: Diagnosis not present

## 2019-08-21 DIAGNOSIS — Z1331 Encounter for screening for depression: Secondary | ICD-10-CM | POA: Diagnosis not present

## 2019-08-21 DIAGNOSIS — R7301 Impaired fasting glucose: Secondary | ICD-10-CM | POA: Diagnosis not present

## 2019-08-30 DIAGNOSIS — Z1212 Encounter for screening for malignant neoplasm of rectum: Secondary | ICD-10-CM | POA: Diagnosis not present

## 2019-09-02 DIAGNOSIS — G4733 Obstructive sleep apnea (adult) (pediatric): Secondary | ICD-10-CM | POA: Diagnosis not present

## 2019-09-10 DIAGNOSIS — T8484XA Pain due to internal orthopedic prosthetic devices, implants and grafts, initial encounter: Secondary | ICD-10-CM | POA: Diagnosis not present

## 2019-09-11 ENCOUNTER — Other Ambulatory Visit: Payer: Self-pay

## 2019-09-11 MED ORDER — PANTOPRAZOLE SODIUM 40 MG PO TBEC: 40.0000 mg | DELAYED_RELEASE_TABLET | Freq: Every day | ORAL | 0 refills | Status: DC

## 2019-09-19 DIAGNOSIS — L589 Radiodermatitis, unspecified: Secondary | ICD-10-CM | POA: Diagnosis not present

## 2019-09-25 IMAGING — MG BREAST SURGICAL SPECIMEN
3 series · 3 of 3 positions shown · non-contrast
Comparison: Previous exam(s).

CLINICAL DATA: Evaluate surgical specimen following excision of
RIGHT breast complex sclerosing lesion.

EXAM:
SPECIMEN RADIOGRAPH OF THE RIGHT BREAST

[R (1 of 3)]
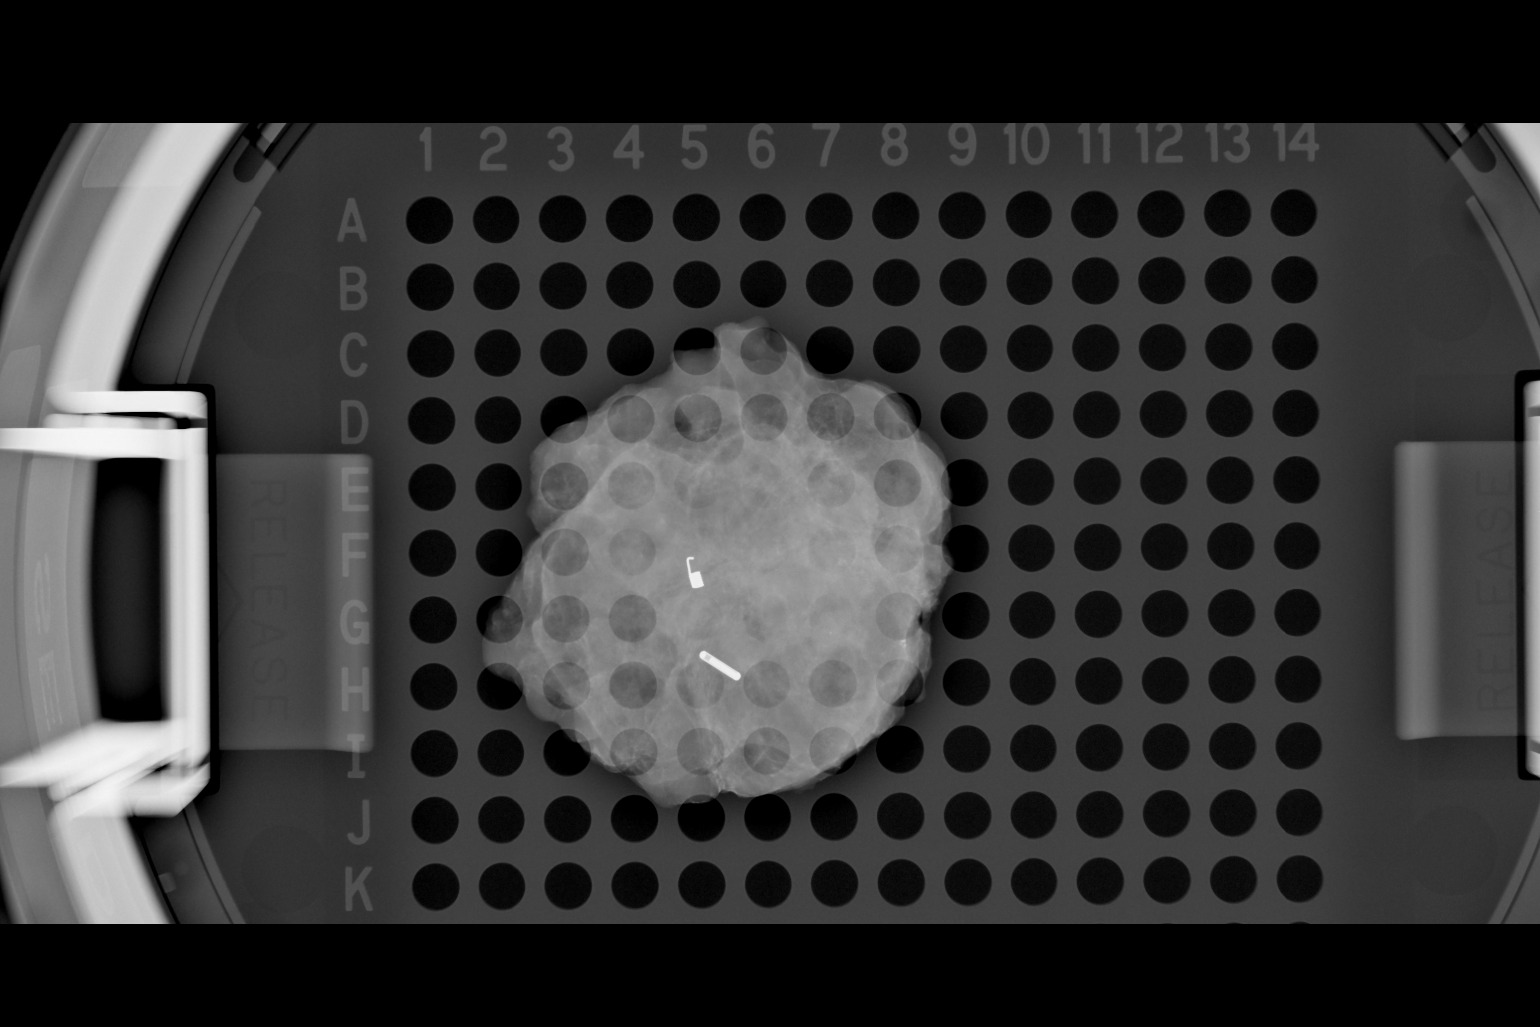

[R (2 of 3)]
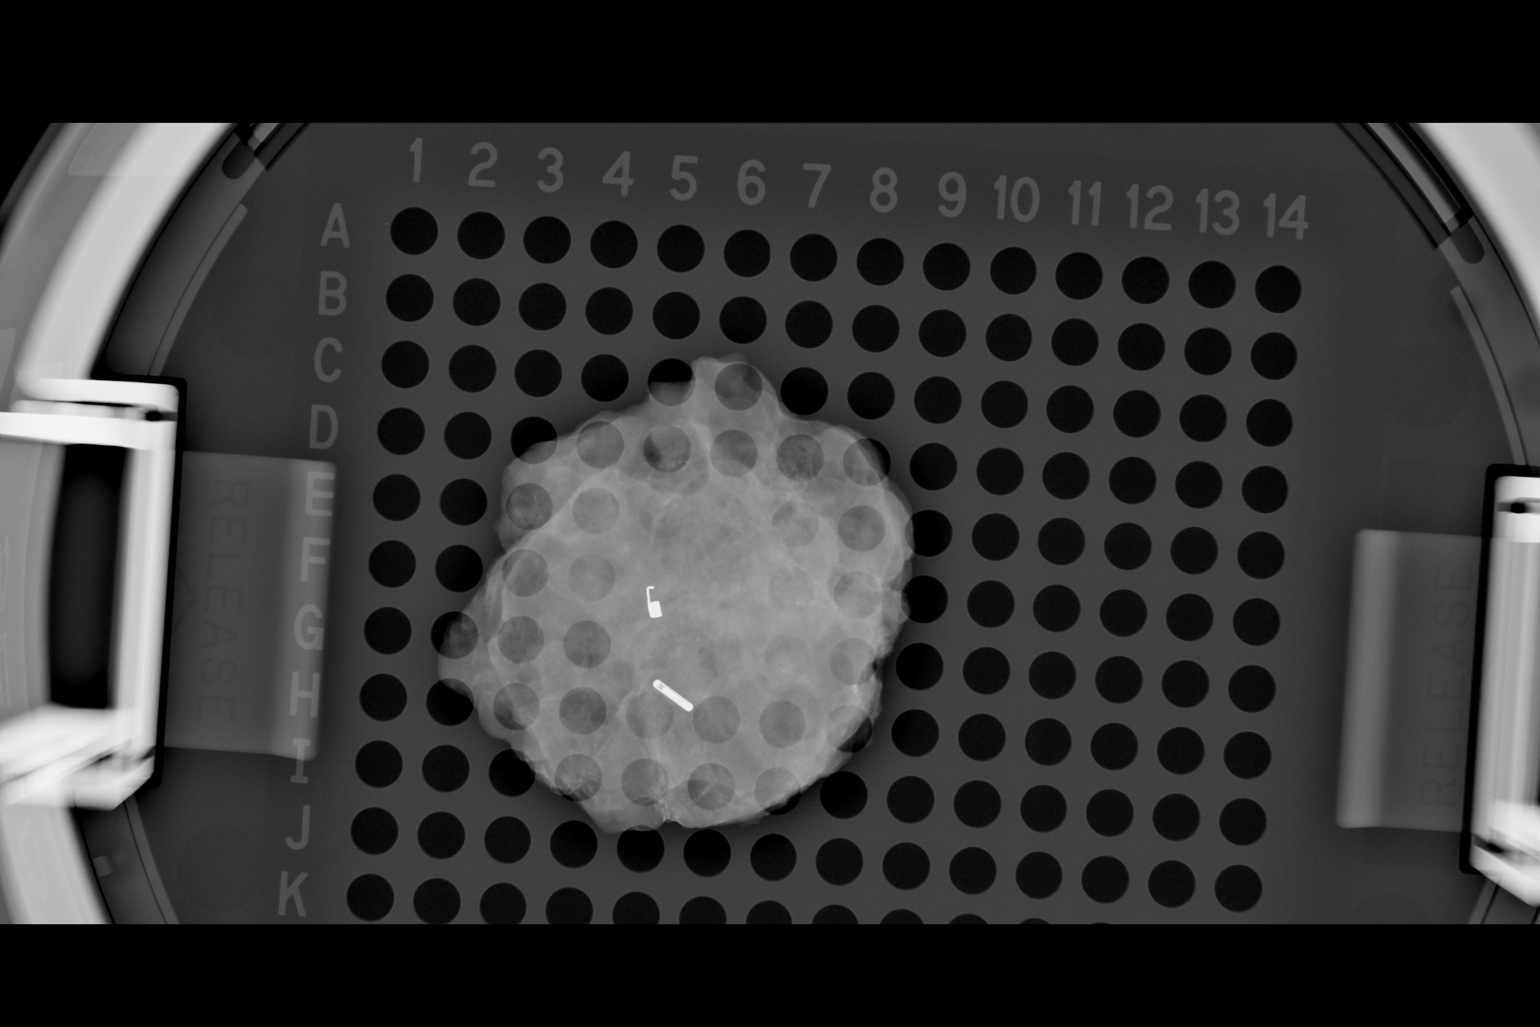

[R (3 of 3)]
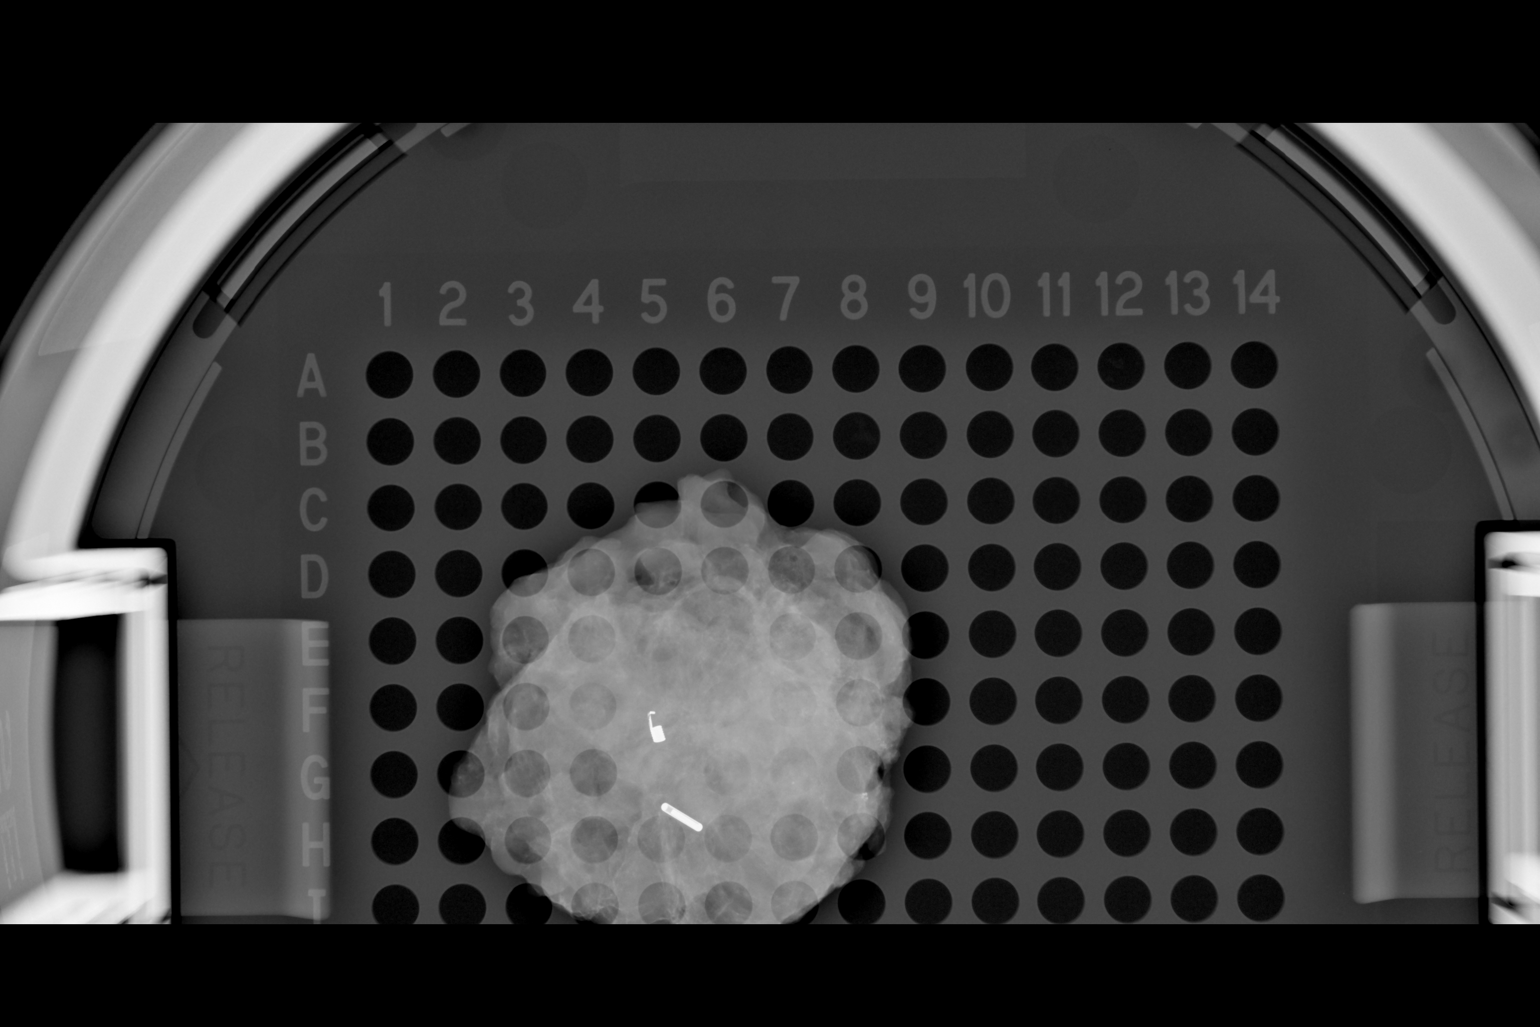

[3 of 3 positions shown; findings below may reference images not displayed]

FINDINGS: Status post excision of the RIGHT breast. The radioactive seed and
biopsy marker clip are present, completely intact, and were marked
for pathology.
IMPRESSION: Specimen radiograph of the RIGHT breast.

## 2019-10-07 ENCOUNTER — Other Ambulatory Visit: Payer: Self-pay | Admitting: *Deleted

## 2019-10-07 DIAGNOSIS — D0511 Intraductal carcinoma in situ of right breast: Secondary | ICD-10-CM

## 2019-10-07 NOTE — Progress Notes (Signed)
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   She continues on anastrozole.  She is working hard to ignore symptoms but she actually does have significant fatigue, aches and pains, and worsening issues from carpal tunnel related to the medication.  Vaginal dryness is not an issue.  She also has hot flashes.  These are no better than before.  Since her last visit, she underwent bone density screening on 06/04/2019. This showed a T-score of -1.4, which is considered osteopenic.  She was started on alendronate by Dr. Manuella Ghazi and is tolerating that well.  She is taking it appropriately.  She also presented to her orthopedist with persistent right ankle pain since surgery in 2019. She underwent right ankle CT on 08/09/2019, which was normal.  Her most recent mammography was in 03/2019 at [facility].  Breast density to be category C, with no evidence of malignancy.  At the time there was some redness and itching involving the inferior aspect of the right areola.  This has been followed closely by Yvonne Wyatt she also saw Dr. Martinique who obtained a biopsy confirming (according to the patient, I cannot access the report) that he was radiation  dermatitis.  Yvonne Wyatt is being treated with a steroid cream and the rash is still itchy it is much improved.   REVIEW OF SYSTEMS: Yvonne Wyatt continues to work full-time.  They are taking appropriate pandemic precautions at work and at home.  She has significant problems with the right ankle because of her hardware there had to be removed.  Yvonne Wyatt does not have a stationary bike but does have an elliptical.  She is planning to start exercising as soon as it is safe for her to do that.  Detailed review of systems today was otherwise stable.   HISTORY OF CURRENT ILLNESS: From the original intake note:  Yvonne Wyatt had routine screening mammography on 03/14/2018 showing a possible abnormality in both breasts. She underwent bilateral diagnostic mammography with tomography and bilateral breast ultrasonography at [facility] on 03/19/2018 showing: breast density category B,  Persistent distortion in the Upper-Outer Quadrant of the Right breast without sonographic correlate. No axillary adenopathy by ultrasound. Numerous bilateral simple breast cysts. Stereotactic guided core biopsy was recommended.  Accordingly on 03/19/2018 she proceeded to biopsy of the right breast area in question. The pathology from this procedure showed [id number]): complex sclerosing lesion with ductal hyperplasia. Fibrocystic changes with usual ductal hyperplasia.   She was then referred to surgery and after appropriate discussion on 04/30/2018 she underwent a right breast lumpectomy, showing extensive ductal carcinoma IN SITU (DCIS), intermediate nuclear grade with extension to multiple foci of sclerosing adenosis and complex sclerosing lesions. Negative for invasive carcinoma. All resection margins are negative for DCIS; closest is the anterior margin at 1.5 mm. Both medial and lateral margins are at 3 mm.  The prognostic  panel showed the estrogen receptor at 100%, with strong staining intensity, estrogen receptor 90% positive,  strong staining intensity. Stage 0 (pTis, pNX)  The patient's subsequent history is as detailed below.   PAST MEDICAL HISTORY: Past Medical History:  Diagnosis Date  . Anxiety   . Breast cancer ([place]) 04/2018   right breast  . Complication of anesthesia   . Constipation   . Depression   . Dyspareunia 05/20/2014   Early menopause age 31  . Ectatic aorta ([place])   . Fatigue   . Fatty liver   . GERD (gastroesophageal reflux disease)   . Headache   . Hemorrhoids   . Heterozygous factor V Leiden mutation ([place]) 05/20/2014   Dx 2007  No personal hx of thrombosis;  mother DVT age 27 on OC  . History of hiatal hernia   . History of radiation therapy 06/11/18- 07/06/18   Right breast- 15 fractions for a total dose of 40.05 Gy, Right breast boost- 5 fractions for a total dose of 10 Gy  . Hypertension   . Hypothyroidism   . Osteopenia   . Plantar fasciitis   . PONV (postoperative nausea and vomiting)   . Sleep apnea   . Vitamin D deficiency     PAST SURGICAL HISTORY: Past Surgical History:  Procedure Laterality Date  . BREAST LUMPECTOMY Right 04/2018  . CESAREAN SECTION    . DIAGNOSTIC LAPAROSCOPY     for endometriosis  . GANGLION CYST EXCISION Right    right wrist  . INGUINAL HERNIA REPAIR     x 2 , at 18 months and 58 years old.   [name] RADIOACTIVE SEED GUIDED EXCISIONAL BREAST BIOPSY Right 04/30/2018   Procedure: RIGHT RADIOACTIVE SEED GUIDED EXCISIONAL BREAST BIOPSY ERAS PATHWAY;  Surgeon: Rolm Bookbinder, MD;  Location: [facility];  Service: General;  Laterality: Right;  . SINUS EXPLORATION  1990    FAMILY HISTORY Family History  Problem Relation Age of Onset  . CAD Mother   . Deep vein thrombosis Mother   . Hyperlipidemia Mother   . COPD Mother   . AAA (abdominal aortic aneurysm) Mother   . Dementia Mother   . High blood pressure Mother   . Kidney disease Mother   . COPD Father   . Alcoholism Father   . CVA Brother   . Diabetes Brother   . Hyperlipidemia Brother    The  patient's father died at the age of 63 from emphysema.  The patient's mother is still living as of July 2019, age 72.  She had a history of superficial clots and some cardiovascular disease.  The patient had one brother who died at age 27 from a stroke.  The patient had 2 sisters.  There is no history of breast cancer ovarian cancer or other clotting history in the family to her knowledge   GYNECOLOGIC HISTORY:  No LMP recorded. Patient is postmenopausal. Menarche: 58 years old Age at first live birth: 58 years old [place] P 1 LMP was at 58 years old Took oral contraceptives for many years (history of endometriosis) HRT no  Hysterectomy? No  BSO? No   SOCIAL HISTORY: (As of July 2019) She is a Marine scientist at [employer].  Her first husband died at 17 at age 58 from [place] in 2002.  Her current husband, Gershon Mussel, builds Medical sales representative for [employer]. Their son Barnabas Lister, 30, is a Engineer, building services. Gershon Mussel has two children from a pervious marriage, Raquel Sarna, who lives in [place], and Providence, lives in [place].  There are no grandchildren.  [place]ola attends a [employer].    ADVANCED DIRECTIVES: The patient's husband is her healthcare power of attorney   HEALTH MAINTENANCE: Social History   Tobacco Use  . Smoking status: Never Smoker  . Smokeless tobacco: Never Used  Substance Use Topics  . Alcohol use: Yes    Comment: occasional  . Drug use: No     Colonoscopy: UTD/ [facility]  PAP: UTD/ [facility]  Bone density: 04/25/2016, t-score of -0.9   Allergies  Allergen Reactions  . Codeine Hives and Rash    Current Outpatient Medications  Medication Sig Dispense Refill  . Acetaminophen (TYLENOL EXTRA STRENGTH PO) Take 1,000 mg by mouth daily as needed (headache/pain).     [name] anastrozole (ARIMIDEX) 1 MG tablet Take 1 tablet (1 mg total) by mouth daily. 90 tablet 4  . aspirin EC 81 MG tablet Take 81 mg by mouth daily.    . Calcium Citrate (CITRACAL PO) Take 2 tablets by mouth daily.     [name] desvenlafaxine (PRISTIQ) 50 MG  24 hr tablet Take 50 mg by mouth daily.    [name] docusate sodium (COLACE) 100 MG capsule Take 100 mg by mouth as needed for mild constipation.    [name] FOLIC ACID PO Take [id number] mcg by mouth daily.     [name] gabapentin (NEURONTIN) 300 MG capsule Take 1 capsule (300 mg total) by mouth at bedtime. May titrate up to 2 at bedtime in needed after 1 week. 60 capsule 3  . Hydrocortisone (PROCTOSOL HC RE) Place 1 application rectally 2 (two) times daily as needed (hemmorrhoids).    [name] levothyroxine (SYNTHROID, LEVOTHROID) 50 MCG tablet Take 1.5 tablets (75 mcg total) by mouth daily before breakfast.    . lisinopril (PRINIVIL,ZESTRIL) 20 MG tablet Take 20 mg by mouth daily.  11  . Lysine 500 MG TABS Take 2 tablets (1,000 mg total) by mouth daily.    . Magnesium 250 MG TABS Take 250 mg by mouth at bedtime.    . Melatonin 3 MG CAPS Take 3 mg by mouth at bedtime.     . Multiple Vitamin (MULTIVITAMIN WITH MINERALS) TABS tablet Take 1 tablet by mouth daily.    [name] nystatin-triamcinolone ointment (MYCOLOG) Apply 1 application topically 2 (two) times daily as needed.    . pantoprazole (PROTONIX) 40 MG tablet Take 1 tablet (40 mg total) by mouth daily. 180 tablet 0  . Probiotic Product (ALIGN PO) Take 1 tablet by mouth daily.     . promethazine (PHENERGAN) 25 MG tablet Take 1 tablet (25 mg total) by mouth every 6 (six) hours as needed for nausea or vomiting. 30 tablet 0  . rosuvastatin (CRESTOR) 10 MG tablet Take 1 tablet (10 mg total) by mouth daily.    . valACYclovir (VALTREX) 1000 MG tablet TAKE 1/2 TABLET DAILY FOR SUPPRESSION  5  . Vitamin D, Ergocalciferol, 2000 units CAPS Take 2,000 Units by mouth daily.      No current facility-administered medications for this visit.     OBJECTIVE: Middle-aged white woman in no acute distress  Vitals:   10/08/19 1017  BP: (!) 114/94  Pulse: 65  Resp: 17  Temp: 98.3 F (36.8 C)  SpO2: 99%     Body mass index is 33.91 kg/m.   Wt Readings from Last 3 Encounters:  10/08/19  210 lb 1.6 oz (95.3 kg)  06/17/19 224 lb (101.6 kg)  05/13/19 231 lb 4.8 oz (104.9 kg)      ECOG  FS:1 - Symptomatic but completely ambulatory  Sclerae unicteric, EOMs intact Wearing a mask No cervical or supraclavicular adenopathy Lungs no rales or rhonchi Heart regular rate and rhythm Abd soft, nontender, positive bowel sounds MSK no focal spinal tenderness, no upper extremity lymphedema Neuro: nonfocal, well oriented, appropriate affect Breasts: Right breast is status post lumpectomy followed by radiation.  The area of erythema around the areola is greatly improved and actually looks completely normal today although it still feels itchy to her.  There is no evidence of disease recurrence.  Left breast is benign.  Both axillae are benign.  Right breast 10/08/2019    05/13/2019 - rash right breast      LAB RESULTS:  CMP     Component Value Date/Time   NA 141 06/17/2019 1253   NA 140 05/02/2006 1027   K 4.4 06/17/2019 1253   K 4.0 05/02/2006 1027   CL 101 06/17/2019 1253   CL 104 05/02/2006 1027   CO2 24 06/17/2019 1253   CO2 22 05/02/2006 1027   GLUCOSE 94 06/17/2019 1253   GLUCOSE 108 (H) 05/28/2018 1520   GLUCOSE 99 05/02/2006 1027   BUN 16 06/17/2019 1253   BUN 12 05/02/2006 1027   CREATININE 1.02 (H) 06/17/2019 1253   CREATININE 1.11 (H) 05/28/2018 1520   CREATININE 0.9 05/02/2006 1027   CALCIUM 9.8 06/17/2019 1253   CALCIUM 9.6 05/02/2006 1027   PROT 7.5 06/17/2019 1253   ALBUMIN 4.8 06/17/2019 1253   AST 38 06/17/2019 1253   AST 37 05/28/2018 1520   ALT 69 (H) 06/17/2019 1253   ALT 85 (H) 05/28/2018 1520   ALKPHOS 93 06/17/2019 1253   BILITOT 0.4 06/17/2019 1253   BILITOT 0.6 05/28/2018 1520   GFRNONAA 62 06/17/2019 1253   GFRNONAA 55 (L) 05/28/2018 1520   GFRAA 71 06/17/2019 1253   GFRAA >60 05/28/2018 1520    No results found for: TOTALPROTELP, ALBUMINELP, A1GS, A2GS, BETS, BETA2SER, GAMS, MSPIKE, SPEI  No results found for: KPAFRELGTCHN,  LAMBDASER, KAPLAMBRATIO  Lab Results  Component Value Date   WBC 5.5 10/08/2019   NEUTROABS 3.3 10/08/2019   HGB 12.4 10/08/2019   HCT 37.6 10/08/2019   MCV 98.9 10/08/2019   PLT 201 10/08/2019    No results found for: LABCA2  No components found for: [id number]  No results for input(s): INR in the last 168 hours.  No results found for: LABCA2  No results found for: [id number]  No results found for: [id number]  No results found for: [id number]  No results found for: CA2729  No components found for: HGQUANT  No results found for: CEA1 / No results found for: CEA1   No results found for: AFPTUMOR  No results found for: CHROMOGRNA  No results found for: PSA1  Appointment on 10/08/2019  Component Date Value Ref Range Status  . WBC Count 10/08/2019 5.5  4.0 - 10.5 K/uL Final  . RBC 10/08/2019 3.80* 3.87 - 5.11 MIL/uL Final  . Hemoglobin 10/08/2019 12.4  12.0 - 15.0 g/dL Final  . HCT 10/08/2019 37.6  36.0 - 46.0 % Final  . MCV 10/08/2019 98.9  80.0 - 100.0 fL Final  . MCH 10/08/2019 32.6  26.0 - 34.0 pg Final  . MCHC 10/08/2019 33.0  30.0 - 36.0 g/dL Final  . RDW 10/08/2019 11.9  11.5 - 15.5 % Final  . Platelet Count 10/08/2019 201  150 - 400 K/uL Final  . nRBC 10/08/2019 0.0  0.0 - 0.2 % Final  .  Neutrophils Relative % 10/08/2019 61  % Final  . Neutro Abs 10/08/2019 3.3  1.7 - 7.7 K/uL Final  . Lymphocytes Relative 10/08/2019 21  % Final  . Lymphs Abs 10/08/2019 1.2  0.7 - 4.0 K/uL Final  . Monocytes Relative 10/08/2019 8  % Final  . Monocytes Absolute 10/08/2019 0.5  0.1 - 1.0 K/uL Final  . Eosinophils Relative 10/08/2019 9  % Final  . Eosinophils Absolute 10/08/2019 0.5  0.0 - 0.5 K/uL Final  . Basophils Relative 10/08/2019 1  % Final  . Basophils Absolute 10/08/2019 0.1  0.0 - 0.1 K/uL Final  . Immature Granulocytes 10/08/2019 0  % Final  . Abs Immature Granulocytes 10/08/2019 0.01  0.00 - 0.07 K/uL Final   Performed at [facility], [place]  [address] [place], [place] [address]    (this displays the last labs from the last 3 days)  No results found for: TOTALPROTELP, ALBUMINELP, A1GS, A2GS, BETS, BETA2SER, GAMS, MSPIKE, SPEI (this displays SPEP labs)  No results found for: KPAFRELGTCHN, LAMBDASER, KAPLAMBRATIO (kappa/lambda light chains)  No results found for: HGBA, HGBA2QUANT, HGBFQUANT, HGBSQUAN (Hemoglobinopathy evaluation)   No results found for: LDH  No results found for: IRON, TIBC, IRONPCTSAT (Iron and TIBC)  No results found for: FERRITIN  Urinalysis No results found for: COLORURINE, APPEARANCEUR, LABSPEC, PHURINE, GLUCOSEU, HGBUR, BILIRUBINUR, KETONESUR, PROTEINUR, UROBILINOGEN, NITRITE, LEUKOCYTESUR   STUDIES: No results found.    ELIGIBLE FOR AVAILABLE RESEARCH PROTOCOL: no  ASSESSMENT: 58 y.o. [place] woman status post right lumpectomy 04/30/2018 for extensive ductal carcinoma in situ, grade 2, estrogen and progesterone receptor positive  (1) adjuvant radiation  06/11/2018 - 07/06/2018 Site/dose:   1. Right breast, 2.67 Gy in 15 fractions for a total dose of 40.05 Gy                      2. Right breast boost, 2 Gy in 5 fractions for a total dose of 10 Gy  (2) anastrozole started 07/24/2018  (a) bone density June 2017 was normal  (b) repeat bone density 06/04/2019 with a T score of -1.4  (c) alendronate started August 2020 Brigitte Pulse)  (d) not a candidate for tamoxifen given Factor V Leiden heterozygosity  (e) anastrozole discontinued November 2020 secondary to side effect  PLAN: Milinda is now a year and a half out from definitive surgery for her noninvasive breast cancer with no evidence of disease activity.  This is very favorable.  The right breast periareolar rash is greatly improved.  It is clearly secondary to her radiation.  Hopefully it will completely resolve with the current treatment she is receiving.  I think the side effects [place] is experiencing from anastrozole are really more than  we want to put up with given the fact that her breast cancer was not invasive.  I am comfortable and in fact recommended that she stop the anastrozole at this point and reassess after the holidays.  If by the middle of January she really feels no better then I would go back on anastrozole and continue that to complete her 5 years.  However if she does feel significantly improved at that time I would stay off anastrozole and simply continue on observation.  She is agreeable to this plan.  She will have her next mammogram mid May and I will see her shortly after that.  From that point we will start yearly as it until she completes her 5 years of follow-up  Knows to  call for any other issue that may develop before the next visit.  , Yvonne Dad, MD  10/08/19 10:31 AM Medical Oncology and Hematology [facility] [place], [place] [address] Tel. [phone]    Fax. [phone]   I, Wilburn Mylar, am acting as scribe for Dr. Virgie Wyatt. .  I, Lurline Del MD, have reviewed the above documentation for accuracy and completeness, and I agree with the above.

## 2019-10-08 ENCOUNTER — Other Ambulatory Visit: Payer: Self-pay

## 2019-10-08 ENCOUNTER — Inpatient Hospital Stay: Payer: BC Managed Care – PPO | Attending: Oncology

## 2019-10-08 ENCOUNTER — Inpatient Hospital Stay (HOSPITAL_BASED_OUTPATIENT_CLINIC_OR_DEPARTMENT_OTHER): Payer: BC Managed Care – PPO | Admitting: Oncology

## 2019-10-08 VITALS — BP 114/94 | HR 65 | Temp 98.3°F | Resp 17 | Ht 66.0 in | Wt 210.1 lb

## 2019-10-08 DIAGNOSIS — Z7982 Long term (current) use of aspirin: Secondary | ICD-10-CM | POA: Insufficient documentation

## 2019-10-08 DIAGNOSIS — Z79810 Long term (current) use of selective estrogen receptor modulators (SERMs): Secondary | ICD-10-CM | POA: Diagnosis not present

## 2019-10-08 DIAGNOSIS — M8580 Other specified disorders of bone density and structure, unspecified site: Secondary | ICD-10-CM | POA: Insufficient documentation

## 2019-10-08 DIAGNOSIS — Z923 Personal history of irradiation: Secondary | ICD-10-CM | POA: Diagnosis not present

## 2019-10-08 DIAGNOSIS — E039 Hypothyroidism, unspecified: Secondary | ICD-10-CM | POA: Insufficient documentation

## 2019-10-08 DIAGNOSIS — D0511 Intraductal carcinoma in situ of right breast: Secondary | ICD-10-CM | POA: Diagnosis not present

## 2019-10-08 DIAGNOSIS — D6851 Activated protein C resistance: Secondary | ICD-10-CM | POA: Insufficient documentation

## 2019-10-08 DIAGNOSIS — Z170 Estrogen receptor positive status [ER+]: Secondary | ICD-10-CM | POA: Insufficient documentation

## 2019-10-08 DIAGNOSIS — I10 Essential (primary) hypertension: Secondary | ICD-10-CM | POA: Insufficient documentation

## 2019-10-08 DIAGNOSIS — N951 Menopausal and female climacteric states: Secondary | ICD-10-CM | POA: Diagnosis not present

## 2019-10-08 LAB — CMP (CANCER CENTER ONLY)
ALT: 40 U/L (ref 0–44)
AST: 25 U/L (ref 15–41)
Albumin: 4.5 g/dL (ref 3.5–5.0)
Alkaline Phosphatase: 80 U/L (ref 38–126)
Anion gap: 13 (ref 5–15)
BUN: 19 mg/dL (ref 6–20)
CO2: 23 mmol/L (ref 22–32)
Calcium: 9.4 mg/dL (ref 8.9–10.3)
Chloride: 104 mmol/L (ref 98–111)
Creatinine: 1.07 mg/dL — ABNORMAL HIGH (ref 0.44–1.00)
GFR, Est AFR Am: >60 mL/min (ref >60)
GFR, Estimated: 58 mL/min — ABNORMAL LOW (ref >60)
Glucose, Bld: 101 mg/dL — ABNORMAL HIGH (ref 70–99)
Potassium: 4.7 mmol/L (ref 3.5–5.1)
Sodium: 140 mmol/L (ref 135–145)
Total Bilirubin: 0.6 mg/dL (ref 0.3–1.2)
Total Protein: 7.8 g/dL (ref 6.5–8.1)

## 2019-10-08 LAB — CBC WITH DIFFERENTIAL (CANCER CENTER ONLY)
Abs Immature Granulocytes: 0.01 10*3/uL (ref 0.00–0.07)
Basophils Absolute: 0.1 10*3/uL (ref 0.0–0.1)
Basophils Relative: 1 %
Eosinophils Absolute: 0.5 10*3/uL (ref 0.0–0.5)
Eosinophils Relative: 9 %
HCT: 37.6 % (ref 36.0–46.0)
Hemoglobin: 12.4 g/dL (ref 12.0–15.0)
Immature Granulocytes: 0 %
Lymphocytes Relative: 21 %
Lymphs Abs: 1.2 10*3/uL (ref 0.7–4.0)
MCH: 32.6 pg (ref 26.0–34.0)
MCHC: 33 g/dL (ref 30.0–36.0)
MCV: 98.9 fL (ref 80.0–100.0)
Monocytes Absolute: 0.5 10*3/uL (ref 0.1–1.0)
Monocytes Relative: 8 %
Neutro Abs: 3.3 10*3/uL (ref 1.7–7.7)
Neutrophils Relative %: 61 %
Platelet Count: 201 10*3/uL (ref 150–400)
RBC: 3.8 MIL/uL — ABNORMAL LOW (ref 3.87–5.11)
RDW: 11.9 % (ref 11.5–15.5)
WBC Count: 5.5 10*3/uL (ref 4.0–10.5)
nRBC: 0 % (ref 0.0–0.2)

## 2019-10-14 ENCOUNTER — Other Ambulatory Visit: Payer: Self-pay

## 2019-10-14 ENCOUNTER — Ambulatory Visit: Payer: BC Managed Care – PPO | Admitting: Family Medicine

## 2019-10-14 ENCOUNTER — Encounter: Payer: Self-pay | Admitting: Family Medicine

## 2019-10-14 ENCOUNTER — Ambulatory Visit: Payer: 59 | Admitting: Neurology

## 2019-10-14 VITALS — BP 122/80 | HR 82 | Temp 97.9°F | Ht 66.25 in | Wt 211.2 lb

## 2019-10-14 DIAGNOSIS — G44209 Tension-type headache, unspecified, not intractable: Secondary | ICD-10-CM | POA: Diagnosis not present

## 2019-10-14 DIAGNOSIS — G4733 Obstructive sleep apnea (adult) (pediatric): Secondary | ICD-10-CM | POA: Diagnosis not present

## 2019-10-14 DIAGNOSIS — Z9989 Dependence on other enabling machines and devices: Secondary | ICD-10-CM | POA: Diagnosis not present

## 2019-10-14 DIAGNOSIS — D0511 Intraductal carcinoma in situ of right breast: Secondary | ICD-10-CM | POA: Diagnosis not present
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
## 2019-10-14 NOTE — Patient Instructions (Addendum)
Continue CPAP nightly and for greater than 4 hours each night.   Monitor headaches for worsening. Consider propranolol for prevention or tizanidine at bedtime for neck tension. May consider MRI in future if not responding to medications.   Follow up closely with PCP and oncology  Follow up with me in 1 year, sooner if needed   Sleep Apnea Sleep apnea affects breathing during sleep. It causes breathing to stop for a short time or to become shallow. It can also increase the risk of:  Heart attack.  Stroke.  Being very overweight (obese).  Diabetes.  Heart failure.  Irregular heartbeat. The goal of treatment is to help you breathe normally again. What are the causes? There are three kinds of sleep apnea:  Obstructive sleep apnea. This is caused by a blocked or collapsed airway.  Central sleep apnea. This happens when the brain does not send the right signals to the muscles that control breathing.  Mixed sleep apnea. This is a combination of obstructive and central sleep apnea. The most common cause of this condition is a collapsed or blocked airway. This can happen if:  Your throat muscles are too relaxed.  Your tongue and tonsils are too large.  You are overweight.  Your airway is too small. What increases the risk?  Being overweight.  Smoking.  Having a small airway.  Being older.  Being female.  Drinking alcohol.  Taking medicines to calm yourself (sedatives or tranquilizers).  Having family members with the condition. What are the signs or symptoms?  Trouble staying asleep.  Being sleepy or tired during the day.  Getting angry a lot.  Loud snoring.  Headaches in the morning.  Not being able to focus your mind (concentrate).  Forgetting things.  Less interest in sex.  Mood swings.  Personality changes.  Feelings of sadness (depression).  Waking up a lot during the night to pee (urinate).  Dry mouth.  Sore throat. How is this  diagnosed?  Your medical history.  A physical exam.  A test that is done when you are sleeping (sleep study). The test is most often done in a sleep lab but may also be done at home. How is this treated?   Sleeping on your side.  Using a medicine to get rid of mucus in your nose (decongestant).  Avoiding the use of alcohol, medicines to help you relax, or certain pain medicines (narcotics).  Losing weight, if needed.  Changing your diet.  Not smoking.  Using a machine to open your airway while you sleep, such as: ? An oral appliance. This is a mouthpiece that shifts your lower jaw forward. ? A CPAP device. This device blows air through a mask when you breathe out (exhale). ? An EPAP device. This has valves that you put in each nostril. ? A BPAP device. This device blows air through a mask when you breathe in (inhale) and breathe out.  Having surgery if other treatments do not work. It is important to get treatment for sleep apnea. Without treatment, it can lead to:  High blood pressure.  Coronary artery disease.  In men, not being able to have an erection (impotence).  Reduced thinking ability. Follow these instructions at home: Lifestyle  Make changes that your doctor recommends.  Eat a healthy diet.  Lose weight if needed.  Avoid alcohol, medicines to help you relax, and some pain medicines.  Do not use any products that contain nicotine or tobacco, such as cigarettes, e-cigarettes, and chewing  tobacco. If you need help quitting, ask your doctor. General instructions  Take over-the-counter and prescription medicines only as told by your doctor.  If you were given a machine to use while you sleep, use it only as told by your doctor.  If you are having surgery, make sure to tell your doctor you have sleep apnea. You may need to bring your device with you.  Keep all follow-up visits as told by your doctor. This is important. Contact a doctor if:  The  machine that you were given to use during sleep bothers you or does not seem to be working.  You do not get better.  You get worse. Get help right away if:  Your chest hurts.  You have trouble breathing in enough air.  You have an uncomfortable feeling in your back, arms, or stomach.  You have trouble talking.  One side of your body feels weak.  A part of your face is hanging down. These symptoms may be an emergency. Do not wait to see if the symptoms will go away. Get medical help right away. Call your local emergency services (911 in the [place] Do not drive yourself to the hospital. Summary  This condition affects breathing during sleep.  The most common cause is a collapsed or blocked airway.  The goal of treatment is to help you breathe normally while you sleep. This information is not intended to replace advice given to you by your health care provider. Make sure you discuss any questions you have with your health care provider. Document Released: 08/16/2008 Document Revised: 08/24/2018 Document Reviewed: 07/03/2018 Elsevier Patient Education  [place].   Tension Headache, Adult A tension headache is pain, pressure, or aching in your head. Tension headaches can last from 30 minutes to several days. Follow these instructions at home: Managing pain  Take over-the-counter and prescription medicines only as told by your doctor.  When you have a headache, lie down in a dark, quiet room.  If told, put ice on your head and neck: ? Put ice in a plastic bag. ? Place a towel between your skin and the bag. ? Leave the ice on for 20 minutes, 2-3 times a day.  If told, put heat on the back of your neck. Do this as often as your doctor tells you to. Use the kind of heat that your doctor recommends, such as a moist heat pack or a heating pad. ? Place a towel between your skin and the heat. ? Leave the heat on for 20-30 minutes. ? Remove the heat if your skin turns  bright red. Eating and drinking  Eat meals on a regular schedule.  Watch how much alcohol you drink: ? If you are a woman and are not pregnant, do not drink more than 1 drink a day. ? If you are a man, do not drink more than 2 drinks a day.  Drink enough fluid to keep your pee (urine) pale yellow.  Do not use a lot of caffeine, or stop using caffeine. Lifestyle  Get enough sleep. Get 7-9 hours of sleep each night. Or get the amount of sleep that your doctor tells you to.  At bedtime, remove all electronic devices from your room. Examples of electronic devices are computers, phones, and tablets.  Find ways to lessen your stress. Some things that can lessen stress are: ? Exercise. ? Deep breathing. ? Yoga. ? Music. ? Positive thoughts.  Sit up straight. Do not tighten (tense)  your muscles.  Do not use any products that have nicotine or tobacco in them, such as cigarettes and e-cigarettes. If you need help quitting, ask your doctor. General instructions   Keep all follow-up visits as told by your doctor. This is important.  Avoid things that can bring on headaches. Keep a journal to find out if certain things bring on headaches. For example, write down: ? What you eat and drink. ? How much sleep you get. ? Any change to your diet or medicines. Contact a doctor if:  Your headache does not get better.  Your headache comes back.  You have a headache and sounds, light, or smells bother you.  You feel sick to your stomach (nauseous) or you throw up (vomit).  Your stomach hurts. Get help right away if:  You suddenly get a very bad headache along with any of these: ? A stiff neck. ? Feeling sick to your stomach. ? Throwing up. ? Feeling weak. ? Trouble seeing. ? Feeling short of breath. ? A rash. ? Feeling unusually sleepy. ? Trouble speaking. ? Pain in your eye or ear. ? Trouble walking or balancing. ? Feeling like you will pass out (faint). ? Passing out.  Summary  A tension headache is pain, pressure, or aching in your head.  Tension headaches can last from 30 minutes to several days.  Lifestyle changes and medicines may help relieve pain. This information is not intended to replace advice given to you by your health care provider. Make sure you discuss any questions you have with your health care provider. Document Released: 02/01/2010 Document Revised: 10/20/2017 Document Reviewed: 02/17/2017 Elsevier Patient Education  [place].    Analgesic Rebound Headache An analgesic rebound headache, sometimes called a medication overuse headache, is a headache that comes after pain medicine (analgesic) taken to treat the original (primary) headache has worn off. Any type of primary headache can return as a rebound headache if a person regularly takes analgesics more than three times a week to treat it. The types of primary headaches that are commonly associated with rebound headaches include:  Migraines.  Headaches that arise from tense muscles in the head and neck area (tension headaches).  Headaches that develop and happen again (recur) on one side of the head and around the eye (cluster headaches). If rebound headaches continue, they become chronic daily headaches. What are the causes? This condition may be caused by frequent use of:  Over-the-counter medicines such as aspirin, ibuprofen, and acetaminophen.  Sinus relief medicines and other medicines that contain caffeine.  Narcotic pain medicines such as codeine and oxycodone. What are the signs or symptoms? The symptoms of a rebound headache are the same as the symptoms of the original headache. Some of the symptoms of specific types of headaches include: Migraine headache  Pulsing or throbbing pain on one or both sides of the head.  Severe pain that interferes with daily activities.  Pain that is worsened by physical activity.  Nausea, vomiting, or both.  Pain with  exposure to bright light, loud noises, or strong smells.  General sensitivity to bright light, loud noises, or strong smells.  Visual changes.  Numbness of one or both arms. Tension headache  Pressure around the head.  Dull, aching head pain.  Pain felt over the front and sides of the head.  Tenderness in the muscles of the head, neck, and shoulders. Cluster headache  Severe pain that begins in or around one eye or temple.  Redness  and tearing in the eye on the same side as the pain.  Droopy or swollen eyelid.  One-sided head pain.  Nausea.  Runny nose.  Sweaty, pale facial skin.  Restlessness. How is this diagnosed? This condition is diagnosed by:  Reviewing your medical history. This includes the nature of your primary headaches.  Reviewing the types of pain medicines that you have been using to treat your headaches and how often you take them. How is this treated? This condition may be treated or managed by:  Discontinuing frequent use of the analgesic medicine. Doing this may worsen your headaches at first, but the pain should eventually become more manageable, less frequent, and less severe.  Seeing a headache specialist. He or she may be able to help you manage your headaches and help make sure there is not another cause of the headaches.  Using methods of stress relief, such as acupuncture, counseling, biofeedback, and massage. Talk with your health care provider about which methods might be good for you. Follow these instructions at home:  Take over-the-counter and prescription medicines only as told by your health care provider.  Stop the repeated use of pain medicine as told by your health care provider. Stopping can be difficult. Carefully follow instructions from your health care provider.  Avoid triggers that are known to cause your primary headaches.  Keep all follow-up visits as told by your health care provider. This is important. Contact a  health care provider if:  You continue to experience headaches after following treatments that your health care provider recommended. Get help right away if:  You develop new headache pain.  You develop headache pain that is different than what you have experienced in the past.  You develop numbness or tingling in your arms or legs.  You develop changes in your speech or vision. This information is not intended to replace advice given to you by your health care provider. Make sure you discuss any questions you have with your health care provider. Document Released: 01/28/2004 Document Revised: 10/20/2017 Document Reviewed: 04/11/2016 Elsevier Patient Education  2020 [employer].

## 2019-10-23 ENCOUNTER — Other Ambulatory Visit: Payer: Self-pay

## 2019-10-23 ENCOUNTER — Encounter: Payer: Self-pay | Admitting: Oncology

## 2019-10-23 MED ORDER — ANASTROZOLE 1 MG PO TABS: 1.0000 mg | ORAL_TABLET | Freq: Every day | ORAL | 11 refills | Status: DC

## 2019-11-26 ENCOUNTER — Other Ambulatory Visit: Payer: Self-pay | Admitting: *Deleted

## 2019-11-26 MED ORDER — GABAPENTIN 300 MG PO CAPS: 300.0000 mg | ORAL_CAPSULE | Freq: Every day | ORAL | 3 refills | Status: DC

## 2019-11-28 ENCOUNTER — Encounter: Payer: Self-pay | Admitting: Oncology

## 2019-12-04 DIAGNOSIS — G4733 Obstructive sleep apnea (adult) (pediatric): Secondary | ICD-10-CM | POA: Diagnosis not present

## 2019-12-04 DIAGNOSIS — H04123 Dry eye syndrome of bilateral lacrimal glands: Secondary | ICD-10-CM | POA: Diagnosis not present

## 2019-12-04 DIAGNOSIS — H5201 Hypermetropia, right eye: Secondary | ICD-10-CM | POA: Diagnosis not present

## 2019-12-04 DIAGNOSIS — H52203 Unspecified astigmatism, bilateral: Secondary | ICD-10-CM | POA: Diagnosis not present

## 2019-12-08 ENCOUNTER — Encounter: Payer: Self-pay | Admitting: Oncology

## 2019-12-08 ENCOUNTER — Other Ambulatory Visit: Payer: Self-pay | Admitting: Oncology

## 2019-12-24 DIAGNOSIS — I10 Essential (primary) hypertension: Secondary | ICD-10-CM | POA: Diagnosis not present

## 2019-12-24 DIAGNOSIS — R1031 Right lower quadrant pain: Secondary | ICD-10-CM | POA: Diagnosis not present

## 2019-12-25 ENCOUNTER — Other Ambulatory Visit: Payer: Self-pay | Admitting: Internal Medicine

## 2019-12-25 ENCOUNTER — Ambulatory Visit
Admission: RE | Admit: 2019-12-25 | Discharge: 2019-12-25 | Disposition: A | Discharge status: Home / Self Care | Payer: BC Managed Care – PPO | Source: Ambulatory Visit | Attending: Internal Medicine | Admitting: Internal Medicine

## 2019-12-25 DIAGNOSIS — R1031 Right lower quadrant pain: Secondary | ICD-10-CM | POA: Diagnosis not present

## 2019-12-25 DIAGNOSIS — R319 Hematuria, unspecified: Secondary | ICD-10-CM | POA: Diagnosis not present

## 2020-01-13 ENCOUNTER — Encounter: Payer: Self-pay | Admitting: Oncology

## 2020-01-13 ENCOUNTER — Other Ambulatory Visit: Payer: Self-pay

## 2020-01-13 MED ORDER — GABAPENTIN 300 MG PO CAPS: 300.0000 mg | ORAL_CAPSULE | Freq: Every day | ORAL | 3 refills | Status: DC

## 2020-02-01 ENCOUNTER — Encounter: Payer: Self-pay | Admitting: Oncology

## 2020-02-03 ENCOUNTER — Other Ambulatory Visit: Payer: Self-pay | Admitting: *Deleted

## 2020-02-03 MED ORDER — GABAPENTIN 300 MG PO CAPS: 300.0000 mg | ORAL_CAPSULE | Freq: Every day | ORAL | 0 refills | Status: DC

## 2020-02-13 DIAGNOSIS — M859 Disorder of bone density and structure, unspecified: Secondary | ICD-10-CM | POA: Diagnosis not present

## 2020-03-04 DIAGNOSIS — G4733 Obstructive sleep apnea (adult) (pediatric): Secondary | ICD-10-CM | POA: Diagnosis not present

## 2020-03-31 DIAGNOSIS — M810 Age-related osteoporosis without current pathological fracture: Secondary | ICD-10-CM | POA: Diagnosis not present

## 2020-04-01 ENCOUNTER — Other Ambulatory Visit: Payer: Self-pay

## 2020-04-01 ENCOUNTER — Ambulatory Visit
Admission: RE | Admit: 2020-04-01 | Discharge: 2020-04-01 | Disposition: A | Discharge status: Home / Self Care | Payer: BC Managed Care – PPO | Source: Ambulatory Visit | Attending: Oncology | Admitting: Oncology

## 2020-04-01 DIAGNOSIS — D0511 Intraductal carcinoma in situ of right breast: Secondary | ICD-10-CM

## 2020-04-01 DIAGNOSIS — R928 Other abnormal and inconclusive findings on diagnostic imaging of breast: Secondary | ICD-10-CM | POA: Diagnosis not present

## 2020-04-01 DIAGNOSIS — D6851 Activated protein C resistance: Secondary | ICD-10-CM

## 2020-04-13 NOTE — Progress Notes (Signed)
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   At her last visit on 10/08/2019, we decided to take Verneta off the anastrozole to see if her symptoms improved. She contacted our office on 10/23/2019 to let us know that she never went off.  She and her husband talked about it and they decided they did not want to take the risk.  Since her last visit, she underwent bilateral diagnostic mammography with tomography at [facility] on 04/01/2020 showing: breast density category B; no evidence of malignancy in either breast.  She also underwent CT abdomen/pelvis on 12/25/2019 for right lower quadrant pain and hematuria. CT scan was negative for malignancy and showed no acute abdominopelvic pathology.  It did show aortic atherosclerosis.[name]   REVIEW OF SYSTEMS: Yvonne Wyatt continues to have arthralgias related to the anastrozole.  They are mostly around her hips.  Of course she does get the gel phenomenon like anyone else.  However exercise does not improve the symptoms as it would if it were simple arthritis.  She has other arthralgias that are less of an issue.  This does not keep her from normal activity and she certainly can take walks which she does for  exercise.  She had both doses of the Pfizer vaccine which she tolerated well.   HISTORY OF CURRENT ILLNESS: From the original intake note:  Yvonne Wyatt had routine screening mammography on 03/14/2018 showing a possible abnormality in both breasts. She underwent bilateral diagnostic mammography with tomography and bilateral breast ultrasonography at [facility] on 03/19/2018 showing: breast density category B,  Persistent distortion in the Upper-Outer Quadrant of the Right breast without sonographic correlate. No axillary adenopathy by ultrasound. Numerous bilateral simple breast cysts. Stereotactic guided core biopsy was recommended.  Accordingly on 03/19/2018 she proceeded to biopsy of the right breast area in question. The pathology from this procedure showed [id number]): complex sclerosing lesion with ductal hyperplasia. Fibrocystic changes with usual ductal hyperplasia.   She was then referred to surgery and after appropriate discussion on 04/30/2018 she underwent a right breast lumpectomy, showing extensive ductal carcinoma IN SITU (DCIS), intermediate nuclear grade with extension to multiple foci of sclerosing adenosis and complex sclerosing lesions. Negative for invasive carcinoma. All resection margins are negative for DCIS; closest is the anterior margin at 1.5 mm. Both medial and lateral margins are at 3 mm.  The prognostic panel showed the estrogen receptor at 100%, with strong staining intensity, estrogen receptor 90% positive, strong staining intensity. Stage 0 (pTis, pNX)  The patient's subsequent history is as detailed below.   PAST MEDICAL HISTORY: Past Medical History:  Diagnosis Date  . Anxiety   . Breast cancer ([place]) 04/2018   right breast  . Complication of anesthesia   . Constipation   . Depression   . Dyspareunia 05/20/2014   Early menopause age 60  . Ectatic aorta ([place])   .  Fatigue   . Fatty liver   . GERD (gastroesophageal reflux disease)   . Headache    . Hemorrhoids   . Heterozygous factor V Leiden mutation ([place]) 05/20/2014   Dx 2007  No personal hx of thrombosis;  mother DVT age 26 on OC  . History of hiatal hernia   . History of radiation therapy 06/11/18- 07/06/18   Right breast- 15 fractions for a total dose of 40.05 Gy, Right breast boost- 5 fractions for a total dose of 10 Gy  . Hypertension   . Hypothyroidism   . Osteopenia   . Personal history of radiation therapy 2019  . Plantar fasciitis   . PONV (postoperative nausea and vomiting)   . Sleep apnea   . Vitamin D deficiency     PAST SURGICAL HISTORY: Past Surgical History:  Procedure Laterality Date  . BREAST LUMPECTOMY Right 04/2018  . CESAREAN SECTION    . DIAGNOSTIC LAPAROSCOPY     for endometriosis  . GANGLION CYST EXCISION Right    right wrist  . INGUINAL HERNIA REPAIR     x 2 , at 18 months and 59 years old.   [name] RADIOACTIVE SEED GUIDED EXCISIONAL BREAST BIOPSY Right 04/30/2018   Procedure: RIGHT RADIOACTIVE SEED GUIDED EXCISIONAL BREAST BIOPSY ERAS PATHWAY;  Surgeon: Rolm Bookbinder, MD;  Location: [facility];  Service: General;  Laterality: Right;  . SINUS EXPLORATION  1990    FAMILY HISTORY Family History  Problem Relation Age of Onset  . CAD Mother   . Deep vein thrombosis Mother   . Hyperlipidemia Mother   . COPD Mother   . AAA (abdominal aortic aneurysm) Mother   . Dementia Mother   . High blood pressure Mother   . Kidney disease Mother   . COPD Father   . Alcoholism Father   . CVA Brother   . Diabetes Brother   . Hyperlipidemia Brother    The patient's father died at the age of 31 from emphysema.  The patient's mother is still living as of July 2019, age 55.  She had a history of superficial clots and some cardiovascular disease.  The patient had one brother who died at age 68 from a stroke.  The patient had 2 sisters.  There is no history of breast cancer ovarian cancer or other clotting history in the family to her knowledge   GYNECOLOGIC  HISTORY:  No LMP recorded. Patient is postmenopausal. Menarche: 59 years old Age at first live birth: 59 years old [place] P 1 LMP was at 59 years old Took oral contraceptives for many years (history of endometriosis) HRT no  Hysterectomy? No  BSO? No   SOCIAL HISTORY: (As of July 2019) She is a Marine scientist at [employer].  Her first husband died at 68 at age 86 from [place] in 2002.  Her current husband, Gershon Mussel, builds Medical sales representative for [employer]. Their son Barnabas Lister, 30, is a Engineer, building services. Gershon Mussel has two children from a pervious marriage, Raquel Sarna, who lives in [place], and Tupelo, lives in [place].  There are no grandchildren.  Jaylinn attends a [employer].    ADVANCED DIRECTIVES: The patient's husband is her healthcare power of attorney   HEALTH MAINTENANCE: Social History   Tobacco Use  . Smoking status: Never Smoker  . Smokeless tobacco: Never Used  Substance Use Topics  . Alcohol use: Yes    Comment: occasional  . Drug use: No     Colonoscopy: UTD/ [facility]  PAP: UTD/ [facility]  Bone density:  04/25/2016, t-score of -0.9   Allergies  Allergen Reactions  . Cat Hair Extract Itching and Other (See Comments)  . Codeine Hives and Rash    Current Outpatient Medications  Medication Sig Dispense Refill  . Acetaminophen (TYLENOL EXTRA STRENGTH PO) Take 1,000 mg by mouth daily as needed (headache/pain).     [name] alendronate (FOSAMAX) 70 MG tablet Take 1 tablet (70 mg total) by mouth once a week. Take with a full glass of water on an empty stomach.    [name] anastrozole (ARIMIDEX) 1 MG tablet Take 1 tablet (1 mg total) by mouth daily. 30 tablet 11  . aspirin EC 81 MG tablet Take 81 mg by mouth daily.    . Calcium Citrate (CITRACAL PO) Take 2 tablets by mouth daily.     [name] desvenlafaxine (PRISTIQ) 50 MG 24 hr tablet Take 50 mg by mouth daily.    [name] docusate sodium (COLACE) 100 MG capsule Take 100 mg by mouth as needed for mild constipation.    [name] FOLIC ACID PO Take [id number] mcg by mouth daily.     [name] gabapentin  (NEURONTIN) 300 MG capsule Take 1 capsule (300 mg total) by mouth at bedtime. May titrate up to 2 at bedtime in needed after 1 week. 180 capsule 0  . Hydrocortisone (PROCTOSOL HC RE) Place 1 application rectally 2 (two) times daily as needed (hemmorrhoids).    [name] levothyroxine (SYNTHROID, LEVOTHROID) 50 MCG tablet Take 1.5 tablets (75 mcg total) by mouth daily before breakfast.    . lisinopril (PRINIVIL,ZESTRIL) 20 MG tablet Take 20 mg by mouth daily.  11  . Lysine 500 MG TABS Take 2 tablets (1,000 mg total) by mouth daily.    . Magnesium 250 MG TABS Take 250 mg by mouth at bedtime.    . Melatonin 3 MG CAPS Take 3 mg by mouth at bedtime.     . Multiple Vitamin (MULTIVITAMIN WITH MINERALS) TABS tablet Take 1 tablet by mouth daily.    [name] nystatin-triamcinolone ointment (MYCOLOG) Apply 1 application topically 2 (two) times daily as needed.    . pantoprazole (PROTONIX) 40 MG tablet TAKE 1 TABLET BY MOUTH DAILY 180 tablet 0  . Probiotic Product (ALIGN PO) Take 1 tablet by mouth daily.     . promethazine (PHENERGAN) 25 MG tablet Take 1 tablet (25 mg total) by mouth every 6 (six) hours as needed for nausea or vomiting. 30 tablet 0  . rosuvastatin (CRESTOR) 10 MG tablet Take 1 tablet (10 mg total) by mouth daily.    . valACYclovir (VALTREX) 1000 MG tablet TAKE 1/2 TABLET DAILY FOR SUPPRESSION  5  . Vitamin D, Ergocalciferol, 2000 units CAPS Take 2,000 Units by mouth daily.      No current facility-administered medications for this visit.    OBJECTIVE: white woman in no acute distress  Vitals:   04/14/20 1131  BP: 134/77  Pulse: 78  Resp: 20  Temp: 98.7 F (37.1 C)     Body mass index is 36.31 kg/m.   Wt Readings from Last 3 Encounters:  04/14/20 226 lb 11.2 oz (102.8 kg)  10/14/19 211 lb 3.2 oz (95.8 kg)  10/08/19 210 lb 1.6 oz (95.3 kg)      ECOG FS:1 - Symptomatic but completely ambulatory  Sclerae unicteric, EOMs intact Wearing a mask No cervical or supraclavicular adenopathy Lungs  no rales or rhonchi Heart regular rate and rhythm Abd soft, nontender, positive bowel sounds MSK no focal spinal tenderness, no upper extremity lymphedema Neuro:  nonfocal, well oriented, appropriate affect Breasts: The right breast has undergone lumpectomy and radiation.  There is no evidence of local recurrence.  The left breast is benign.  Both axillae are benign.   LAB RESULTS:  CMP     Component Value Date/Time   NA 140 10/08/2019 0946   NA 141 06/17/2019 1253   NA 140 05/02/2006 1027   K 4.7 10/08/2019 0946   K 4.0 05/02/2006 1027   CL 104 10/08/2019 0946   CL 104 05/02/2006 1027   CO2 23 10/08/2019 0946   CO2 22 05/02/2006 1027   GLUCOSE 101 (H) 10/08/2019 0946   GLUCOSE 99 05/02/2006 1027   BUN 19 10/08/2019 0946   BUN 16 06/17/2019 1253   BUN 12 05/02/2006 1027   CREATININE 1.07 (H) 10/08/2019 0946   CREATININE 0.9 05/02/2006 1027   CALCIUM 9.4 10/08/2019 0946   CALCIUM 9.6 05/02/2006 1027   PROT 7.8 10/08/2019 0946   PROT 7.5 06/17/2019 1253   ALBUMIN 4.5 10/08/2019 0946   ALBUMIN 4.8 06/17/2019 1253   AST 25 10/08/2019 0946   ALT 40 10/08/2019 0946   ALKPHOS 80 10/08/2019 0946   BILITOT 0.6 10/08/2019 0946   GFRNONAA 58 (L) 10/08/2019 0946   GFRAA >60 10/08/2019 0946    No results found for: TOTALPROTELP, ALBUMINELP, A1GS, A2GS, BETS, BETA2SER, GAMS, MSPIKE, SPEI  No results found for: KPAFRELGTCHN, LAMBDASER, KAPLAMBRATIO  Lab Results  Component Value Date   WBC 5.5 10/08/2019   NEUTROABS 3.3 10/08/2019   HGB 12.4 10/08/2019   HCT 37.6 10/08/2019   MCV 98.9 10/08/2019   PLT 201 10/08/2019    No results found for: LABCA2  No components found for: [id number]  No results for input(s): INR in the last 168 hours.  No results found for: LABCA2  No results found for: [id number]  No results found for: [id number]  No results found for: [id number]  No results found for: CA2729  No components found for: HGQUANT  No results found for: CEA1 / No results  found for: CEA1   No results found for: AFPTUMOR  No results found for: CHROMOGRNA  No results found for: HGBA, HGBA2QUANT, HGBFQUANT, HGBSQUAN (Hemoglobinopathy evaluation)   No results found for: LDH  No results found for: IRON, TIBC, IRONPCTSAT (Iron and TIBC)  No results found for: FERRITIN  Urinalysis No results found for: COLORURINE, APPEARANCEUR, LABSPEC, PHURINE, GLUCOSEU, HGBUR, BILIRUBINUR, KETONESUR, PROTEINUR, UROBILINOGEN, NITRITE, LEUKOCYTESUR   STUDIES: MM DIAG BREAST TOMO BILATERAL  Result Date: 04/01/2020 CLINICAL DATA:  RIGHT lumpectomy with radiation therapy in 2018. EXAM: DIGITAL DIAGNOSTIC BILATERAL MAMMOGRAM WITH CAD AND TOMO COMPARISON:  04/01/2019 and ACR Breast Density Category b: There are scattered areas of fibroglandular density. FINDINGS: Post operative changes are seen in the RIGHTbreast. No suspicious mass, distortion, or microcalcifications are identified to suggest presence of malignancy. Spot tangential view of the lumpectomy site is unremarkable. Mammographic images were processed with CAD. IMPRESSION: No mammographic evidence for malignancy. RECOMMENDATION: Diagnostic mammogram is suggested in 1 year. (Code:[id number]-01Y) I have discussed the findings and recommendations with the patient. If applicable, a reminder letter will be sent to the patient regarding the next appointment. BI-RADS CATEGORY  2: Benign. Electronically Signed   By: Nolon Nations M.D.   On: 04/01/2020 08:28      ELIGIBLE FOR AVAILABLE RESEARCH PROTOCOL: no  ASSESSMENT: 59 y.o. [place] woman status post right lumpectomy 04/30/2018 for extensive ductal carcinoma in situ, grade 2, estrogen and progesterone receptor positive  (1) adjuvant radiation  06/11/2018 - 07/06/2018 Site/dose:   1. Right breast, 2.67 Gy in 15 fractions for a total dose of 40.05 Gy                      2. Right breast boost, 2 Gy in 5 fractions for a total dose of 10 Gy  (2) anastrozole started  07/24/2018  (a) bone density June 2017 was normal  (b) repeat bone density 06/04/2019 with a T score of -1.4  (c) alendronate started August 2020 Brigitte Pulse)  (d) not a candidate for tamoxifen given Factor V Leiden heterozygosity  (e) repeat bone density to be repeated July 2022  (f) switching to exemestane May 2021  (3) heterozygous factor V Leiden mutation   PLAN: Ader is now just about 2 years out from definitive surgery for her breast cancer with no evidence of disease recurrence.  This is very favorable.  She continues to have issues with anastrozole.  She is reluctant to discontinue it for a few months to see if it would make a difference.  Instead we are going to try to make a lateral passing go to exemestane.  We discussed the possible toxicities side effects and complications of this agent.  The concern of course is cost.  It is not possible to tell in advance whether this medicine will be much more expensive or the same cost as anastrozole.  The only way to find out is to put in the prescription which I have done.  I also gave her a written prescription in case the cost at her usual pharmacy is very high.  That way she could "shop it around" at other pharmacies.  Otherwise I am delighted at how well she is doing.  She will see me again in a year, after her next mammography  She knows to call for any other issue that may develop before then  Total encounter time 30 minutes.*  Arthur Speagle, Virgie Dad, MD  04/14/20 11:48 AM Medical Oncology and Hematology [facility] [place], [place] [address] Tel. [phone]    Fax. [phone]   I, Wilburn Mylar, am acting as scribe for Dr. Virgie Dad. Hania Cerone.  I, Lurline Del MD, have reviewed the above documentation for accuracy and completeness, and I agree with the above.   *Total Encounter Time as defined by the Centers for Medicare and Medicaid Services includes, in addition to the face-to-face time of  a patient visit (documented in the note above) non-face-to-face time: obtaining and reviewing outside history, ordering and reviewing medications, tests or procedures, care coordination (communications with other health care professionals or caregivers) and documentation in the medical record.

## 2020-04-14 ENCOUNTER — Other Ambulatory Visit: Payer: Self-pay

## 2020-04-14 ENCOUNTER — Inpatient Hospital Stay: Payer: BC Managed Care – PPO | Attending: Oncology | Admitting: Oncology

## 2020-04-14 DIAGNOSIS — Z923 Personal history of irradiation: Secondary | ICD-10-CM | POA: Diagnosis not present

## 2020-04-14 DIAGNOSIS — Z8349 Family history of other endocrine, nutritional and metabolic diseases: Secondary | ICD-10-CM | POA: Diagnosis not present

## 2020-04-14 DIAGNOSIS — D0511 Intraductal carcinoma in situ of right breast: Secondary | ICD-10-CM | POA: Insufficient documentation

## 2020-04-14 DIAGNOSIS — Z170 Estrogen receptor positive status [ER+]: Secondary | ICD-10-CM | POA: Diagnosis not present

## 2020-04-14 DIAGNOSIS — I10 Essential (primary) hypertension: Secondary | ICD-10-CM | POA: Diagnosis not present

## 2020-04-14 DIAGNOSIS — E039 Hypothyroidism, unspecified: Secondary | ICD-10-CM | POA: Diagnosis not present

## 2020-04-14 DIAGNOSIS — E119 Type 2 diabetes mellitus without complications: Secondary | ICD-10-CM | POA: Insufficient documentation

## 2020-04-14 DIAGNOSIS — Z79899 Other long term (current) drug therapy: Secondary | ICD-10-CM | POA: Diagnosis not present

## 2020-04-14 DIAGNOSIS — Z8249 Family history of ischemic heart disease and other diseases of the circulatory system: Secondary | ICD-10-CM | POA: Insufficient documentation

## 2020-04-14 DIAGNOSIS — Z6372 Alcoholism and drug addiction in family: Secondary | ICD-10-CM | POA: Insufficient documentation

## 2020-04-14 DIAGNOSIS — Z79811 Long term (current) use of aromatase inhibitors: Secondary | ICD-10-CM | POA: Insufficient documentation

## 2020-04-14 DIAGNOSIS — G4730 Sleep apnea, unspecified: Secondary | ICD-10-CM | POA: Insufficient documentation

## 2020-04-14 DIAGNOSIS — D6851 Activated protein C resistance: Secondary | ICD-10-CM | POA: Diagnosis not present

## 2020-04-14 DIAGNOSIS — K219 Gastro-esophageal reflux disease without esophagitis: Secondary | ICD-10-CM | POA: Diagnosis not present

## 2020-04-14 DIAGNOSIS — Z7982 Long term (current) use of aspirin: Secondary | ICD-10-CM | POA: Diagnosis not present

## 2020-04-14 DIAGNOSIS — I700 Atherosclerosis of aorta: Secondary | ICD-10-CM | POA: Diagnosis not present

## 2020-04-14 DIAGNOSIS — Z833 Family history of diabetes mellitus: Secondary | ICD-10-CM | POA: Diagnosis not present

## 2020-04-14 MED ORDER — EXEMESTANE 25 MG PO TABS
25.0000 mg | ORAL_TABLET | Freq: Every day | ORAL | 4 refills | Status: DC
Start: 2020-04-14 — End: 2020-11-05

## 2020-04-15 ENCOUNTER — Telehealth: Payer: Self-pay | Admitting: Oncology

## 2020-04-15 NOTE — Telephone Encounter (Signed)
Scheduled appts per 5/25 los. Pt confirmed appt date and time.

## 2020-04-29 ENCOUNTER — Encounter: Payer: Self-pay | Admitting: Oncology

## 2020-04-29 DIAGNOSIS — M859 Disorder of bone density and structure, unspecified: Secondary | ICD-10-CM | POA: Diagnosis not present

## 2020-05-21 ENCOUNTER — Other Ambulatory Visit: Payer: Self-pay | Admitting: *Deleted

## 2020-05-21 MED ORDER — PANTOPRAZOLE SODIUM 40 MG PO TBEC: 40.0000 mg | DELAYED_RELEASE_TABLET | Freq: Every day | ORAL | 0 refills | Status: DC

## 2020-05-29 DIAGNOSIS — Z1151 Encounter for screening for human papillomavirus (HPV): Secondary | ICD-10-CM | POA: Diagnosis not present

## 2020-05-29 DIAGNOSIS — Z01419 Encounter for gynecological examination (general) (routine) without abnormal findings: Secondary | ICD-10-CM | POA: Diagnosis not present

## 2020-05-29 DIAGNOSIS — Z6836 Body mass index (BMI) 36.0-36.9, adult: Secondary | ICD-10-CM | POA: Diagnosis not present

## 2020-08-21 DIAGNOSIS — L581 Chronic radiodermatitis: Secondary | ICD-10-CM | POA: Diagnosis not present

## 2020-08-21 DIAGNOSIS — G4733 Obstructive sleep apnea (adult) (pediatric): Secondary | ICD-10-CM | POA: Diagnosis not present

## 2020-08-21 DIAGNOSIS — Z23 Encounter for immunization: Secondary | ICD-10-CM | POA: Diagnosis not present

## 2020-08-21 DIAGNOSIS — D224 Melanocytic nevi of scalp and neck: Secondary | ICD-10-CM | POA: Diagnosis not present

## 2020-08-21 DIAGNOSIS — L820 Inflamed seborrheic keratosis: Secondary | ICD-10-CM | POA: Diagnosis not present

## 2020-08-21 DIAGNOSIS — L738 Other specified follicular disorders: Secondary | ICD-10-CM | POA: Diagnosis not present

## 2020-08-26 DIAGNOSIS — R7301 Impaired fasting glucose: Secondary | ICD-10-CM | POA: Diagnosis not present

## 2020-08-26 DIAGNOSIS — E039 Hypothyroidism, unspecified: Secondary | ICD-10-CM | POA: Diagnosis not present

## 2020-08-26 DIAGNOSIS — E785 Hyperlipidemia, unspecified: Secondary | ICD-10-CM | POA: Diagnosis not present

## 2020-08-26 DIAGNOSIS — M859 Disorder of bone density and structure, unspecified: Secondary | ICD-10-CM | POA: Diagnosis not present

## 2020-08-26 DIAGNOSIS — Z0000 Encounter for general adult medical examination without abnormal findings: Secondary | ICD-10-CM | POA: Diagnosis not present

## 2020-08-27 ENCOUNTER — Other Ambulatory Visit: Payer: Self-pay | Admitting: *Deleted

## 2020-08-27 MED ORDER — GABAPENTIN 300 MG PO CAPS: 300.0000 mg | ORAL_CAPSULE | Freq: Every day | ORAL | 0 refills | Status: DC

## 2020-09-03 DIAGNOSIS — M791 Myalgia, unspecified site: Secondary | ICD-10-CM | POA: Diagnosis not present

## 2020-09-03 DIAGNOSIS — Z23 Encounter for immunization: Secondary | ICD-10-CM | POA: Diagnosis not present

## 2020-09-03 DIAGNOSIS — Z0000 Encounter for general adult medical examination without abnormal findings: Secondary | ICD-10-CM | POA: Diagnosis not present

## 2020-09-03 DIAGNOSIS — Z1331 Encounter for screening for depression: Secondary | ICD-10-CM | POA: Diagnosis not present

## 2020-09-03 DIAGNOSIS — Z1389 Encounter for screening for other disorder: Secondary | ICD-10-CM | POA: Diagnosis not present

## 2020-09-03 DIAGNOSIS — I10 Essential (primary) hypertension: Secondary | ICD-10-CM | POA: Diagnosis not present

## 2020-10-13 ENCOUNTER — Ambulatory Visit: Payer: Self-pay | Admitting: Family Medicine

## 2020-10-22 DIAGNOSIS — M810 Age-related osteoporosis without current pathological fracture: Secondary | ICD-10-CM | POA: Diagnosis not present

## 2020-10-23 ENCOUNTER — Other Ambulatory Visit: Payer: Self-pay | Admitting: Oncology

## 2020-11-03 DIAGNOSIS — M8580 Other specified disorders of bone density and structure, unspecified site: Secondary | ICD-10-CM | POA: Diagnosis not present

## 2020-11-05 ENCOUNTER — Ambulatory Visit: Payer: BC Managed Care – PPO | Admitting: Family Medicine

## 2020-11-05 ENCOUNTER — Other Ambulatory Visit: Payer: Self-pay

## 2020-11-05 ENCOUNTER — Encounter: Payer: Self-pay | Admitting: Family Medicine

## 2020-11-05 VITALS — BP 124/77 | HR 86 | Ht 66.0 in | Wt 223.0 lb

## 2020-11-05 DIAGNOSIS — G4733 Obstructive sleep apnea (adult) (pediatric): Secondary | ICD-10-CM | POA: Diagnosis not present

## 2020-11-05 DIAGNOSIS — G44209 Tension-type headache, unspecified, not intractable: Secondary | ICD-10-CM

## 2020-11-05 DIAGNOSIS — D0511 Intraductal carcinoma in situ of right breast: Secondary | ICD-10-CM | POA: Diagnosis not present

## 2020-11-05 DIAGNOSIS — Z9989 Dependence on other enabling machines and devices: Secondary | ICD-10-CM | POA: Diagnosis not present
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
## 2020-11-05 NOTE — Patient Instructions (Addendum)
Please continue using your CPAP regularly. While your insurance requires that you use CPAP at least 4 hours each night on 70% of the nights, I recommend, that you not skip any nights and use it throughout the night if you can. Getting used to CPAP and staying with the treatment long term does take time and patience and discipline. Untreated obstructive sleep apnea when it is moderate to severe can have an adverse impact on cardiovascular health and raise her risk for heart disease, arrhythmias, hypertension, congestive heart failure, stroke and diabetes. Untreated obstructive sleep apnea causes sleep disruption, nonrestorative sleep, and sleep deprivation. This can have an impact on your day to day functioning and cause daytime sleepiness and impairment of cognitive function, memory loss, mood disturbance, and problems focussing. Using CPAP regularly can improve these symptoms.   Try increasing magnesium to 400-600mg  daily for headaches. Continue CPAP nightly. I will have DME adjust pressure. If you do not note change in 2 weeks, let me know.   Follow up in 1 year, sooner if needed    Migraine Headache A migraine headache is a very strong throbbing pain on one side or both sides of your head. This type of headache can also cause other symptoms. It can last from 4 hours to 3 days. Talk with your doctor about what things may bring on (trigger) this condition. What are the causes? The exact cause of this condition is not known. This condition may be triggered or caused by:  Drinking alcohol.  Smoking.  Taking medicines, such as: ? Medicine used to treat chest pain (nitroglycerin). ? Birth control pills. ? Estrogen. ? Some blood pressure medicines.  Eating or drinking certain products.  Doing physical activity. Other things that may trigger a migraine headache include:  Having a menstrual period.  Pregnancy.  Hunger.  Stress.  Not getting enough sleep or getting too much  sleep.  Weather changes.  Tiredness (fatigue). What increases the risk?  Being 69-56 years old.  Being female.  Having a family history of migraine headaches.  Being Caucasian.  Having depression or anxiety.  Being very overweight. What are the signs or symptoms?  A throbbing pain. This pain may: ? Happen in any area of the head, such as on one side or both sides. ? Make it hard to do daily activities. ? Get worse with physical activity. ? Get worse around bright lights or loud noises.  Other symptoms may include: ? Feeling sick to your stomach (nauseous). ? Vomiting. ? Dizziness. ? Being sensitive to bright lights, loud noises, or smells.  Before you get a migraine headache, you may get warning signs (an aura). An aura may include: ? Seeing flashing lights or having blind spots. ? Seeing bright spots, halos, or zigzag lines. ? Having tunnel vision or blurred vision. ? Having numbness or a tingling feeling. ? Having trouble talking. ? Having weak muscles.  Some people have symptoms after a migraine headache (postdromal phase), such as: ? Tiredness. ? Trouble thinking (concentrating). How is this treated?  Taking medicines that: ? Relieve pain. ? Relieve the feeling of being sick to your stomach. ? Prevent migraine headaches.  Treatment may also include: ? Having acupuncture. ? Avoiding foods that bring on migraine headaches. ? Learning ways to control your body functions (biofeedback). ? Therapy to help you know and deal with negative thoughts (cognitive behavioral therapy). Follow these instructions at home: Medicines  Take over-the-counter and prescription medicines only as told by your doctor.  Ask  your doctor if the medicine prescribed to you: ? Requires you to avoid driving or using heavy machinery. ? Can cause trouble pooping (constipation). You may need to take these steps to prevent or treat trouble pooping:  Drink enough fluid to keep your pee  (urine) pale yellow.  Take over-the-counter or prescription medicines.  Eat foods that are high in fiber. These include beans, whole grains, and fresh fruits and vegetables.  Limit foods that are high in fat and sugar. These include fried or sweet foods. Lifestyle  Do not drink alcohol.  Do not use any products that contain nicotine or tobacco, such as cigarettes, e-cigarettes, and chewing tobacco. If you need help quitting, ask your doctor.  Get at least 8 hours of sleep every night.  Limit and deal with stress. General instructions      Keep a journal to find out what may bring on your migraine headaches. For example, write down: ? What you eat and drink. ? How much sleep you get. ? Any change in what you eat or drink. ? Any change in your medicines.  If you have a migraine headache: ? Avoid things that make your symptoms worse, such as bright lights. ? It may help to lie down in a dark, quiet room. ? Do not drive or use heavy machinery. ? Ask your doctor what activities are safe for you.  Keep all follow-up visits as told by your doctor. This is important. Contact a doctor if:  You get a migraine headache that is different or worse than others you have had.  You have more than 15 headache days in one month. Get help right away if:  Your migraine headache gets very bad.  Your migraine headache lasts longer than 72 hours.  You have a fever.  You have a stiff neck.  You have trouble seeing.  Your muscles feel weak or like you cannot control them.  You start to lose your balance a lot.  You start to have trouble walking.  You pass out (faint).  You have a seizure. Summary  A migraine headache is a very strong throbbing pain on one side or both sides of your head. These headaches can also cause other symptoms.  This condition may be treated with medicines and changes to your lifestyle.  Keep a journal to find out what may bring on your migraine  headaches.  Contact a doctor if you get a migraine headache that is different or worse than others you have had.  Contact your doctor if you have more than 15 headache days in a month. This information is not intended to replace advice given to you by your health care provider. Make sure you discuss any questions you have with your health care provider. Document Revised: 03/01/2019 Document Reviewed: 12/20/2018 Elsevier Patient Education  [place].    Sleep Apnea Sleep apnea affects breathing during sleep. It causes breathing to stop for a short time or to become shallow. It can also increase the risk of:  Heart attack.  Stroke.  Being very overweight (obese).  Diabetes.  Heart failure.  Irregular heartbeat. The goal of treatment is to help you breathe normally again. What are the causes? There are three kinds of sleep apnea:  Obstructive sleep apnea. This is caused by a blocked or collapsed airway.  Central sleep apnea. This happens when the brain does not send the right signals to the muscles that control breathing.  Mixed sleep apnea. This is a combination  of obstructive and central sleep apnea. The most common cause of this condition is a collapsed or blocked airway. This can happen if:  Your throat muscles are too relaxed.  Your tongue and tonsils are too large.  You are overweight.  Your airway is too small. What increases the risk?  Being overweight.  Smoking.  Having a small airway.  Being older.  Being female.  Drinking alcohol.  Taking medicines to calm yourself (sedatives or tranquilizers).  Having family members with the condition. What are the signs or symptoms?  Trouble staying asleep.  Being sleepy or tired during the day.  Getting angry a lot.  Loud snoring.  Headaches in the morning.  Not being able to focus your mind (concentrate).  Forgetting things.  Less interest in sex.  Mood swings.  Personality  changes.  Feelings of sadness (depression).  Waking up a lot during the night to pee (urinate).  Dry mouth.  Sore throat. How is this diagnosed?  Your medical history.  A physical exam.  A test that is done when you are sleeping (sleep study). The test is most often done in a sleep lab but may also be done at home. How is this treated?   Sleeping on your side.  Using a medicine to get rid of mucus in your nose (decongestant).  Avoiding the use of alcohol, medicines to help you relax, or certain pain medicines (narcotics).  Losing weight, if needed.  Changing your diet.  Not smoking.  Using a machine to open your airway while you sleep, such as: ? An oral appliance. This is a mouthpiece that shifts your lower jaw forward. ? A CPAP device. This device blows air through a mask when you breathe out (exhale). ? An EPAP device. This has valves that you put in each nostril. ? A BPAP device. This device blows air through a mask when you breathe in (inhale) and breathe out.  Having surgery if other treatments do not work. It is important to get treatment for sleep apnea. Without treatment, it can lead to:  High blood pressure.  Coronary artery disease.  In men, not being able to have an erection (impotence).  Reduced thinking ability. Follow these instructions at home: Lifestyle  Make changes that your doctor recommends.  Eat a healthy diet.  Lose weight if needed.  Avoid alcohol, medicines to help you relax, and some pain medicines.  Do not use any products that contain nicotine or tobacco, such as cigarettes, e-cigarettes, and chewing tobacco. If you need help quitting, ask your doctor. General instructions  Take over-the-counter and prescription medicines only as told by your doctor.  If you were given a machine to use while you sleep, use it only as told by your doctor.  If you are having surgery, make sure to tell your doctor you have sleep apnea. You may  need to bring your device with you.  Keep all follow-up visits as told by your doctor. This is important. Contact a doctor if:  The machine that you were given to use during sleep bothers you or does not seem to be working.  You do not get better.  You get worse. Get help right away if:  Your chest hurts.  You have trouble breathing in enough air.  You have an uncomfortable feeling in your back, arms, or stomach.  You have trouble talking.  One side of your body feels weak.  A part of your face is hanging down. These symptoms may  be an emergency. Do not wait to see if the symptoms will go away. Get medical help right away. Call your local emergency services (911 in the [place] Do not drive yourself to the hospital. Summary  This condition affects breathing during sleep.  The most common cause is a collapsed or blocked airway.  The goal of treatment is to help you breathe normally while you sleep. This information is not intended to replace advice given to you by your health care provider. Make sure you discuss any questions you have with your health care provider. Document Revised: 08/24/2018 Document Reviewed: 07/03/2018 Elsevier Patient Education  [place].

## 2020-11-10 DIAGNOSIS — Z1212 Encounter for screening for malignant neoplasm of rectum: Secondary | ICD-10-CM | POA: Diagnosis not present

## 2020-11-17 NOTE — Progress Notes (Signed)
community message sent in for cpap supplies

## 2020-11-19 DIAGNOSIS — G4733 Obstructive sleep apnea (adult) (pediatric): Secondary | ICD-10-CM | POA: Diagnosis not present

## 2020-12-10 ENCOUNTER — Encounter: Payer: Self-pay | Admitting: *Deleted

## 2020-12-10 DIAGNOSIS — Z20822 Contact with and (suspected) exposure to covid-19: Secondary | ICD-10-CM | POA: Diagnosis not present

## 2020-12-10 DIAGNOSIS — Z03818 Encounter for observation for suspected exposure to other biological agents ruled out: Secondary | ICD-10-CM | POA: Diagnosis not present

## 2020-12-10 DIAGNOSIS — K219 Gastro-esophageal reflux disease without esophagitis: Secondary | ICD-10-CM | POA: Insufficient documentation

## 2020-12-11 DIAGNOSIS — Z20822 Contact with and (suspected) exposure to covid-19: Secondary | ICD-10-CM | POA: Diagnosis not present

## 2020-12-11 DIAGNOSIS — M791 Myalgia, unspecified site: Secondary | ICD-10-CM | POA: Diagnosis not present

## 2020-12-14 DIAGNOSIS — R52 Pain, unspecified: Secondary | ICD-10-CM | POA: Diagnosis not present

## 2020-12-14 DIAGNOSIS — R059 Cough, unspecified: Secondary | ICD-10-CM | POA: Diagnosis not present

## 2020-12-14 DIAGNOSIS — G4733 Obstructive sleep apnea (adult) (pediatric): Secondary | ICD-10-CM | POA: Diagnosis not present

## 2020-12-14 DIAGNOSIS — J029 Acute pharyngitis, unspecified: Secondary | ICD-10-CM | POA: Diagnosis not present

## 2021-01-20 ENCOUNTER — Other Ambulatory Visit: Payer: Self-pay | Admitting: Oncology

## 2021-01-20 DIAGNOSIS — M8580 Other specified disorders of bone density and structure, unspecified site: Secondary | ICD-10-CM

## 2021-01-20 DIAGNOSIS — Z9889 Other specified postprocedural states: Secondary | ICD-10-CM

## 2021-02-23 DIAGNOSIS — G4733 Obstructive sleep apnea (adult) (pediatric): Secondary | ICD-10-CM | POA: Diagnosis not present

## 2021-03-30 ENCOUNTER — Other Ambulatory Visit: Payer: Self-pay | Admitting: Oncology

## 2021-03-30 DIAGNOSIS — Z853 Personal history of malignant neoplasm of breast: Secondary | ICD-10-CM

## 2021-04-02 ENCOUNTER — Other Ambulatory Visit: Payer: Self-pay

## 2021-04-02 ENCOUNTER — Ambulatory Visit
Admission: RE | Admit: 2021-04-02 | Discharge: 2021-04-02 | Disposition: A | Discharge status: Home / Self Care | Payer: PRIVATE HEALTH INSURANCE | Source: Ambulatory Visit | Attending: Oncology | Admitting: Oncology

## 2021-04-02 DIAGNOSIS — Z853 Personal history of malignant neoplasm of breast: Secondary | ICD-10-CM

## 2021-04-02 DIAGNOSIS — R922 Inconclusive mammogram: Secondary | ICD-10-CM | POA: Diagnosis not present

## 2021-04-15 ENCOUNTER — Other Ambulatory Visit: Payer: Self-pay | Admitting: *Deleted

## 2021-04-15 ENCOUNTER — Inpatient Hospital Stay: Payer: BC Managed Care – PPO | Attending: Oncology | Admitting: Oncology

## 2021-04-15 ENCOUNTER — Other Ambulatory Visit: Payer: Self-pay

## 2021-04-15 ENCOUNTER — Inpatient Hospital Stay: Payer: BC Managed Care – PPO

## 2021-04-15 VITALS — BP 133/75 | HR 79 | Temp 98.1°F | Resp 18 | Ht 66.0 in | Wt 228.8 lb

## 2021-04-15 DIAGNOSIS — Z79811 Long term (current) use of aromatase inhibitors: Secondary | ICD-10-CM | POA: Insufficient documentation

## 2021-04-15 DIAGNOSIS — D0511 Intraductal carcinoma in situ of right breast: Secondary | ICD-10-CM | POA: Diagnosis not present

## 2021-04-15 DIAGNOSIS — D6851 Activated protein C resistance: Secondary | ICD-10-CM

## 2021-04-15 DIAGNOSIS — Z923 Personal history of irradiation: Secondary | ICD-10-CM | POA: Diagnosis not present

## 2021-04-15 DIAGNOSIS — M797 Fibromyalgia: Secondary | ICD-10-CM | POA: Diagnosis not present

## 2021-04-15 DIAGNOSIS — Z7982 Long term (current) use of aspirin: Secondary | ICD-10-CM | POA: Insufficient documentation

## 2021-04-15 DIAGNOSIS — Z79899 Other long term (current) drug therapy: Secondary | ICD-10-CM | POA: Insufficient documentation

## 2021-04-15 LAB — CMP (CANCER CENTER ONLY)
ALT: 57 U/L — ABNORMAL HIGH (ref 0–44)
AST: 32 U/L (ref 15–41)
Albumin: 4.2 g/dL (ref 3.5–5.0)
Alkaline Phosphatase: 62 U/L (ref 38–126)
Anion gap: 8 (ref 5–15)
BUN: 15 mg/dL (ref 6–20)
CO2: 26 mmol/L (ref 22–32)
Calcium: 9.6 mg/dL (ref 8.9–10.3)
Chloride: 106 mmol/L (ref 98–111)
Creatinine: 0.95 mg/dL (ref 0.44–1.00)
GFR, Estimated: >60 mL/min (ref >60)
Glucose, Bld: 102 mg/dL — ABNORMAL HIGH (ref 70–99)
Potassium: 4.5 mmol/L (ref 3.5–5.1)
Sodium: 140 mmol/L (ref 135–145)
Total Bilirubin: 0.4 mg/dL (ref 0.3–1.2)
Total Protein: 7.7 g/dL (ref 6.5–8.1)

## 2021-04-15 LAB — CBC WITH DIFFERENTIAL (CANCER CENTER ONLY)
Abs Immature Granulocytes: 0.02 10*3/uL (ref 0.00–0.07)
Basophils Absolute: 0 10*3/uL (ref 0.0–0.1)
Basophils Relative: 1 %
Eosinophils Absolute: 0.2 10*3/uL (ref 0.0–0.5)
Eosinophils Relative: 4 %
HCT: 37.4 % (ref 36.0–46.0)
Hemoglobin: 12.6 g/dL (ref 12.0–15.0)
Immature Granulocytes: 0 %
Lymphocytes Relative: 23 %
Lymphs Abs: 1.6 10*3/uL (ref 0.7–4.0)
MCH: 32 pg (ref 26.0–34.0)
MCHC: 33.7 g/dL (ref 30.0–36.0)
MCV: 94.9 fL (ref 80.0–100.0)
Monocytes Absolute: 0.5 10*3/uL (ref 0.1–1.0)
Monocytes Relative: 7 %
Neutro Abs: 4.3 10*3/uL (ref 1.7–7.7)
Neutrophils Relative %: 65 %
Platelet Count: 223 10*3/uL (ref 150–400)
RBC: 3.94 MIL/uL (ref 3.87–5.11)
RDW: 11.6 % (ref 11.5–15.5)
WBC Count: 6.6 10*3/uL (ref 4.0–10.5)
nRBC: 0 % (ref 0.0–0.2)

## 2021-04-15 MED ORDER — ANASTROZOLE 1 MG PO TABS: 1.0000 mg | ORAL_TABLET | Freq: Every day | ORAL | 4 refills | Status: DC

## 2021-04-15 NOTE — Progress Notes (Signed)
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   She continues on anastrozole.  She generally tolerates this well and specifically hot flashes are not a major issue.  She does have arthralgias and myalgias but she thinks this is more likely to be fibromyalgia than anastrozole.  We did discuss taking a vacation from anastrozole for 2 months which would settle the issue but she was concerned enough she preferred not to.  He also tried to switch to exemestane but that was extremely costly and she is staying with anastrozole.  She had bilateral diagnostic mammography at the [facility] 04/02/2021 showing breast density category B.  There was no evidence of malignancy.  REVIEW OF SYSTEMS: Hae aside from the anastrozole Yvonne Wyatt is under quite a bit of stress because of changes at work.  On the plus side her husband is retiring as of March 2023.  Also on the plus side they had their first grandchild.  Currently she is walking for exercise but does not have a regular schedule and finds that she is taking a lot of time at work because of the changes going through that office.  A detailed review of systems was otherwise stable   HISTORY OF CURRENT  ILLNESS: From the original intake note:  Yvonne Wyatt had routine screening mammography on 03/14/2018 showing a possible abnormality in both breasts. She underwent bilateral diagnostic mammography with tomography and bilateral breast ultrasonography at [facility] on 03/19/2018 showing: breast density category B,  Persistent distortion in the Upper-Outer Quadrant of the Right breast without sonographic correlate. No axillary adenopathy by ultrasound. Numerous bilateral simple breast cysts. Stereotactic guided core biopsy was recommended.  Accordingly on 03/19/2018 she proceeded to biopsy of the right breast area in question. The pathology from this procedure showed ([id number]): complex sclerosing lesion with ductal hyperplasia. Fibrocystic changes with usual ductal hyperplasia.   She was then referred to surgery and after appropriate discussion on 04/30/2018 she underwent a right breast lumpectomy, showing extensive ductal carcinoma IN SITU (DCIS), intermediate nuclear grade with extension to multiple foci of sclerosing adenosis and complex sclerosing lesions. Negative for invasive carcinoma. All resection margins are negative for DCIS; closest is the anterior margin at 1.5 mm. Both medial and lateral margins are at 3 mm.  The prognostic panel showed the estrogen receptor at 100%, with strong staining intensity, estrogen receptor 90% positive, strong staining intensity. Stage 0 (pTis, pNX)  The patient's subsequent history is as detailed below.   PAST MEDICAL HISTORY: Past Medical History:  Diagnosis Date  . Anxiety   . Breast cancer ([place]) 04/2018   right breast  . Complication of anesthesia   . Constipation   . Depression   . Dyspareunia 05/20/2014   Early menopause age 60  . Ectatic aorta ([place])   . Fatigue   . Fatty liver   . GERD (  gastroesophageal reflux disease)   . Headache   . Hemorrhoids   . Heterozygous factor V Leiden mutation ([place]) 05/20/2014   Dx 2007  No personal hx  of thrombosis;  mother DVT age 64 on OC  . History of hiatal hernia   . History of radiation therapy 06/11/18- 07/06/18   Right breast- 15 fractions for a total dose of 40.05 Gy, Right breast boost- 5 fractions for a total dose of 10 Gy  . Hypertension   . Hypothyroidism   . Osteopenia   . Personal history of radiation therapy 2019  . Plantar fasciitis   . PONV (postoperative nausea and vomiting)   . Sleep apnea   . Vitamin D deficiency     PAST SURGICAL HISTORY: Past Surgical History:  Procedure Laterality Date  . BREAST LUMPECTOMY Right 04/2018  . CESAREAN SECTION    . DIAGNOSTIC LAPAROSCOPY     for endometriosis  . GANGLION CYST EXCISION Right    right wrist  . INGUINAL HERNIA REPAIR     x 2 , at 18 months and 60 years old.   [name] RADIOACTIVE SEED GUIDED EXCISIONAL BREAST BIOPSY Right 04/30/2018   Procedure: RIGHT RADIOACTIVE SEED GUIDED EXCISIONAL BREAST BIOPSY ERAS PATHWAY;  Surgeon: Rolm Bookbinder, MD;  Location: [facility];  Service: General;  Laterality: Right;  . SINUS EXPLORATION  1990    FAMILY HISTORY Family History  Problem Relation Age of Onset  . CAD Mother   . Deep vein thrombosis Mother   . Hyperlipidemia Mother   . COPD Mother   . AAA (abdominal aortic aneurysm) Mother   . Dementia Mother   . High blood pressure Mother   . Kidney disease Mother   . COPD Father   . Alcoholism Father   . CVA Brother   . Diabetes Brother   . Hyperlipidemia Brother    The patient's father died at the age of 16 from emphysema.  The patient's mother is still living as of July 2019, age 58.  She had a history of superficial clots and some cardiovascular disease.  The patient had one brother who died at age 64 from a stroke.  The patient had 2 sisters.  There is no history of breast cancer ovarian cancer or other clotting history in the family to her knowledge   GYNECOLOGIC HISTORY:  No LMP recorded. Patient is postmenopausal. Menarche: 60 years old Age at first live birth: 60  years old  years old [place] P 1 LMP was at 60 years old Took oral contraceptives for many years (history of endometriosis) HRT no  Hysterectomy? No  BSO? No   SOCIAL HISTORY: (As of July 2019) She is a Marine scientist at [employer].  Her first husband died at 29 at age 80 from [place] in 2002.  Her current husband, Gershon Mussel, builds Medical sales representative for [employer]. Their son Barnabas Lister, 30, is a Engineer, building services. Gershon Mussel has two children from a pervious marriage, Raquel Sarna, who lives in [place], and Unadilla, lives in [place].  There are no grandchildren.  Diva attends a [employer].    ADVANCED DIRECTIVES: The patient's husband is her healthcare power of attorney   HEALTH MAINTENANCE: Social History   Tobacco Use  . Smoking status: Never Smoker  . Smokeless tobacco: Never Used  Vaping Use  . Vaping Use: Never used  Substance Use Topics  . Alcohol use: Yes    Comment: occasional  . Drug use: No     Colonoscopy: UTD/ [facility]  PAP: UTD/ [facility]  Bone density: 04/25/2016,  t-score of -0.9   Allergies  Allergen Reactions  . Cat Hair Extract Itching and Other (See Comments)  . Codeine Hives and Rash    Current Outpatient Medications  Medication Sig Dispense Refill  . Acetaminophen (TYLENOL EXTRA STRENGTH PO) Take 1,000 mg by mouth daily as needed (headache/pain).     [name] anastrozole (ARIMIDEX) 1 MG tablet TAKE 1 TABLET BY MOUTH DAILY 30 tablet 11  . aspirin EC 81 MG tablet Take 81 mg by mouth daily.    . Calcium Citrate (CITRACAL PO) Take 2 tablets by mouth daily.     [name] desvenlafaxine (PRISTIQ) 50 MG 24 hr tablet Take 50 mg by mouth daily.    [name] docusate sodium (COLACE) 100 MG capsule Take 100 mg by mouth as needed for mild constipation.    [name] FOLIC ACID PO Take 4,854 mcg by mouth daily.     [name] gabapentin (NEURONTIN) 300 MG capsule Take 1 capsule (300 mg total) by mouth at bedtime. May titrate up to 2 at bedtime in needed after 1 week. 180 capsule 0  . Hydrocortisone (PROCTOSOL HC RE) Place 1 application rectally 2 (two) times  daily as needed (hemmorrhoids).    [name] levothyroxine (SYNTHROID, LEVOTHROID) 50 MCG tablet Take 1.5 tablets (75 mcg total) by mouth daily before breakfast.    . lisinopril (PRINIVIL,ZESTRIL) 20 MG tablet Take 20 mg by mouth daily.  11  . Lysine 500 MG TABS Take 2 tablets (1,000 mg total) by mouth daily.    . Magnesium 250 MG TABS Take 250 mg by mouth at bedtime.    . Melatonin 3 MG CAPS Take 3 mg by mouth at bedtime.     . Multiple Vitamin (MULTIVITAMIN WITH MINERALS) TABS tablet Take 1 tablet by mouth daily.    [name] nystatin-triamcinolone ointment (MYCOLOG) Apply 1 application topically 2 (two) times daily as needed.    . pantoprazole (PROTONIX) 40 MG tablet Take 1 tablet (40 mg total) by mouth daily. 180 tablet 0  . Probiotic Product (ALIGN PO) Take 1 tablet by mouth daily.     . promethazine (PHENERGAN) 25 MG tablet Take 1 tablet (25 mg total) by mouth every 6 (six) hours as needed for nausea or vomiting. 30 tablet 0  . rosuvastatin (CRESTOR) 10 MG tablet Take 1 tablet (10 mg total) by mouth daily.    . valACYclovir (VALTREX) 1000 MG tablet TAKE 1/2 TABLET DAILY FOR SUPPRESSION  5  . Vitamin D, Ergocalciferol, 2000 units CAPS Take 2,000 Units by mouth daily.      No current facility-administered medications for this visit.    OBJECTIVE: white woman in no acute distress  Vitals:   04/15/21 1248  BP: 133/75  Pulse: 79  Resp: 18  Temp: 98.1 F (36.7 C)  SpO2: 99%     Body mass index is 36.93 kg/m.   Wt Readings from Last 3 Encounters:  04/15/21 228 lb 12.8 oz (103.8 kg)  11/05/20 223 lb (101.2 kg)  04/14/20 226 lb 11.2 oz (102.8 kg)      ECOG FS:1 - Symptomatic but completely ambulatory  Sclerae unicteric, EOMs intact Wearing a mask No cervical or supraclavicular adenopathy Lungs no rales or rhonchi Heart regular rate and rhythm Abd soft, nontender, positive bowel sounds MSK no focal spinal tenderness, no upper extremity lymphedema Neuro: nonfocal, well oriented, positive  affect Breasts: The right breast has undergone lumpectomy and radiation.  There is no evidence of local recurrence.  The left breast is benign.  Both axillae  are benign.   LAB RESULTS:  CMP     Component Value Date/Time   NA 140 10/08/2019 0946   NA 141 06/17/2019 1253   NA 140 05/02/2006 1027   K 4.7 10/08/2019 0946   K 4.0 05/02/2006 1027   CL 104 10/08/2019 0946   CL 104 05/02/2006 1027   CO2 23 10/08/2019 0946   CO2 22 05/02/2006 1027   GLUCOSE 101 (H) 10/08/2019 0946   GLUCOSE 99 05/02/2006 1027   BUN 19 10/08/2019 0946   BUN 16 06/17/2019 1253   BUN 12 05/02/2006 1027   CREATININE 1.07 (H) 10/08/2019 0946   CREATININE 0.9 05/02/2006 1027   CALCIUM 9.4 10/08/2019 0946   CALCIUM 9.6 05/02/2006 1027   PROT 7.8 10/08/2019 0946   PROT 7.5 06/17/2019 1253   ALBUMIN 4.5 10/08/2019 0946   ALBUMIN 4.8 06/17/2019 1253   AST 25 10/08/2019 0946   ALT 40 10/08/2019 0946   ALKPHOS 80 10/08/2019 0946   BILITOT 0.6 10/08/2019 0946   GFRNONAA 58 (L) 10/08/2019 0946   GFRAA >60 10/08/2019 0946    No results found for: TOTALPROTELP, ALBUMINELP, A1GS, A2GS, BETS, BETA2SER, GAMS, MSPIKE, SPEI  No results found for: KPAFRELGTCHN, LAMBDASER, KAPLAMBRATIO  Lab Results  Component Value Date   WBC 6.6 04/15/2021   NEUTROABS 4.3 04/15/2021   HGB 12.6 04/15/2021   HCT 37.4 04/15/2021   MCV 94.9 04/15/2021   PLT 223 04/15/2021    No results found for: LABCA2  No components found for: [id number]  No results for input(s): INR in the last 168 hours.  No results found for: LABCA2  No results found for: TTS177  No results found for: LTJ030  No results found for: SPQ330  No results found for: CA2729  No components found for: HGQUANT  No results found for: CEA1 / No results found for: CEA1   No results found for: AFPTUMOR  No results found for: CHROMOGRNA  No results found for: HGBA, HGBA2QUANT, HGBFQUANT, HGBSQUAN (Hemoglobinopathy evaluation)   No results found for:  LDH  No results found for: IRON, TIBC, IRONPCTSAT (Iron and TIBC)  No results found for: FERRITIN  Urinalysis No results found for: COLORURINE, APPEARANCEUR, LABSPEC, PHURINE, GLUCOSEU, HGBUR, BILIRUBINUR, KETONESUR, PROTEINUR, UROBILINOGEN, NITRITE, LEUKOCYTESUR   STUDIES: MM DIAG BREAST TOMO BILATERAL  Result Date: 04/02/2021 CLINICAL DATA:  60 year old female status post malignant right lumpectomy with radiation in 2019. EXAM: DIGITAL DIAGNOSTIC BILATERAL MAMMOGRAM WITH TOMOSYNTHESIS AND CAD TECHNIQUE: Bilateral digital diagnostic mammography and breast tomosynthesis was performed. The images were evaluated with computer-aided detection. COMPARISON:  Previous exam(s). ACR Breast Density Category b: There are scattered areas of fibroglandular density. FINDINGS: Stable postsurgical changes noted in the upper outer right breast. No new or suspicious findings in either breast. The parenchymal pattern is stable. IMPRESSION: 1. No mammographic evidence of malignancy in either breast. 2. Stable right breast posttreatment changes. RECOMMENDATION: Per protocol, as the patient is now 2 or more years status post lumpectomy, she may return to annual screening mammography in 1 year. However, given the history of breast cancer, the patient remains eligible for annual diagnostic mammography if preferred. I have discussed the findings and recommendations with the patient. If applicable, a reminder letter will be sent to the patient regarding the next appointment. BI-RADS CATEGORY  2: Benign. Electronically Signed   By: Kristopher Oppenheim M.D.   On: 04/02/2021 09:13      ELIGIBLE FOR AVAILABLE RESEARCH PROTOCOL: no  ASSESSMENT: 60 y.o. [place] woman status post  right lumpectomy 04/30/2018 for extensive ductal carcinoma in situ, grade 2, estrogen and progesterone receptor positive  (1) adjuvant radiation  06/11/2018 - 07/06/2018 Site/dose:   1. Right breast, 2.67 Gy in 15 fractions for a total dose of 40.05  Gy                      2. Right breast boost, 2 Gy in 5 fractions for a total dose of 10 Gy  (2) anastrozole started 07/24/2018  (a) bone density June 2017 was normal  (b) repeat bone density 06/04/2019 with a T score of -1.4  (c) alendronate started August 2020, switched to [employer] Brigitte Pulse)  (d) not a candidate for tamoxifen given Factor V Leiden heterozygosity  (e) repeat bone density due July 2022 Brigitte Pulse)  (3) heterozygous factor V Leiden mutation   PLAN: Jazma is now 3 years out from definitive surgery for her breast cancer with no evidence of disease recurrence.  This is very favorable.  She is tolerating anastrozole generally well.  I do think that at least some of what she is experiencing in terms of arthralgias and myalgias is going to be secondary to arthritis and/or fibromyalgia.  We are placing a referral to Dr. Lynne Leader in sports medicine to see if he can help her in that regard  Otherwise the plan is to continue anastrozole and additional 2 years.  After that she will "graduate" from follow-up here  She will see [place] again in 1 year  Total encounter time 20 minutes.*  Danahi Reddish, Virgie Dad, MD  04/15/21 1:02 PM Medical Oncology and Hematology [facility] [place], [place] [address] Tel. [phone]    Fax. [phone]   I, Wilburn Mylar, am acting as scribe for Dr. Virgie Dad. Gladies Sofranko.  I, Lurline Del MD, have reviewed the above documentation for accuracy and completeness, and I agree with the above.   *Total Encounter Time as defined by the Centers for Medicare and Medicaid Services includes, in addition to the face-to-face time of a patient visit (documented in the note above) non-face-to-face time: obtaining and reviewing outside history, ordering and reviewing medications, tests or procedures, care coordination (communications with other health care professionals or caregivers) and documentation in the medical record.

## 2021-04-16 ENCOUNTER — Telehealth: Payer: Self-pay | Admitting: Hematology and Oncology

## 2021-04-16 NOTE — Telephone Encounter (Signed)
Seduled appointment per 05/26 los. Patient is aware.

## 2021-05-03 ENCOUNTER — Ambulatory Visit: Payer: BC Managed Care – PPO | Admitting: Family Medicine

## 2021-05-04 ENCOUNTER — Encounter: Payer: Self-pay | Admitting: Family Medicine

## 2021-05-04 ENCOUNTER — Other Ambulatory Visit: Payer: Self-pay

## 2021-05-04 ENCOUNTER — Ambulatory Visit (INDEPENDENT_AMBULATORY_CARE_PROVIDER_SITE_OTHER): Payer: BC Managed Care – PPO | Admitting: Family Medicine

## 2021-05-04 VITALS — BP 128/74 | HR 74 | Ht 66.0 in | Wt 229.0 lb

## 2021-05-04 DIAGNOSIS — M2550 Pain in unspecified joint: Secondary | ICD-10-CM

## 2021-05-04 DIAGNOSIS — M7061 Trochanteric bursitis, right hip: Secondary | ICD-10-CM | POA: Diagnosis not present

## 2021-05-04 DIAGNOSIS — M791 Myalgia, unspecified site: Secondary | ICD-10-CM | POA: Diagnosis not present

## 2021-05-04 DIAGNOSIS — M7062 Trochanteric bursitis, left hip: Secondary | ICD-10-CM

## 2021-05-04 DIAGNOSIS — M778 Other enthesopathies, not elsewhere classified: Secondary | ICD-10-CM | POA: Diagnosis not present

## 2021-05-04 NOTE — Progress Notes (Signed)
I, Yvonne Wyatt, LAT, ATC acting as a scribe for Yvonne Leader, MD.  Subjective:    I'm seeing this patient as a consultation for: Yvonne Bihari, NP and Dr. Jana Wyatt. Note will be routed back to referring provider/PCP.  CC: Pain in multiple joints  HPI: Pt is a 60 y/o female c/o pain in multiple joints. Pt reports pain started after beginning anastrozol, but pt suspects fibromyalgia to be the cause. Of note, pt had a recent dx of R-sided breast cancer and underwent a lumpectomy on 04/30/18. Pt locates pain to seems to be worse in bilat hip (L>R), bilat elbows, and bilat shoulders. Pt notes that joint pain has gotten worse since starting on anastrozol. Pt works at [employer] as a  Marine scientist.   Past medical history, Surgical history, Family history, Social history, Allergies, and medications have been entered into the medical record, reviewed.   Review of Systems: No new headache, visual changes, nausea, vomiting, diarrhea, constipation, dizziness, abdominal pain, skin rash, fevers, chills, night sweats, weight loss, swollen lymph nodes, body aches, joint swelling, muscle aches, chest pain, shortness of breath, mood changes, visual or auditory hallucinations.   Objective:    Vitals:   05/04/21 1229  BP: 128/74  Pulse: 74  SpO2: 98%   General: Well Developed, well nourished, and in no acute distress.  Neuro/Psych: Alert and oriented x3, extra-ocular muscles intact, able to move all 4 extremities, sensation grossly intact. Skin: Warm and dry, no rashes noted.  Respiratory: Not using accessory muscles, speaking in full sentences, trachea midline.  Cardiovascular: Pulses palpable, no extremity edema. Abdomen: Does not appear distended. MSK: Right forearm normal-appearing mildly tender palpation lateral epicondyle and volar forearm. Normal elbow motion and strength.  Pain with resisted pronation supination.  Right hip normal appearing Normal motion. Tender palpation greater  trochanter. Hip abduction strength intact pain with resisted hip abduction.  Left hip normal-appearing Normal motion. Tender palpation greater trochanter.  Hip abduction strength diminished 4/5.  External rotation strength diminished 4/5.  Impression and Recommendations:    Assessment and Plan: 60 y.o. female with right forearm pain and bilateral hip pain associated with more diffuse myalgias and arthralgias.  Patient has somewhat diffuse arthralgias and myalgias occurring prior to starting anastrozole but worsening after anastrozole.  I believe that some of the arthralgia is probably due to the anastrozole but certainly not all of it.  After discussion with patient and based on reading of notes with Dr. Jana Wyatt earlier I think it is worthwhile to continue the anastrozole.  I think it is possible that she does have fibromyalgia but patient would like to treat the individual issues causing pain today more discretely and then potentially proceed to medication management for fibromyalgia later if this is not successful.  I think this is a reasonable strategy.  Additionally recommend general increasing activity level and general exercise as this has been successful for management of fibromyalgia in the past.  As for the forearm pain most likely explanation is tendinopathy of the pronator and supinator's of the forearm.  This should be successfully treated with physical therapy.  Plan to refer to [facility] as she works near that office.  Bilateral lateral hip pain thought to be due to hip abductor tendinopathy and greater trochanteric bursitis.  This should be well treated with physical therapy.  Again referral as above.  Recheck in 6 weeks.  Certainly could proceed with further work-up and management strategies in the future if needed.  PDMP not reviewed this  encounter. Orders Placed This Encounter  Procedures   Ambulatory referral to Physical Therapy    Referral Priority:   Routine     Referral Type:   Physical Medicine    Referral Reason:   Specialty Services Required    Requested Specialty:   Physical Therapy    Number of Visits Requested:   1   No orders of the defined types were placed in this encounter.   Discussed warning signs or symptoms. Please see discharge instructions. Patient expresses understanding.   The above documentation has been reviewed and is accurate and complete Yvonne Wyatt, M.D.

## 2021-05-04 NOTE — Patient Instructions (Signed)
Thank you for coming in today.   I've referred you to Physical Therapy.  Let us know if you don't hear from them in one week.   Try to stay active. Swimming and walking would be good.   Recheck with me in 6 weeks.   Keep me updated. If you have a problem please let me know.

## 2021-05-07 ENCOUNTER — Other Ambulatory Visit: Payer: Self-pay | Admitting: Gastroenterology

## 2021-05-07 DIAGNOSIS — R9389 Abnormal findings on diagnostic imaging of other specified body structures: Secondary | ICD-10-CM

## 2021-06-03 ENCOUNTER — Ambulatory Visit
Admission: RE | Admit: 2021-06-03 | Discharge: 2021-06-03 | Disposition: A | Discharge status: Home / Self Care | Payer: BC Managed Care – PPO | Source: Ambulatory Visit | Attending: Gastroenterology | Admitting: Gastroenterology

## 2021-06-03 DIAGNOSIS — M8580 Other specified disorders of bone density and structure, unspecified site: Secondary | ICD-10-CM | POA: Diagnosis not present

## 2021-06-03 DIAGNOSIS — K760 Fatty (change of) liver, not elsewhere classified: Secondary | ICD-10-CM | POA: Diagnosis not present

## 2021-06-03 DIAGNOSIS — R9389 Abnormal findings on diagnostic imaging of other specified body structures: Secondary | ICD-10-CM

## 2021-06-09 ENCOUNTER — Other Ambulatory Visit: Payer: Self-pay

## 2021-06-09 ENCOUNTER — Ambulatory Visit
Admission: RE | Admit: 2021-06-09 | Discharge: 2021-06-09 | Disposition: A | Discharge status: Home / Self Care | Payer: BC Managed Care – PPO | Source: Ambulatory Visit | Attending: Oncology | Admitting: Oncology

## 2021-06-09 DIAGNOSIS — Z780 Asymptomatic menopausal state: Secondary | ICD-10-CM | POA: Diagnosis not present

## 2021-06-09 DIAGNOSIS — M8580 Other specified disorders of bone density and structure, unspecified site: Secondary | ICD-10-CM

## 2021-06-09 DIAGNOSIS — M8588 Other specified disorders of bone density and structure, other site: Secondary | ICD-10-CM | POA: Diagnosis not present

## 2021-06-10 ENCOUNTER — Telehealth: Payer: Self-pay

## 2021-06-10 NOTE — Telephone Encounter (Signed)
-----   Message from Gardenia Phlegm, NP sent at 06/09/2021  1:33 PM EDT ----- Bone density shows mild osteopenia.  Please notify patients.  Continue calcium, vitamin d, and weight bearing exercises.  Unsure if she is currently receiving or previously received prolia--its noted in results but not in our data .   ----- Message ----- From: Interface, Rad Results In Sent: 06/09/2021  10:17 AM EDT To: Chauncey Cruel, MD

## 2021-06-10 NOTE — Telephone Encounter (Signed)
Called pt to inform her of results. Pt states she is currently receiving prolia injections and takes Citrical. Advised pt to start weight bearing exercises. Pt verbalized thanks and understanding.

## 2021-06-15 DIAGNOSIS — G4733 Obstructive sleep apnea (adult) (pediatric): Secondary | ICD-10-CM | POA: Diagnosis not present

## 2021-07-26 DIAGNOSIS — R059 Cough, unspecified: Secondary | ICD-10-CM | POA: Diagnosis not present

## 2021-07-26 DIAGNOSIS — Z8616 Personal history of COVID-19: Secondary | ICD-10-CM | POA: Diagnosis not present

## 2021-07-29 DIAGNOSIS — R051 Acute cough: Secondary | ICD-10-CM | POA: Diagnosis not present

## 2021-07-29 DIAGNOSIS — U071 COVID-19: Secondary | ICD-10-CM | POA: Diagnosis not present

## 2021-08-02 ENCOUNTER — Other Ambulatory Visit: Payer: Self-pay | Admitting: Oncology

## 2021-08-18 DIAGNOSIS — Z01419 Encounter for gynecological examination (general) (routine) without abnormal findings: Secondary | ICD-10-CM | POA: Diagnosis not present

## 2021-08-18 DIAGNOSIS — Z6836 Body mass index (BMI) 36.0-36.9, adult: Secondary | ICD-10-CM | POA: Diagnosis not present

## 2021-08-18 DIAGNOSIS — N952 Postmenopausal atrophic vaginitis: Secondary | ICD-10-CM | POA: Diagnosis not present

## 2021-08-24 DIAGNOSIS — Z23 Encounter for immunization: Secondary | ICD-10-CM | POA: Diagnosis not present

## 2021-08-24 DIAGNOSIS — D225 Melanocytic nevi of trunk: Secondary | ICD-10-CM | POA: Diagnosis not present

## 2021-08-24 DIAGNOSIS — D2271 Melanocytic nevi of right lower limb, including hip: Secondary | ICD-10-CM | POA: Diagnosis not present

## 2021-08-24 DIAGNOSIS — L918 Other hypertrophic disorders of the skin: Secondary | ICD-10-CM | POA: Diagnosis not present

## 2021-08-24 DIAGNOSIS — L719 Rosacea, unspecified: Secondary | ICD-10-CM | POA: Diagnosis not present

## 2021-09-01 DIAGNOSIS — E785 Hyperlipidemia, unspecified: Secondary | ICD-10-CM | POA: Diagnosis not present

## 2021-09-01 DIAGNOSIS — E039 Hypothyroidism, unspecified: Secondary | ICD-10-CM | POA: Diagnosis not present

## 2021-09-01 DIAGNOSIS — R7301 Impaired fasting glucose: Secondary | ICD-10-CM | POA: Diagnosis not present

## 2021-09-08 DIAGNOSIS — I10 Essential (primary) hypertension: Secondary | ICD-10-CM | POA: Diagnosis not present

## 2021-09-08 DIAGNOSIS — Z0000 Encounter for general adult medical examination without abnormal findings: Secondary | ICD-10-CM | POA: Diagnosis not present

## 2021-09-08 DIAGNOSIS — Z1331 Encounter for screening for depression: Secondary | ICD-10-CM | POA: Diagnosis not present

## 2021-10-11 DIAGNOSIS — G4733 Obstructive sleep apnea (adult) (pediatric): Secondary | ICD-10-CM | POA: Diagnosis not present

## 2021-10-25 ENCOUNTER — Other Ambulatory Visit: Payer: Self-pay

## 2021-10-25 ENCOUNTER — Ambulatory Visit (HOSPITAL_COMMUNITY)
Admission: EM | Admit: 2021-10-25 | Discharge: 2021-10-25 | Disposition: A | Discharge status: Home / Self Care | Payer: BC Managed Care – PPO | Attending: Urgent Care | Admitting: Urgent Care

## 2021-10-25 ENCOUNTER — Encounter (HOSPITAL_COMMUNITY): Payer: Self-pay | Admitting: Emergency Medicine

## 2021-10-25 DIAGNOSIS — J040 Acute laryngitis: Secondary | ICD-10-CM

## 2021-10-25 DIAGNOSIS — R0981 Nasal congestion: Secondary | ICD-10-CM

## 2021-10-25 MED ORDER — PREDNISONE 10 MG (21) PO TBPK: ORAL_TABLET | Freq: Every day | ORAL | 0 refills | Status: AC

## 2021-10-25 MED ORDER — MONTELUKAST SODIUM 10 MG PO TABS: 10.0000 mg | ORAL_TABLET | Freq: Every day | ORAL | 0 refills | Status: DC

## 2021-10-25 NOTE — ED Triage Notes (Signed)
Pt c/o congestion, cough, sore throat and left ear pain for 12 days. Reports losing her voice.

## 2021-10-25 NOTE — ED Provider Notes (Signed)
Yvonne Wyatt    CSN: [id number] Arrival date & time: 10/25/21  1906      History   Chief Complaint Chief Complaint  Patient presents with   Hoarse   Nasal Congestion    HPI Yvonne Wyatt is a 60 y.o. female presents today with complaints of an upper respiratory infection that has been lingering for the past 12 days, which turned into laryngitis just this morning. She has primarily had nasal congestion, post nasal drainage and a dry cough for the first week and half. Has been taking OTC mucinex, decongestants, dayquil/nyquil, antihistamines and saline spray without relief. She denies fever. She states this morning she woke up with no voice. There is no sputum production.   HPI  Past Medical History:  Diagnosis Date   Anxiety    Breast cancer ([facility]) 04/2018   right breast   Complication of anesthesia    Constipation    Depression    Dyspareunia 05/20/2014   Early menopause age 34   Ectatic aorta ([facility])    Fatigue    Fatty liver    GERD (gastroesophageal reflux disease)    Headache    Hemorrhoids    Heterozygous factor V Leiden mutation ([place]) 05/20/2014   Dx 2007  No personal hx of thrombosis;  mother DVT age 39 on OC   History of hiatal hernia    History of radiation therapy 06/11/18- 07/06/18   Right breast- 15 fractions for a total dose of 40.05 Gy, Right breast boost- 5 fractions for a total dose of 10 Gy   Hypertension    Hypothyroidism    Osteopenia    Personal history of radiation therapy 2019   Plantar fasciitis    PONV (postoperative nausea and vomiting)    Sleep apnea    Vitamin D deficiency     Patient Active Problem List   Diagnosis Date Noted   GERD (gastroesophageal reflux disease)    Aortic atherosclerosis ([place]) 04/14/2020   Ductal carcinoma in situ (DCIS) of right breast 05/15/2018   Obstructive sleep apnea treated with continuous positive airway pressure (CPAP) 03/06/2018   Heterozygous factor V Leiden mutation ([place]) 05/20/2014    Dyspareunia 05/20/2014    Past Surgical History:  Procedure Laterality Date   BREAST LUMPECTOMY Right 04/2018   CESAREAN SECTION     DIAGNOSTIC LAPAROSCOPY     for endometriosis   GANGLION CYST EXCISION Right    right wrist   INGUINAL HERNIA REPAIR     x 2 , at 18 months and 60 years old.    RADIOACTIVE SEED GUIDED EXCISIONAL BREAST BIOPSY Right 04/30/2018   Procedure: RIGHT RADIOACTIVE SEED GUIDED EXCISIONAL BREAST BIOPSY ERAS PATHWAY;  Surgeon: Rolm Bookbinder, MD;  Location: [facility];  Service: General;  Laterality: Right;   SINUS EXPLORATION  1990    OB History     Gravida  1   Para  1   Term      Preterm      AB      Living         SAB      IAB      Ectopic      Multiple      Live Births               Home Medications    Prior to Admission medications   Medication Sig Start Date End Date Taking? Authorizing Provider  montelukast (SINGULAIR) 10 MG tablet Take 1 tablet (10 mg total)  by mouth at bedtime for 14 days. 10/25/21 11/08/21 Yes Taqwa Deem L, PA  predniSONE (STERAPRED UNI-PAK 21 TAB) 10 MG (21) TBPK tablet Take by mouth daily for 6 days. Take 6 tabs by mouth daily for 1 day, then 5 tabs for 1 day, then 4 tabs for 1 day, then 3 tabs for 1 day, 2 tabs for 1 day, then 1 tab by mouth daily for 1 days 10/25/21 10/31/21 Yes Nussen Pullin L, PA  Acetaminophen (TYLENOL EXTRA STRENGTH PO) Take 1,000 mg by mouth daily as needed (headache/pain).     [provider]  anastrozole (ARIMIDEX) 1 MG tablet Take 1 tablet (1 mg total) by mouth daily. 04/15/21   Magrinat, Virgie Dad, MD  aspirin EC 81 MG tablet Take 81 mg by mouth daily.    [provider]  Calcium Citrate (CITRACAL PO) Take 2 tablets by mouth daily.     [provider]  desvenlafaxine (PRISTIQ) 50 MG 24 hr tablet Take 50 mg by mouth daily.    [provider]  docusate sodium (COLACE) 100 MG capsule Take 100 mg by mouth as needed for mild constipation.    [provider]  FOLIC ACID PO Take 7,616 mcg by mouth daily.     [provider]  gabapentin (NEURONTIN) 300 MG capsule Take 1 capsule (300 mg total) by mouth at bedtime. May titrate up to 2 at bedtime in needed after 1 week. 08/27/20   Magrinat, Virgie Dad, MD  Hydrocortisone (PROCTOSOL HC RE) Place 1 application rectally 2 (two) times daily as needed (hemmorrhoids).    [provider]  levothyroxine (SYNTHROID, LEVOTHROID) 50 MCG tablet Take 1.5 tablets (75 mcg total) by mouth daily before breakfast. 10/30/18   Magrinat, Virgie Dad, MD  lisinopril (PRINIVIL,ZESTRIL) 20 MG tablet Take 20 mg by mouth daily. 06/24/17   [provider]  Lysine 500 MG TABS Take 2 tablets (1,000 mg total) by mouth daily. 10/30/18   Magrinat, Virgie Dad, MD  Magnesium 250 MG TABS Take 250 mg by mouth at bedtime.    [provider]  Melatonin 3 MG CAPS Take 3 mg by mouth at bedtime.     [provider]  Multiple Vitamin (MULTIVITAMIN WITH MINERALS) TABS tablet Take 1 tablet by mouth daily.    [provider]  nystatin-triamcinolone ointment (MYCOLOG) Apply 1 application topically 2 (two) times daily as needed.    [provider]  pantoprazole (PROTONIX) 40 MG tablet TAKE 1 TABLET BY MOUTH DAILY 08/02/21   Magrinat, Virgie Dad, MD  Probiotic Product (ALIGN PO) Take 1 tablet by mouth daily.     [provider]  promethazine (PHENERGAN) 25 MG tablet Take 1 tablet (25 mg total) by mouth every 6 (six) hours as needed for nausea or vomiting. 10/30/18   Magrinat, Virgie Dad, MD  rosuvastatin (CRESTOR) 10 MG tablet Take 1 tablet (10 mg total) by mouth daily. 10/30/18   Magrinat, Virgie Dad, MD  valACYclovir (VALTREX) 1000 MG tablet TAKE 1/2 TABLET DAILY FOR SUPPRESSION 07/08/17   [provider]  Vitamin D, Ergocalciferol, 2000 units CAPS Take 2,000 Units by mouth daily.     [provider]    Family History Family History  Problem Relation Age of Onset    CAD Mother    Deep vein thrombosis Mother    Hyperlipidemia Mother    COPD Mother    AAA (abdominal aortic aneurysm) Mother    Dementia Mother    High blood  pressure Mother    Kidney disease Mother    COPD Father    Alcoholism Father    CVA Brother    Diabetes Brother    Hyperlipidemia Brother     Social History Social History   Tobacco Use   Smoking status: Never   Smokeless tobacco: Never  Vaping Use   Vaping Use: Never used  Substance Use Topics   Alcohol use: Yes    Comment: occasional   Drug use: No     Allergies   Cat hair extract and Codeine   Review of Systems Review of Systems Per HPI  Physical Exam Triage Vital Signs ED Triage Vitals  Enc Vitals Group     BP 10/25/21 2006 (!) 141/85     Pulse Rate 10/25/21 2006 85     Resp 10/25/21 2006 17     Temp 10/25/21 2006 97.8 F (36.6 C)     Temp Source 10/25/21 2006 Oral     SpO2 10/25/21 2006 98 %     Weight --      Height --      Head Circumference --      Peak Flow --      Pain Score 10/25/21 2005 4     Pain Loc --      Pain Edu? --      Excl. in [place]? --    No data found.  Updated Vital Signs BP (!) 141/85 (BP Location: Left Arm)   Pulse 85   Temp 97.8 F (36.6 C) (Oral)   Resp 17   SpO2 98%   Visual Acuity Right Eye Distance:   Left Eye Distance:   Bilateral Distance:    Right Eye Near:   Left Eye Near:    Bilateral Near:     Physical Exam Vitals and nursing note reviewed.  Constitutional:      Appearance: Normal appearance. She is obese.  HENT:     Head: Normocephalic and atraumatic.     Right Ear: Tympanic membrane, ear canal and external ear normal.     Left Ear: Tympanic membrane, ear canal and external ear normal.     Ears:     Comments: Clear fluid behind TM on R    Nose: Congestion and rhinorrhea present.     Mouth/Throat:     Mouth: Mucous membranes are moist.     Pharynx: Oropharynx is clear.  Eyes:     Extraocular Movements: Extraocular movements intact.      Pupils: Pupils are equal, round, and reactive to light.  Cardiovascular:     Rate and Rhythm: Normal rate and regular rhythm.  Pulmonary:     Effort: Pulmonary effort is normal.     Breath sounds: Normal breath sounds. No wheezing or rhonchi.  Musculoskeletal:     Cervical back: Normal range of motion.  Skin:    General: Skin is warm and dry.     Coloration: Skin is not jaundiced.  Neurological:     General: No focal deficit present.     Mental Status: She is alert. Mental status is at baseline.     Motor: No weakness.  Psychiatric:        Mood and Affect: Mood normal.     UC Treatments / Results  Labs (all labs ordered are listed, but only abnormal results are displayed) Labs Reviewed - No data to display  EKG   Radiology No results found.  Procedures Procedures (including critical care time)  Medications Ordered  in UC Medications - No data to display  Initial Impression / Assessment and Plan / UC Course  I have reviewed the triage vital signs and the nursing notes.  Pertinent labs & imaging results that were available during my care of the patient were reviewed by me and considered in my medical decision making (see chart for details).     Laryngitis - suspect secondary to post nasal drainage. Pt having family visit tomorrow and hoping to complete steroid pack for sx resolution. Risks vs benefits weighed. Nasal congestion - pt has maximized OTC meds. Will add montelukast in addition to sinus rinses and antihistamines.   Final Clinical Impressions(s) / UC Diagnoses   Final diagnoses:  Laryngitis, acute  Nasal congestion     Discharge Instructions      Start prednisone taper tomorrow morning. Voice rest is important, may also use salt water gargles or lozenges as indicated. Take montelukast at night. May continue with nasal spray/ netti pot Monitor for symptom improvement, if any shortness of breath or fever, return to office.    ED Prescriptions      Medication Sig Dispense Auth. Provider   predniSONE (STERAPRED UNI-PAK 21 TAB) 10 MG (21) TBPK tablet Take by mouth daily for 6 days. Take 6 tabs by mouth daily for 1 day, then 5 tabs for 1 day, then 4 tabs for 1 day, then 3 tabs for 1 day, 2 tabs for 1 day, then 1 tab by mouth daily for 1 days 21 tablet Seith Aikey L, PA   montelukast (SINGULAIR) 10 MG tablet Take 1 tablet (10 mg total) by mouth at bedtime for 14 days. 14 tablet Parminder Trapani L, [place]      PDMP not reviewed this encounter.   Chaney Malling, [place] 10/25/21 2045

## 2021-10-25 NOTE — Discharge Instructions (Addendum)
Start prednisone taper tomorrow morning. Voice rest is important, may also use salt water gargles or lozenges as indicated. Take montelukast at night. May continue with nasal spray/ netti pot Monitor for symptom improvement, if any shortness of breath or fever, return to office.

## 2021-10-25 NOTE — ED Notes (Signed)
Pt did not answer when called in waiting area.

## 2021-11-08 NOTE — Patient Instructions (Addendum)
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
## 2021-11-09 ENCOUNTER — Encounter: Payer: Self-pay | Admitting: Family Medicine

## 2021-11-09 ENCOUNTER — Ambulatory Visit: Payer: BC Managed Care – PPO | Admitting: Family Medicine

## 2021-11-09 VITALS — BP 117/73 | HR 71 | Ht 66.25 in | Wt 215.5 lb

## 2021-11-09 DIAGNOSIS — Z9989 Dependence on other enabling machines and devices: Secondary | ICD-10-CM | POA: Diagnosis not present

## 2021-11-09 DIAGNOSIS — G4733 Obstructive sleep apnea (adult) (pediatric): Secondary | ICD-10-CM

## 2021-11-30 ENCOUNTER — Other Ambulatory Visit: Payer: Self-pay | Admitting: Hematology and Oncology

## 2021-11-30 DIAGNOSIS — Z1231 Encounter for screening mammogram for malignant neoplasm of breast: Secondary | ICD-10-CM

## 2022-02-14 ENCOUNTER — Other Ambulatory Visit: Payer: Self-pay | Admitting: *Deleted

## 2022-02-14 MED ORDER — PANTOPRAZOLE SODIUM 40 MG PO TBEC: 40.0000 mg | DELAYED_RELEASE_TABLET | Freq: Every day | ORAL | 3 refills | Status: AC

## 2022-03-29 NOTE — Progress Notes (Signed)
Patient Care Team: Ginger Organ., MD as PCP - General (Internal Medicine) Rolm Bookbinder, MD as Consulting Physician (General Surgery) Eppie Gibson, MD as Attending Physician (Radiation Oncology) Magrinat, Virgie Dad, MD (Inactive) as Consulting Physician (Oncology) Clarene Essex, MD as Consulting Physician (Gastroenterology) Martinique, Amy, MD as Consulting Physician (Dermatology)  DIAGNOSIS:  Encounter Diagnosis  Name Primary?   Ductal carcinoma in situ (DCIS) of right breast     SUMMARY OF ONCOLOGIC HISTORY: Oncology History  Ductal carcinoma in situ (DCIS) of right breast  05/15/2018 Initial Diagnosis   Ductal carcinoma in situ (DCIS) of right breast    05/15/2018 Cancer Staging   Staging form: Breast, AJCC 8th Edition - Pathologic: Stage 0 (pTis (DCIS), pN0, cM0, ER+, PR+, HER2: Not Assessed) - Signed by Eppie Gibson, MD on 05/15/2018      CHIEF COMPLIANT: Estrogen receptor positive ductal carcinoma in situ in patient with Factor V Leiden mutation on anastrozole and to establish oncology care with me    INTERVAL HISTORY: Yvonne Wyatt is a 61 y.o. with the above mention. Estrogen receptor positive ductal carcinoma in situ in patient with Factor V Leiden mutation  She presents to the clinic today for a follow-up. She state she had joint pain and hair loss and some hot flashes. But overall she is managing it. She denies pain and discomfort in her breast.  ALLERGIES:  is allergic to cat hair extract and codeine.  MEDICATIONS:  Current Outpatient Medications  Medication Sig Dispense Refill   Acetaminophen (TYLENOL EXTRA STRENGTH PO) Take 1,000 mg by mouth daily as needed (headache/pain).      anastrozole (ARIMIDEX) 1 MG tablet Take 1 tablet (1 mg total) by mouth daily. 90 tablet 3   aspirin EC 81 MG tablet Take 81 mg by mouth daily.     Calcium Citrate (CITRACAL PO) Take 2 tablets by mouth daily.      Cholecalciferol 50 MCG (2000 UT) CAPS Take 1 capsule by  mouth daily.     desvenlafaxine (PRISTIQ) 50 MG 24 hr tablet Take 50 mg by mouth daily.     docusate sodium (COLACE) 100 MG capsule Take 100 mg by mouth as needed for mild constipation.     fluconazole (DIFLUCAN) 150 MG tablet Take by mouth.     FOLIC ACID PO Take 6,962 mcg by mouth daily.      gabapentin (NEURONTIN) 300 MG capsule Take 1 capsule (300 mg total) by mouth at bedtime. May titrate up to 2 at bedtime in needed after 1 week. 180 capsule 0   Hydrocortisone (PROCTOSOL HC RE) Place 1 application rectally 2 (two) times daily as needed (hemmorrhoids).     lisinopril (PRINIVIL,ZESTRIL) 20 MG tablet Take 20 mg by mouth daily.  11   lisinopril (ZESTRIL) 10 MG tablet Take 10 mg by mouth daily.     Lysine 500 MG TABS Take 2 tablets (1,000 mg total) by mouth daily.     Magnesium 250 MG TABS Take 250 mg by mouth at bedtime.     Melatonin 3 MG CAPS Take 3 mg by mouth at bedtime.      metroNIDAZOLE (METROGEL) 1 % gel Apply topically daily. Metronidazole 1% Ivermectin 1% Azelaic acid 15%     MOUNJARO 5 MG/0.5ML Pen Inject 5 mg into the skin once a week.     Multiple Vitamin (MULTIVITAMIN WITH MINERALS) TABS tablet Take 1 tablet by mouth daily.     nystatin-triamcinolone ointment (MYCOLOG) Apply 1 application topically 2 (two)  times daily as needed.     pantoprazole (PROTONIX) 40 MG tablet Take 1 tablet (40 mg total) by mouth daily. 90 tablet 3   Probiotic Product (ALIGN PO) Take 1 tablet by mouth daily.      PROLIA 60 MG/ML SOSY injection Inject into the skin every 6 (six) months.     promethazine (PHENERGAN) 25 MG tablet Take 1 tablet (25 mg total) by mouth every 6 (six) hours as needed for nausea or vomiting. 30 tablet 0   rosuvastatin (CRESTOR) 10 MG tablet Take 1 tablet (10 mg total) by mouth daily.     SYNTHROID 75 MCG tablet Take 75 mcg by mouth every morning.     valACYclovir (VALTREX) 1000 MG tablet TAKE 1/2 TABLET DAILY FOR SUPPRESSION  5   No current facility-administered medications  for this visit.    PHYSICAL EXAMINATION: ECOG PERFORMANCE STATUS: 1 - Symptomatic but completely ambulatory  Vitals:   04/07/22 1023  BP: 130/77  Pulse: 78  Resp: 18  Temp: (!) 97.5 F (36.4 C)  SpO2: 100%   Filed Weights   04/07/22 1023  Weight: 159 lb 12.8 oz (72.5 kg)    BREAST: No palpable masses or nodules in either right or left breasts. No palpable axillary supraclavicular or infraclavicular adenopathy no breast tenderness or nipple discharge. (exam performed in the presence of a chaperone)  LABORATORY DATA:  I have reviewed the data as listed    Latest Ref Rng & Units 04/15/2021   12:34 PM 10/08/2019    9:46 AM 06/17/2019   12:53 PM  CMP  Glucose 70 - 99 mg/dL 102   101   94    BUN 6 - 20 mg/dL 15   19   16     Creatinine 0.44 - 1.00 mg/dL 0.95   1.07   1.02    Sodium 135 - 145 mmol/L 140   140   141    Potassium 3.5 - 5.1 mmol/L 4.5   4.7   4.4    Chloride 98 - 111 mmol/L 106   104   101    CO2 22 - 32 mmol/L 26   23   24     Calcium 8.9 - 10.3 mg/dL 9.6   9.4   9.8    Total Protein 6.5 - 8.1 g/dL 7.7   7.8   7.5    Total Bilirubin 0.3 - 1.2 mg/dL 0.4   0.6   0.4    Alkaline Phos 38 - 126 U/L 62   80   93    AST 15 - 41 U/L 32   25   38    ALT 0 - 44 U/L 57   40   69      Lab Results  Component Value Date   WBC 5.1 04/07/2022   HGB 12.4 04/07/2022   HCT 35.4 (L) 04/07/2022   MCV 94.1 04/07/2022   PLT 179 04/07/2022   NEUTROABS 3.1 04/07/2022    ASSESSMENT & PLAN:  Ductal carcinoma in situ (DCIS) of right breast 04/30/2018: Right lumpectomy: Extensive DCIS grade 2 ER/PR positive 06/11/2018-07/06/2018: Adjuvant radiation Current treatment: Anastrozole started 07/24/2018 x5 years History of heterozygous factor V Leiden mutation  Anastrozole toxicities: 1.  Mild joint stiffness 2. mild hot flashes  Osteopenia: Currently on bisphosphonate injections with Prolia.  She gets them with her primary care physician office.  I discussed with her that once she  comes off anastrozole the Prolia can be discontinued.  Her last  bone density July 2022 T score -1.5  Breast cancer surveillance: 1.  Breast exam 04/07/2022: Benign 2. mammogram 04/04/2022: Benign breast density category B  She works as a Midwife at [employer] Return to clinic in 1 year for follow-up and after that she could be seen on an as-needed basis    No orders of the defined types were placed in this encounter.  The patient has a good understanding of the overall plan. she agrees with it. she will call with any problems that may develop before the next visit here. Total time spent: 30 mins including face to face time and time spent for planning, charting and co-ordination of care   Harriette Ohara, MD 04/07/22    I Gardiner Coins am scribing for Dr. Lindi Adie  I have reviewed the above documentation for accuracy and completeness, and I agree with the above.

## 2022-04-04 ENCOUNTER — Ambulatory Visit
Admission: RE | Admit: 2022-04-04 | Discharge: 2022-04-04 | Disposition: A | Discharge status: Home / Self Care | Payer: 59 | Source: Ambulatory Visit | Attending: Hematology and Oncology | Admitting: Hematology and Oncology

## 2022-04-04 DIAGNOSIS — Z1231 Encounter for screening mammogram for malignant neoplasm of breast: Secondary | ICD-10-CM

## 2022-04-05 ENCOUNTER — Other Ambulatory Visit: Payer: Self-pay | Admitting: Hematology and Oncology

## 2022-04-05 DIAGNOSIS — R928 Other abnormal and inconclusive findings on diagnostic imaging of breast: Secondary | ICD-10-CM

## 2022-04-06 ENCOUNTER — Other Ambulatory Visit: Payer: Self-pay | Admitting: *Deleted

## 2022-04-06 DIAGNOSIS — D0511 Intraductal carcinoma in situ of right breast: Secondary | ICD-10-CM

## 2022-04-07 ENCOUNTER — Inpatient Hospital Stay: Payer: 59 | Attending: Hematology and Oncology

## 2022-04-07 ENCOUNTER — Other Ambulatory Visit: Payer: Self-pay

## 2022-04-07 ENCOUNTER — Inpatient Hospital Stay (HOSPITAL_BASED_OUTPATIENT_CLINIC_OR_DEPARTMENT_OTHER): Payer: 59 | Admitting: Hematology and Oncology

## 2022-04-07 DIAGNOSIS — Z7982 Long term (current) use of aspirin: Secondary | ICD-10-CM | POA: Diagnosis not present

## 2022-04-07 DIAGNOSIS — Z79811 Long term (current) use of aromatase inhibitors: Secondary | ICD-10-CM | POA: Insufficient documentation

## 2022-04-07 DIAGNOSIS — D0511 Intraductal carcinoma in situ of right breast: Secondary | ICD-10-CM | POA: Insufficient documentation

## 2022-04-07 DIAGNOSIS — D6851 Activated protein C resistance: Secondary | ICD-10-CM | POA: Diagnosis not present

## 2022-04-07 DIAGNOSIS — M8580 Other specified disorders of bone density and structure, unspecified site: Secondary | ICD-10-CM | POA: Diagnosis not present

## 2022-04-07 DIAGNOSIS — Z79899 Other long term (current) drug therapy: Secondary | ICD-10-CM | POA: Insufficient documentation

## 2022-04-07 DIAGNOSIS — Z923 Personal history of irradiation: Secondary | ICD-10-CM | POA: Diagnosis not present

## 2022-04-07 LAB — CMP (CANCER CENTER ONLY)
ALT: 53 U/L — ABNORMAL HIGH (ref 0–44)
AST: 38 U/L (ref 15–41)
Albumin: 4.7 g/dL (ref 3.5–5.0)
Alkaline Phosphatase: 63 U/L (ref 38–126)
Anion gap: 8 (ref 5–15)
BUN: 12 mg/dL (ref 6–20)
CO2: 28 mmol/L (ref 22–32)
Calcium: 10.2 mg/dL (ref 8.9–10.3)
Chloride: 103 mmol/L (ref 98–111)
Creatinine: 1.08 mg/dL — ABNORMAL HIGH (ref 0.44–1.00)
GFR, Estimated: 60 mL/min — ABNORMAL LOW (ref >60)
Glucose, Bld: 90 mg/dL (ref 70–99)
Potassium: 3.8 mmol/L (ref 3.5–5.1)
Sodium: 139 mmol/L (ref 135–145)
Total Bilirubin: 0.9 mg/dL (ref 0.3–1.2)
Total Protein: 7.8 g/dL (ref 6.5–8.1)

## 2022-04-07 LAB — CBC WITH DIFFERENTIAL (CANCER CENTER ONLY)
Abs Immature Granulocytes: 0.01 10*3/uL (ref 0.00–0.07)
Basophils Absolute: 0.1 10*3/uL (ref 0.0–0.1)
Basophils Relative: 1 %
Eosinophils Absolute: 0.2 10*3/uL (ref 0.0–0.5)
Eosinophils Relative: 3 %
HCT: 35.4 % — ABNORMAL LOW (ref 36.0–46.0)
Hemoglobin: 12.4 g/dL (ref 12.0–15.0)
Immature Granulocytes: 0 %
Lymphocytes Relative: 25 %
Lymphs Abs: 1.3 10*3/uL (ref 0.7–4.0)
MCH: 33 pg (ref 26.0–34.0)
MCHC: 35 g/dL (ref 30.0–36.0)
MCV: 94.1 fL (ref 80.0–100.0)
Monocytes Absolute: 0.5 10*3/uL (ref 0.1–1.0)
Monocytes Relative: 10 %
Neutro Abs: 3.1 10*3/uL (ref 1.7–7.7)
Neutrophils Relative %: 61 %
Platelet Count: 179 10*3/uL (ref 150–400)
RBC: 3.76 MIL/uL — ABNORMAL LOW (ref 3.87–5.11)
RDW: 12.3 % (ref 11.5–15.5)
WBC Count: 5.1 10*3/uL (ref 4.0–10.5)
nRBC: 0 % (ref 0.0–0.2)

## 2022-04-07 MED ORDER — ANASTROZOLE 1 MG PO TABS: 1.0000 mg | ORAL_TABLET | Freq: Every day | ORAL | 3 refills | Status: DC

## 2022-04-07 NOTE — Assessment & Plan Note (Addendum)
04/30/2018: Right lumpectomy: Extensive DCIS grade 2 ER/PR positive 06/11/2018-07/06/2018: Adjuvant radiation Current treatment: Anastrozole started 07/24/2018 x5 years History of heterozygous factor V Leiden mutation  Anastrozole toxicities: 1.  Mild joint stiffness 2. mild hot flashes  Osteopenia: Currently on bisphosphonate injections with Prolia.  She gets them with her primary care physician office.  I discussed with her that once she comes off anastrozole the Prolia can be discontinued.  Her last bone density July 2022 T score -1.5  Breast cancer surveillance: 1.  Breast exam 04/07/2022: Benign 2. mammogram 04/04/2022: Benign breast density category B  Return to clinic in 1 year for follow-up and after that she could be seen on an as-needed basis

## 2022-04-13 ENCOUNTER — Other Ambulatory Visit: Payer: Self-pay | Admitting: Hematology and Oncology

## 2022-04-13 ENCOUNTER — Ambulatory Visit
Admission: RE | Admit: 2022-04-13 | Discharge: 2022-04-13 | Disposition: A | Discharge status: Home / Self Care | Source: Ambulatory Visit | Attending: Hematology and Oncology | Admitting: Hematology and Oncology

## 2022-04-13 DIAGNOSIS — R928 Other abnormal and inconclusive findings on diagnostic imaging of breast: Secondary | ICD-10-CM

## 2022-04-13 DIAGNOSIS — N6489 Other specified disorders of breast: Secondary | ICD-10-CM

## 2022-04-14 ENCOUNTER — Ambulatory Visit
Admission: RE | Admit: 2022-04-14 | Discharge: 2022-04-14 | Disposition: A | Discharge status: Home / Self Care | Source: Ambulatory Visit | Attending: Hematology and Oncology | Admitting: Hematology and Oncology

## 2022-04-14 ENCOUNTER — Other Ambulatory Visit: Payer: Self-pay | Admitting: Hematology and Oncology

## 2022-04-14 DIAGNOSIS — N6489 Other specified disorders of breast: Secondary | ICD-10-CM

## 2022-04-14 HISTORY — PX: BREAST BIOPSY: SHX20

## 2022-06-29 ENCOUNTER — Encounter (INDEPENDENT_AMBULATORY_CARE_PROVIDER_SITE_OTHER): Payer: Self-pay

## 2022-07-08 ENCOUNTER — Other Ambulatory Visit (HOSPITAL_COMMUNITY): Payer: Self-pay

## 2022-07-08 MED ORDER — MOUNJARO 12.5 MG/0.5ML ~~LOC~~ SOAJ: 12.5000 mg | SUBCUTANEOUS | 2 refills | Status: DC

## 2022-07-11 ENCOUNTER — Other Ambulatory Visit (HOSPITAL_COMMUNITY): Payer: Self-pay

## 2022-07-11 MED ORDER — MOUNJARO 10 MG/0.5ML ~~LOC~~ SOAJ
10.0000 mg | SUBCUTANEOUS | 4 refills | Status: DC
  Filled 2022-07-11: qty 2, 28d supply, fill #0
  Filled 2022-08-04: qty 2, 28d supply, fill #1

## 2022-07-12 ENCOUNTER — Other Ambulatory Visit (HOSPITAL_COMMUNITY): Payer: Self-pay

## 2022-08-05 ENCOUNTER — Other Ambulatory Visit (HOSPITAL_COMMUNITY): Payer: Self-pay

## 2022-08-16 ENCOUNTER — Encounter: Payer: Self-pay | Admitting: Family Medicine

## 2022-08-16 ENCOUNTER — Other Ambulatory Visit: Payer: Self-pay | Admitting: Neurology

## 2022-08-16 DIAGNOSIS — Z9989 Dependence on other enabling machines and devices: Secondary | ICD-10-CM

## 2022-09-01 ENCOUNTER — Other Ambulatory Visit (HOSPITAL_COMMUNITY): Payer: Self-pay

## 2022-09-02 ENCOUNTER — Other Ambulatory Visit (HOSPITAL_COMMUNITY): Payer: Self-pay

## 2022-09-02 MED ORDER — MOUNJARO 7.5 MG/0.5ML ~~LOC~~ SOAJ
7.5000 mg | SUBCUTANEOUS | 1 refills | Status: DC
  Filled 2022-09-02: qty 2, 28d supply, fill #0

## 2022-09-05 ENCOUNTER — Other Ambulatory Visit (HOSPITAL_COMMUNITY): Payer: Self-pay

## 2022-09-06 ENCOUNTER — Other Ambulatory Visit (HOSPITAL_COMMUNITY): Payer: Self-pay

## 2022-09-29 ENCOUNTER — Encounter: Payer: Self-pay | Admitting: Family Medicine

## 2022-10-03 ENCOUNTER — Other Ambulatory Visit (HOSPITAL_COMMUNITY): Payer: Self-pay

## 2022-10-03 MED ORDER — MOUNJARO 5 MG/0.5ML ~~LOC~~ SOAJ
5.0000 mg | SUBCUTANEOUS | 11 refills | Status: DC
  Filled 2022-10-03: qty 2, 28d supply, fill #0
  Filled 2022-10-27: qty 2, 28d supply, fill #1
  Filled 2022-11-24: qty 2, 28d supply, fill #2
  Filled 2022-12-23: qty 2, 28d supply, fill #3
  Filled 2023-01-21: qty 2, 28d supply, fill #4
  Filled 2023-02-17: qty 2, 28d supply, fill #5
  Filled 2023-03-17: qty 2, 28d supply, fill #6
  Filled 2023-04-16: qty 2, 28d supply, fill #7
  Filled 2023-05-14: qty 2, 28d supply, fill #8
  Filled 2023-06-11: qty 2, 28d supply, fill #9
  Filled 2023-07-07: qty 2, 28d supply, fill #10
  Filled 2023-08-04: qty 2, 28d supply, fill #11

## 2022-10-17 NOTE — Patient Instructions (Incomplete)
Please continue using your CPAP regularly. While your insurance requires that you use CPAP at least 4 hours each night on 70% of the nights, I recommend, that you not skip any nights and use it throughout the night if you can. Getting used to CPAP and staying with the treatment long term does take time and patience and discipline. Untreated obstructive sleep apnea when it is moderate to severe can have an adverse impact on cardiovascular health and raise her risk for heart disease, arrhythmias, hypertension, congestive heart failure, stroke and diabetes. Untreated obstructive sleep apnea causes sleep disruption, nonrestorative sleep, and sleep deprivation. This can have an impact on your day to day functioning and cause daytime sleepiness and impairment of cognitive function, memory loss, mood disturbance, and problems focussing. Using CPAP regularly can improve these symptoms.   I will order a repeat sleep study to be performed at home. Once data is collected and interpreted, we will determine next steps. Call me if you need anything!

## 2022-10-17 NOTE — Progress Notes (Unsigned)
PATIENT: Yvonne Wyatt DOB: 06-27-1961  REASON FOR VISIT: follow up HISTORY FROM: patient  Virtual Visit via Telephone Note  I connected with Yvonne Wyatt on 10/18/22 at  9:00 AM EST by telephone and verified that I am speaking with the correct person using two identifiers.   I discussed the limitations, risks, security and privacy concerns of performing an evaluation and management service by telephone and the availability of in person appointments. I also discussed with the patient that there may be a patient responsible charge related to this service. The patient expressed understanding and agreed to proceed.   History of Present Illness:  10/18/22 ALL (Mychart): Yvonne Wyatt is a 61 y.o. female here today for follow up for OSA on CPAP. She reports having more difficulty tolerating CPAP. She reports changing masks several times but continues to feel that pressures are too strong and has a very dry mouth. She has tried not using CPAP but reports she wakes feeling dazed and has headaches.   HST 08/2017 showed mild OSA with AHI 12.1/hr. Weight at that time was 215lbs. She now weighs about 115. She reports having a rough year taking care of her mother. She is working with PCP closely for weight stabilization.   ESS 13/24    11/09/21 ALL: Yvonne Wyatt returns for follow up for OSA on CPAP. We increased max pressure to 13cmH20 at last visit 10/2020. She does feel that it helped. She does continue to have daily tension style headaches. Previous history of migraines but she denies migrainous symptoms. Usually bilateral occipital pressure. Can wake her from sleep. She does not sleep well. She is the primary caregiver for her mother who is in her 60s. Her husband has PTSD and is a very restless sleeper. She is up and down most nights. She has hot flashes that keep her awake.    Observations/Objective:  Generalized: Well developed, in no acute distress  Mentation: Alert  oriented to time, place, history taking. Follows all commands speech and language fluent   Assessment and Plan:  61 y.o. year old female  has a past medical history of Anxiety, Breast cancer ([place]) (27/7412), Complication of anesthesia, Constipation, Depression, Dyspareunia (05/20/2014), Ectatic aorta (HCC), Fatigue, Fatty liver, GERD (gastroesophageal reflux disease), Headache, Hemorrhoids, Heterozygous factor V Leiden mutation ([place]) (05/20/2014), History of hiatal hernia, History of radiation therapy (06/11/18- 07/06/18), Hypertension, Hypothyroidism, Osteopenia, Personal history of radiation therapy (2019), Plantar fasciitis, PONV (postoperative nausea and vomiting), Rosacea, Sleep apnea, and Vitamin D deficiency. here with    ICD-10-CM   1. Obstructive sleep apnea treated with continuous positive airway pressure (CPAP)  G47.33 Home sleep test     Yvonne Wyatt reports difficulty tolerating CPAP over the past few months. She has lost about 100lbs since last sleep study in 2018. She reports feeling dazed and has headaches if not using CPAP. ESS 13/24. Compliance report shows excellent compliance over the past 30 days. I will repeat HST to determine need for CPAP therapy. We will order a new machine if needed. She will continue to follow up closely with her PCP for weight management. She will return to see me pending sleep study results.    Orders Placed This Encounter  Procedures   Home sleep test    Standing Status:   Future    Standing Expiration Date:   10/19/2023    Order Specific Question:   Where should this test be performed:    Answer:   [facility]  No orders of the defined types were placed in this encounter.    Follow Up Instructions:  I discussed the assessment and treatment plan with the patient. The patient was provided an opportunity to ask questions and all were answered. The patient agreed with the plan and demonstrated an understanding of the instructions.    The patient was advised to call back or seek an in-person evaluation if the symptoms worsen or if the condition fails to improve as anticipated.  I provided 15 minutes of non-face-to-face time during this encounter. Patient located at their place of residence during [place] visit. Provider is in the office.    Debbora Presto, NP

## 2022-10-18 ENCOUNTER — Encounter: Payer: Self-pay | Admitting: Family Medicine

## 2022-10-18 ENCOUNTER — Telehealth (INDEPENDENT_AMBULATORY_CARE_PROVIDER_SITE_OTHER): Payer: 59 | Admitting: Family Medicine

## 2022-10-18 DIAGNOSIS — G4733 Obstructive sleep apnea (adult) (pediatric): Secondary | ICD-10-CM

## 2022-10-24 ENCOUNTER — Telehealth: Payer: Self-pay | Admitting: Family Medicine

## 2022-10-24 NOTE — Telephone Encounter (Signed)
[employer] no auth req/tricare pending faxed notes

## 2022-11-08 NOTE — Telephone Encounter (Signed)
Patient returned our call.  HST- [employer] no auth req/Tricare no auth req via fax form    Patient is scheduled at [facility] for 12/06/22 at 7:30 AM.  Mailed packet to the patient.

## 2022-12-01 ENCOUNTER — Encounter: Payer: Self-pay | Admitting: Family Medicine

## 2022-12-01 ENCOUNTER — Ambulatory Visit: Payer: BC Managed Care – PPO | Admitting: Family Medicine
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
## 2022-12-06 ENCOUNTER — Ambulatory Visit: Payer: 59 | Admitting: Neurology

## 2022-12-06 DIAGNOSIS — G4733 Obstructive sleep apnea (adult) (pediatric): Secondary | ICD-10-CM

## 2022-12-06 DIAGNOSIS — R0683 Snoring: Secondary | ICD-10-CM

## 2022-12-07 NOTE — Progress Notes (Signed)
See procedure note.

## 2022-12-12 NOTE — Procedures (Signed)
   [facility]  HOME SLEEP TEST (Watch PAT) REPORT  STUDY DATE: 12/06/2022  DOB: 08/25/1961  MRN: [id number]  ORDERING CLINICIAN: Star Age, MD, PhD (Study was interpreted on behalf of Dr. Brett Fairy)     REFERRING CLINICIAN: Ginger Organ., MD (PCP), Amy Lomax, NP/Dr. Dohmeier (Sleep)  CLINICAL INFORMATION/HISTORY: 62 year old female with an underlying medical history of breast cancer, anxiety, depression, reflux disease, headaches, hypertension, hypothyroidism, osteopenia, vitamin D deficiency and rosacea, who presents for re-evaluation of her obstructive sleep apnea.  She has been compliant with AutoPap therapy but has had difficulty tolerating treatment.  She has had significant weight loss since her original diagnosis.   FINDINGS:   Sleep Summary:   Total Recording Time (hours, min): 7 hours, 51 min  Total Sleep Time (hours, min):  6 hours, 58 min  Percent REM (%):    17.9%   Respiratory Indices:   Calculated pAHI (per hour):  2.2/hour         REM pAHI:    2.4/hour       NREM pAHI: 2.1/hour  Central pAHI: 0.7/hour  Oxygen Saturation Statistics:    Oxygen Saturation (%) Mean: 95%   Minimum oxygen saturation (%):                 83%   O2 Saturation Range (%): 83 - 99%    O2 Saturation (minutes) <=88%: 0 min  Pulse Rate Statistics:   Pulse Mean (bpm):    71/min    Pulse Range (60 - 106/min)   IMPRESSION: Primary snoring   RECOMMENDATION:  This home sleep test does not demonstrate any significant obstructive or central sleep disordered breathing with a total AHI of less than 5/hour.  Her total AHI was 2.2/h, O2 nadir 83% (briefly) with rather intermittent snoring detected, ranging from mild to moderate, rarely louder. Treatment with a positive airway pressure device such as AutoPap or CPAP is not indicated based on this test. Snoring may improve with avoidance of the supine sleep position and weight loss (where clinically appropriate).    For disturbing snoring, an oral appliance through dentistry or orthodontics can be considered.  Other causes of the patient's symptoms, including circadian rhythm disturbances, an underlying mood disorder, medication effect and/or an underlying medical problem cannot be ruled out based on this test. Clinical correlation is recommended.  The patient should be cautioned not to drive, work at heights, or operate dangerous or heavy equipment when tired or sleepy. Review and reiteration of good sleep hygiene measures should be pursued with any patient. The patient will be advised to follow up with her referring provider, who will be notified of the test results.   I certify that I have reviewed the raw data recording prior to the issuance of this report in accordance with the standards of the American Academy of Sleep Medicine (AASM).  INTERPRETING PHYSICIAN:   Star Age, MD, PhD Medical Director, [facility]) [place], ABPN (Neurology and Sleep)   [facility] [address], [place], [place] [address] [phone]

## 2022-12-14 ENCOUNTER — Encounter: Payer: Self-pay | Admitting: Hematology and Oncology

## 2022-12-14 ENCOUNTER — Other Ambulatory Visit: Payer: Self-pay | Admitting: *Deleted

## 2022-12-14 ENCOUNTER — Other Ambulatory Visit: Payer: Self-pay | Admitting: Hematology and Oncology

## 2022-12-14 ENCOUNTER — Encounter: Payer: Self-pay | Admitting: Family Medicine

## 2022-12-14 DIAGNOSIS — D0511 Intraductal carcinoma in situ of right breast: Secondary | ICD-10-CM

## 2022-12-14 DIAGNOSIS — Z1231 Encounter for screening mammogram for malignant neoplasm of breast: Secondary | ICD-10-CM

## 2022-12-15 NOTE — Patient Instructions (Incomplete)
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
## 2022-12-19 ENCOUNTER — Ambulatory Visit: Payer: BC Managed Care – PPO | Admitting: Family Medicine

## 2023-02-21 ENCOUNTER — Other Ambulatory Visit (HOSPITAL_COMMUNITY): Payer: Self-pay

## 2023-02-22 ENCOUNTER — Other Ambulatory Visit (HOSPITAL_COMMUNITY): Payer: Self-pay

## 2023-03-22 ENCOUNTER — Other Ambulatory Visit (HOSPITAL_COMMUNITY): Payer: Self-pay

## 2023-04-07 ENCOUNTER — Ambulatory Visit
Admission: RE | Admit: 2023-04-07 | Discharge: 2023-04-07 | Disposition: A | Discharge status: Home / Self Care | Source: Ambulatory Visit | Attending: Hematology and Oncology | Admitting: Hematology and Oncology

## 2023-04-07 DIAGNOSIS — Z1231 Encounter for screening mammogram for malignant neoplasm of breast: Secondary | ICD-10-CM

## 2023-04-11 NOTE — Progress Notes (Signed)
Patient Care Team: Cleatis Polka., MD as PCP - General (Internal Medicine) Emelia Loron, MD as Consulting Physician (General Surgery) Lonie Peak, MD as Attending Physician (Radiation Oncology) Magrinat, Valentino Hue, MD (Inactive) as Consulting Physician (Oncology) Vida Rigger, MD as Consulting Physician (Gastroenterology) [place], Amy, MD as Consulting Physician (Dermatology)  DIAGNOSIS: No diagnosis found.  SUMMARY OF ONCOLOGIC HISTORY: Oncology History  Ductal carcinoma in situ (DCIS) of right breast  05/15/2018 Initial Diagnosis   Ductal carcinoma in situ (DCIS) of right breast   05/15/2018 Cancer Staging   Staging form: Breast, AJCC 8th Edition - Pathologic: Stage 0 (pTis (DCIS), pN0, cM0, ER+, PR+, HER2: Not Assessed) - Signed by Lonie Peak, MD on 05/15/2018     CHIEF COMPLIANT: Estrogen receptor positive ductal carcinoma in situ in patient with Factor V Leiden mutation on anastrozole   INTERVAL HISTORY: Yvonne Wyatt is a 62 y.o. with the above mention. Estrogen receptor positive ductal carcinoma in situ in patient with Factor V Leiden mutation  She presents to the clinic today for a follow-up.    ALLERGIES:  is allergic to cat hair extract and codeine.  MEDICATIONS:  Current Outpatient Medications  Medication Sig Dispense Refill   Acetaminophen (TYLENOL EXTRA STRENGTH PO) Take 1,000 mg by mouth daily as needed (headache/pain).      anastrozole (ARIMIDEX) 1 MG tablet Take 1 tablet (1 mg total) by mouth daily. 90 tablet 3   aspirin EC 81 MG tablet Take 81 mg by mouth daily.     Calcium Citrate (CITRACAL PO) Take 2 tablets by mouth daily.      Cholecalciferol 50 MCG (2000 UT) CAPS Take 1 capsule by mouth daily.     desvenlafaxine (PRISTIQ) 50 MG 24 hr tablet Take 50 mg by mouth daily.     docusate sodium (COLACE) 100 MG capsule Take 100 mg by mouth as needed for mild constipation.     fluconazole (DIFLUCAN) 150 MG tablet Take by mouth.     FOLIC ACID  PO Take 1,600 mcg by mouth daily.      gabapentin (NEURONTIN) 300 MG capsule Take 1 capsule (300 mg total) by mouth at bedtime. May titrate up to 2 at bedtime in needed after 1 week. 180 capsule 0   Hydrocortisone (PROCTOSOL HC RE) Place 1 application rectally 2 (two) times daily as needed (hemmorrhoids).     lisinopril (PRINIVIL,ZESTRIL) 20 MG tablet Take 20 mg by mouth daily.  11   lisinopril (ZESTRIL) 10 MG tablet Take 10 mg by mouth daily.     Lysine 500 MG TABS Take 2 tablets (1,000 mg total) by mouth daily.     Magnesium 250 MG TABS Take 250 mg by mouth at bedtime.     Melatonin 3 MG CAPS Take 3 mg by mouth at bedtime.      metroNIDAZOLE (METROGEL) 1 % gel Apply topically daily. Metronidazole 1% Ivermectin 1% Azelaic acid 15%     MOUNJARO 5 MG/0.5ML Pen Inject 5 mg into the skin once a week.     Multiple Vitamin (MULTIVITAMIN WITH MINERALS) TABS tablet Take 1 tablet by mouth daily.     nystatin-triamcinolone ointment (MYCOLOG) Apply 1 application topically 2 (two) times daily as needed.     pantoprazole (PROTONIX) 40 MG tablet Take 1 tablet (40 mg total) by mouth daily. 90 tablet 3   Probiotic Product (ALIGN PO) Take 1 tablet by mouth daily.      PROLIA 60 MG/ML SOSY injection Inject into the skin  every 6 (six) months.     promethazine (PHENERGAN) 25 MG tablet Take 1 tablet (25 mg total) by mouth every 6 (six) hours as needed for nausea or vomiting. 30 tablet 0   rosuvastatin (CRESTOR) 10 MG tablet Take 1 tablet (10 mg total) by mouth daily.     SYNTHROID 75 MCG tablet Take 75 mcg by mouth every morning.     tirzepatide (MOUNJARO) 10 MG/0.5ML Pen Inject 10 mg into the skin once a week. 2 mL 4   tirzepatide (MOUNJARO) 12.5 MG/0.5ML Pen Inject 12.5 mg into the skin once a week. 2 mL 2   tirzepatide (MOUNJARO) 5 MG/0.5ML Pen Inject 5 mg into the skin once a week. 2 mL 11   tirzepatide (MOUNJARO) 7.5 MG/0.5ML Pen Inject 7.5 mg into the skin once a week. 2 mL 1   valACYclovir (VALTREX) 1000  MG tablet TAKE 1/2 TABLET DAILY FOR SUPPRESSION  5   No current facility-administered medications for this visit.    PHYSICAL EXAMINATION: ECOG PERFORMANCE STATUS: {CHL ONC ECOG PS:[id number]}  There were no vitals filed for this visit. There were no vitals filed for this visit.  BREAST:*** No palpable masses or nodules in either right or left breasts. No palpable axillary supraclavicular or infraclavicular adenopathy no breast tenderness or nipple discharge. (exam performed in the presence of a chaperone)  LABORATORY DATA:  I have reviewed the data as listed    Latest Ref Rng & Units 04/07/2022   10:18 AM 04/15/2021   12:34 PM 10/08/2019    9:46 AM  CMP  Glucose 70 - 99 mg/dL 90  161  096   BUN 6 - 20 mg/dL 12  15  19    Creatinine 0.44 - 1.00 mg/dL 0.45  4.09  8.11   Sodium 135 - 145 mmol/L 139  140  140   Potassium 3.5 - 5.1 mmol/L 3.8  4.5  4.7   Chloride 98 - 111 mmol/L 103  106  104   CO2 22 - 32 mmol/L 28  26  23    Calcium 8.9 - 10.3 mg/dL 91.4  9.6  9.4   Total Protein 6.5 - 8.1 g/dL 7.8  7.7  7.8   Total Bilirubin 0.3 - 1.2 mg/dL 0.9  0.4  0.6   Alkaline Phos 38 - 126 U/L 63  62  80   AST 15 - 41 U/L 38  32  25   ALT 0 - 44 U/L 53  57  40     Lab Results  Component Value Date   WBC 5.1 04/07/2022   HGB 12.4 04/07/2022   HCT 35.4 (L) 04/07/2022   MCV 94.1 04/07/2022   PLT 179 04/07/2022   NEUTROABS 3.1 04/07/2022    ASSESSMENT & PLAN:  No problem-specific Assessment & Plan notes found for this encounter.    No orders of the defined types were placed in this encounter.  The patient has a good understanding of the overall plan. she agrees with it. she will call with any problems that may develop before the next visit here. Total time spent: 30 mins including face to face time and time spent for planning, charting and co-ordination of care   Sherlyn Lick, CMA 04/11/23    I Janan Ridge am acting as a Neurosurgeon for [employer]  ***

## 2023-04-12 ENCOUNTER — Other Ambulatory Visit: Payer: Self-pay | Admitting: *Deleted

## 2023-04-12 DIAGNOSIS — D0511 Intraductal carcinoma in situ of right breast: Secondary | ICD-10-CM

## 2023-04-13 ENCOUNTER — Inpatient Hospital Stay: Admitting: Hematology and Oncology

## 2023-04-13 ENCOUNTER — Inpatient Hospital Stay: Attending: Hematology and Oncology

## 2023-04-13 ENCOUNTER — Ambulatory Visit: Payer: 59 | Admitting: Hematology and Oncology

## 2023-04-13 ENCOUNTER — Encounter: Payer: Self-pay | Admitting: Hematology and Oncology

## 2023-04-13 VITALS — BP 112/69 | HR 74 | Temp 97.9°F | Resp 16 | Wt 130.9 lb

## 2023-04-13 DIAGNOSIS — D0511 Intraductal carcinoma in situ of right breast: Secondary | ICD-10-CM

## 2023-04-13 DIAGNOSIS — Z79899 Other long term (current) drug therapy: Secondary | ICD-10-CM | POA: Insufficient documentation

## 2023-04-13 DIAGNOSIS — M8580 Other specified disorders of bone density and structure, unspecified site: Secondary | ICD-10-CM | POA: Diagnosis not present

## 2023-04-13 DIAGNOSIS — Z7982 Long term (current) use of aspirin: Secondary | ICD-10-CM | POA: Insufficient documentation

## 2023-04-13 DIAGNOSIS — Z79811 Long term (current) use of aromatase inhibitors: Secondary | ICD-10-CM | POA: Insufficient documentation

## 2023-04-13 DIAGNOSIS — D6851 Activated protein C resistance: Secondary | ICD-10-CM | POA: Insufficient documentation

## 2023-04-13 LAB — CMP (CANCER CENTER ONLY)
ALT: 51 U/L — ABNORMAL HIGH (ref 0–44)
AST: 37 U/L (ref 15–41)
Albumin: 4.3 g/dL (ref 3.5–5.0)
Alkaline Phosphatase: 54 U/L (ref 38–126)
Anion gap: 4 — ABNORMAL LOW (ref 5–15)
BUN: 15 mg/dL (ref 6–20)
CO2: 33 mmol/L — ABNORMAL HIGH (ref 22–32)
Calcium: 9.3 mg/dL (ref 8.9–10.3)
Chloride: 103 mmol/L (ref 98–111)
Creatinine: 1.16 mg/dL — ABNORMAL HIGH (ref 0.44–1.00)
GFR, Estimated: 54 mL/min — ABNORMAL LOW (ref >60)
Glucose, Bld: 84 mg/dL (ref 70–99)
Potassium: 4.4 mmol/L (ref 3.5–5.1)
Sodium: 140 mmol/L (ref 135–145)
Total Bilirubin: 0.4 mg/dL (ref 0.3–1.2)
Total Protein: 6.8 g/dL (ref 6.5–8.1)

## 2023-04-13 LAB — CBC WITH DIFFERENTIAL (CANCER CENTER ONLY)
Abs Immature Granulocytes: 0.01 10*3/uL (ref 0.00–0.07)
Basophils Absolute: 0 10*3/uL (ref 0.0–0.1)
Basophils Relative: 1 %
Eosinophils Absolute: 0.2 10*3/uL (ref 0.0–0.5)
Eosinophils Relative: 4 %
HCT: 34.6 % — ABNORMAL LOW (ref 36.0–46.0)
Hemoglobin: 11.9 g/dL — ABNORMAL LOW (ref 12.0–15.0)
Immature Granulocytes: 0 %
Lymphocytes Relative: 31 %
Lymphs Abs: 1.3 10*3/uL (ref 0.7–4.0)
MCH: 32.8 pg (ref 26.0–34.0)
MCHC: 34.4 g/dL (ref 30.0–36.0)
MCV: 95.3 fL (ref 80.0–100.0)
Monocytes Absolute: 0.4 10*3/uL (ref 0.1–1.0)
Monocytes Relative: 8 %
Neutro Abs: 2.4 10*3/uL (ref 1.7–7.7)
Neutrophils Relative %: 56 %
Platelet Count: 192 10*3/uL (ref 150–400)
RBC: 3.63 MIL/uL — ABNORMAL LOW (ref 3.87–5.11)
RDW: 11.2 % — ABNORMAL LOW (ref 11.5–15.5)
WBC Count: 4.3 10*3/uL (ref 4.0–10.5)
nRBC: 0 % (ref 0.0–0.2)

## 2023-04-13 MED ORDER — ANASTROZOLE 1 MG PO TABS: 1.0000 mg | ORAL_TABLET | Freq: Every day | ORAL | 3 refills | Status: DC

## 2023-04-13 NOTE — Assessment & Plan Note (Addendum)
04/30/2018: Right lumpectomy: Extensive DCIS grade 2 ER/PR positive 06/11/2018-07/06/2018: Adjuvant radiation Current treatment: Anastrozole started 07/24/2018 x5 years History of heterozygous factor V Leiden mutation   Anastrozole toxicities: 1.  Mild joint stiffness 2. mild hot flashes   Osteopenia: Currently on bisphosphonate injections with Prolia.  She gets them with her primary care physician office.  I discussed with her that once she comes off anastrozole the Prolia can be discontinued if her bone density T-score is less than -2.  Her last bone density July 2022 T score -1.5.  She will discuss with Dr. Clelia Croft and make a decision   Patient lost 110 pounds on Mounjaro.  Breast cancer surveillance: 1.  Breast exam 04/13/2023: Benign 2. mammogram 04/10/2023: Benign breast density category B   She works as a Facilities manager at [employer] Return to clinic on an as-needed basis

## 2023-04-14 ENCOUNTER — Other Ambulatory Visit: Payer: Self-pay

## 2023-04-14 MED ORDER — ANASTROZOLE 1 MG PO TABS
1.0000 mg | ORAL_TABLET | Freq: Every day | ORAL | 6 refills | Status: AC
Start: 2023-04-14 — End: ?

## 2023-05-15 ENCOUNTER — Other Ambulatory Visit (HOSPITAL_COMMUNITY): Payer: Self-pay

## 2023-05-23 ENCOUNTER — Other Ambulatory Visit: Payer: Self-pay

## 2023-05-23 MED ORDER — GABAPENTIN 300 MG PO CAPS: 300.0000 mg | ORAL_CAPSULE | Freq: Every day | ORAL | 0 refills | Status: AC

## 2023-06-13 ENCOUNTER — Ambulatory Visit
Admission: RE | Admit: 2023-06-13 | Discharge: 2023-06-13 | Disposition: A | Discharge status: Home / Self Care | Source: Ambulatory Visit | Attending: Hematology and Oncology | Admitting: Hematology and Oncology

## 2023-06-13 DIAGNOSIS — D0511 Intraductal carcinoma in situ of right breast: Secondary | ICD-10-CM

## 2023-09-28 ENCOUNTER — Other Ambulatory Visit (HOSPITAL_COMMUNITY): Payer: Self-pay

## 2023-10-03 ENCOUNTER — Other Ambulatory Visit (HOSPITAL_COMMUNITY): Payer: Self-pay

## 2023-10-03 MED ORDER — MOUNJARO 5 MG/0.5ML ~~LOC~~ SOAJ
5.0000 mg | SUBCUTANEOUS | 6 refills | Status: AC
  Filled 2023-10-03: qty 2, 28d supply, fill #0
  Filled 2023-10-29: qty 2, 28d supply, fill #1
  Filled 2023-11-26: qty 2, 28d supply, fill #2
  Filled 2023-12-24: qty 2, 28d supply, fill #3
  Filled 2024-02-22: qty 2, 28d supply, fill #4
  Filled 2024-03-21: qty 2, 28d supply, fill #5

## 2023-10-06 ENCOUNTER — Other Ambulatory Visit (HOSPITAL_COMMUNITY): Payer: Self-pay

## 2023-11-10 ENCOUNTER — Other Ambulatory Visit: Payer: Self-pay | Admitting: Internal Medicine

## 2023-11-10 DIAGNOSIS — R1031 Right lower quadrant pain: Secondary | ICD-10-CM

## 2023-11-17 ENCOUNTER — Ambulatory Visit
Admission: RE | Admit: 2023-11-17 | Discharge: 2023-11-17 | Disposition: A | Discharge status: Home / Self Care | Payer: 59 | Source: Ambulatory Visit | Attending: Internal Medicine | Admitting: Internal Medicine

## 2023-11-17 DIAGNOSIS — R1031 Right lower quadrant pain: Secondary | ICD-10-CM

## 2023-11-17 MED ORDER — IOPAMIDOL (ISOVUE-300) INJECTION 61%
500.0000 mL | Freq: Once | INTRAVENOUS | Status: AC | PRN
  Administered 2023-11-17: 85 mL via INTRAVENOUS

## 2023-11-27 ENCOUNTER — Other Ambulatory Visit (HOSPITAL_COMMUNITY): Payer: Self-pay

## 2023-11-30 ENCOUNTER — Other Ambulatory Visit: Payer: Self-pay | Admitting: Internal Medicine

## 2023-11-30 DIAGNOSIS — K769 Liver disease, unspecified: Secondary | ICD-10-CM

## 2023-12-24 ENCOUNTER — Ambulatory Visit
Admission: RE | Admit: 2023-12-24 | Discharge: 2023-12-24 | Disposition: A | Discharge status: Home / Self Care | Payer: 59 | Source: Ambulatory Visit | Attending: Internal Medicine | Admitting: Internal Medicine

## 2023-12-24 DIAGNOSIS — K769 Liver disease, unspecified: Secondary | ICD-10-CM

## 2023-12-24 MED ORDER — GADOPICLENOL 0.5 MMOL/ML IV SOLN
6.0000 mL | Freq: Once | INTRAVENOUS | Status: AC | PRN
  Administered 2023-12-24: 6 mL via INTRAVENOUS

## 2024-01-03 ENCOUNTER — Other Ambulatory Visit: Payer: Self-pay | Admitting: Obstetrics and Gynecology

## 2024-01-03 DIAGNOSIS — Z1231 Encounter for screening mammogram for malignant neoplasm of breast: Secondary | ICD-10-CM

## 2024-04-03 ENCOUNTER — Other Ambulatory Visit (HOSPITAL_COMMUNITY): Payer: Self-pay

## 2024-04-03 MED ORDER — PROMETHAZINE HCL 12.5 MG PO TABS
12.5000 mg | ORAL_TABLET | Freq: Four times a day (QID) | ORAL | 1 refills | Status: AC | PRN
  Filled 2024-04-03: qty 30, 8d supply, fill #0

## 2024-04-03 MED ORDER — MOUNJARO 5 MG/0.5ML ~~LOC~~ SOAJ
5.0000 mg | SUBCUTANEOUS | 3 refills | Status: AC
  Filled 2024-04-03 – 2024-04-11 (×2): qty 6, 84d supply, fill #0
  Filled 2024-07-12: qty 6, 84d supply, fill #1
  Filled 2024-10-03: qty 6, 84d supply, fill #2

## 2024-04-08 ENCOUNTER — Other Ambulatory Visit (HOSPITAL_COMMUNITY): Payer: Self-pay

## 2024-04-11 ENCOUNTER — Other Ambulatory Visit (HOSPITAL_COMMUNITY): Payer: Self-pay

## 2024-04-12 ENCOUNTER — Ambulatory Visit
Admission: RE | Admit: 2024-04-12 | Discharge: 2024-04-12 | Disposition: A | Discharge status: Home / Self Care | Payer: 59 | Source: Ambulatory Visit | Attending: Obstetrics and Gynecology | Admitting: Obstetrics and Gynecology

## 2024-04-12 DIAGNOSIS — Z1231 Encounter for screening mammogram for malignant neoplasm of breast: Secondary | ICD-10-CM

## 2024-04-20 ENCOUNTER — Ambulatory Visit
Admission: EM | Admit: 2024-04-20 | Discharge: 2024-04-20 | Disposition: A | Discharge status: Home / Self Care | Attending: Emergency Medicine | Admitting: Emergency Medicine

## 2024-04-20 DIAGNOSIS — R1030 Lower abdominal pain, unspecified: Secondary | ICD-10-CM | POA: Diagnosis not present

## 2024-04-20 DIAGNOSIS — R300 Dysuria: Secondary | ICD-10-CM

## 2024-04-20 LAB — POCT URINALYSIS DIP (MANUAL ENTRY)
Bilirubin, UA: NEGATIVE
Blood, UA: NEGATIVE
Glucose, UA: NEGATIVE mg/dL
Ketones, POC UA: NEGATIVE mg/dL
Leukocytes, UA: NEGATIVE
Nitrite, UA: NEGATIVE
Protein Ur, POC: NEGATIVE mg/dL
Spec Grav, UA: 1.015 (ref 1.010–1.025)
Urobilinogen, UA: 0.2 U/dL
pH, UA: 6.5 (ref 5.0–8.0)

## 2024-04-20 NOTE — ED Provider Notes (Signed)
 Arlander Bellman    CSN: [id number] Arrival date & time: 04/20/24  1331      History   Chief Complaint Chief Complaint  Patient presents with   Urinary Frequency    HPI Ally Knodel is a 63 y.o. female.  Patient presents with 1 day history of lower abdominal cramping, urinary frequency, urinary urgency.  No fever, hematuria, vomiting, diarrhea, constipation, flank pain, vaginal discharge, pelvic pain.  No OTC medications taken today.  The history is provided by the patient and medical records.    Past Medical History:  Diagnosis Date   Anxiety    Breast cancer (HCC) 04/2018   right breast   Complication of anesthesia    Constipation    Depression    Dyspareunia 05/20/2014   Early menopause age 89   Ectatic aorta (HCC)    Fatigue    Fatty liver    GERD (gastroesophageal reflux disease)    Headache    Hemorrhoids    Heterozygous factor V Leiden mutation (HCC) 05/20/2014   Dx 2007  No personal hx of thrombosis;  mother DVT age 62 on OC   History of hiatal hernia    History of radiation therapy 06/11/18- 07/06/18   Right breast- 15 fractions for a total dose of 40.05 Gy, Right breast boost- 5 fractions for a total dose of 10 Gy   Hypertension    Hypothyroidism    Osteopenia    Personal history of radiation therapy 2019   Plantar fasciitis    PONV (postoperative nausea and vomiting)    Rosacea    Sleep apnea    Vitamin D  deficiency     Patient Active Problem List   Diagnosis Date Noted   GERD (gastroesophageal reflux disease)    Aortic atherosclerosis (HCC) 04/14/2020   Ductal carcinoma in situ (DCIS) of right breast 05/15/2018   Obstructive sleep apnea treated with continuous positive airway pressure (CPAP) 03/06/2018   Heterozygous factor V Leiden mutation (HCC) 05/20/2014   Dyspareunia 05/20/2014    Past Surgical History:  Procedure Laterality Date   BREAST BIOPSY Left 04/14/2022   x2   BREAST LUMPECTOMY Right 04/2018   CESAREAN SECTION      DIAGNOSTIC LAPAROSCOPY     for endometriosis   GANGLION CYST EXCISION Right    right wrist   INGUINAL HERNIA REPAIR     x 2 , at 18 months and 63 years old.    RADIOACTIVE SEED GUIDED EXCISIONAL BREAST BIOPSY Right 04/30/2018   Procedure: RIGHT RADIOACTIVE SEED GUIDED EXCISIONAL BREAST BIOPSY ERAS PATHWAY;  Surgeon: Enid Harry, MD;  Location: [facility] OR;  Service: General;  Laterality: Right;   SINUS EXPLORATION  1990    OB History     Gravida  1   Para  1   Term      Preterm      AB      Living         SAB      IAB      Ectopic      Multiple      Live Births               Home Medications    Prior to Admission medications   Medication Sig Start Date End Date Taking? Authorizing Provider  Acetaminophen  (TYLENOL  EXTRA STRENGTH PO) Take 1,000 mg by mouth daily as needed (headache/pain).     [provider]  anastrozole  (ARIMIDEX ) 1 MG tablet Take 1 tablet (1 mg  total) by mouth daily. 04/14/23   Gudena, Vinay, MD  aspirin EC 81 MG tablet Take 81 mg by mouth daily.    [provider]  Calcium Citrate (CITRACAL PO) Take 2 tablets by mouth daily.     [provider]  desvenlafaxine (PRISTIQ) 50 MG 24 hr tablet Take 50 mg by mouth daily.    [provider]  docusate sodium (COLACE) 100 MG capsule Take 100 mg by mouth as needed for mild constipation.    [provider]  FOLIC ACID PO Take 1,600 mcg by mouth daily.     [provider]  gabapentin  (NEURONTIN ) 300 MG capsule Take 1 capsule (300 mg total) by mouth at bedtime. May titrate up to 2 at bedtime in needed after 1 week. 05/23/23   Gudena, Vinay, MD  Hydrocortisone (PROCTOSOL HC RE) Place 1 application rectally 2 (two) times daily as needed (hemmorrhoids).    [provider]  Magnesium 250 MG TABS Take 250 mg by mouth at bedtime.    [provider]  Melatonin 3 MG CAPS Take 3 mg by mouth at bedtime.     [provider]   metroNIDAZOLE (METROGEL) 1 % gel Apply topically daily. Metronidazole 1% Ivermectin 1% Azelaic acid 15%    [provider]  nystatin-triamcinolone  ointment (MYCOLOG) Apply 1 application topically 2 (two) times daily as needed.    [provider]  pantoprazole  (PROTONIX ) 40 MG tablet Take 1 tablet (40 mg total) by mouth daily. 02/14/22   Gudena, Vinay, MD  Probiotic Product (ALIGN PO) Take 1 tablet by mouth daily.     [provider]  PROLIA  60 MG/ML SOSY injection Inject into the skin every 6 (six) months. 05/21/21   [provider]  promethazine  (PHENERGAN ) 12.5 MG tablet Take 1 tablet (12.5 mg total) by mouth every 6 (six) hours as needed for nausea 04/03/24     rosuvastatin (CRESTOR) 10 MG tablet Take 1 tablet (10 mg total) by mouth daily. 10/30/18   Magrinat, Rozella Cornfield, MD  SYNTHROID 75 MCG tablet Take 75 mcg by mouth every morning. 08/14/21   [provider]  tirzepatide  (MOUNJARO ) 5 MG/0.5ML Pen Inject 5 mg into the skin once a week. 10/03/23     tirzepatide  (MOUNJARO ) 5 MG/0.5ML Pen Inject 5 mg into the skin once a week. 04/03/24       Family History Family History  Problem Relation Age of Onset   CAD Mother    Deep vein thrombosis Mother    Hyperlipidemia Mother    COPD Mother    AAA (abdominal aortic aneurysm) Mother    Dementia Mother    High blood pressure Mother    Kidney disease Mother    COPD Father    Alcoholism Father    CVA Brother    Diabetes Brother    Hyperlipidemia Brother     Social History Social History   Tobacco Use   Smoking status: Never   Smokeless tobacco: Never  Vaping Use   Vaping status: Never Used  Substance Use Topics   Alcohol  use: Yes    Comment: occasional   Drug use: No     Allergies   Cat dander and Codeine   Review of Systems Review of Systems  Constitutional:  Negative for chills and fever.  Gastrointestinal:  Positive for abdominal pain. Negative for blood in stool, constipation,  diarrhea, nausea and vomiting.  Genitourinary:  Positive for dysuria, frequency and urgency. Negative for flank pain, hematuria, pelvic  pain and vaginal discharge.     Physical Exam Triage Vital Signs ED Triage Vitals  Encounter Vitals Group     BP      Systolic BP Percentile      Diastolic BP Percentile      Pulse      Resp      Temp      Temp src      SpO2      Weight      Height      Head Circumference      Peak Flow      Pain Score      Pain Loc      Pain Education      Exclude from Growth Chart    No data found.  Updated Vital Signs BP 117/76   Pulse 96   Temp 98 F (36.7 C)   Resp 18   SpO2 98%   Visual Acuity Right Eye Distance:   Left Eye Distance:   Bilateral Distance:    Right Eye Near:   Left Eye Near:    Bilateral Near:     Physical Exam Constitutional:      General: She is not in acute distress. HENT:     Mouth/Throat:     Mouth: Mucous membranes are moist.  Cardiovascular:     Rate and Rhythm: Normal rate and regular rhythm.     Heart sounds: Normal heart sounds.  Pulmonary:     Effort: Pulmonary effort is normal. No respiratory distress.     Breath sounds: Normal breath sounds.  Abdominal:     General: Bowel sounds are normal.     Palpations: Abdomen is soft.     Tenderness: There is no abdominal tenderness. There is no right CVA tenderness, left CVA tenderness, guarding or rebound.  Neurological:     Mental Status: She is alert.      UC Treatments / Results  Labs (all labs ordered are listed, but only abnormal results are displayed) Labs Reviewed  POCT URINALYSIS DIP (MANUAL ENTRY) - Abnormal; Notable for the following components:      Result Value   Clarity, UA cloudy (*)    All other components within normal limits    EKG   Radiology No results found.  Procedures Procedures (including critical care time)  Medications Ordered in UC Medications - No data to display  Initial Impression / Assessment and Plan / UC  Course  I have reviewed the triage vital signs and the nursing notes.  Pertinent labs & imaging results that were available during my care of the patient were reviewed by me and considered in my medical decision making (see chart for details).   Dysuria, lower abdominal pain.  Afebrile and vital signs are stable.  Abdomen is soft and nontender with good bowel sounds.  Urine negative for infection.  Instructed patient to follow-up with her PCP on Monday.  ED precautions given.  Education provided on abdominal pain and dysuria.  Patient agrees to plan of care.   Final Clinical Impressions(s) / UC Diagnoses   Final diagnoses:  Dysuria  Lower abdominal pain     Discharge Instructions      There is no indication of a urinary tract infection at this time.  Follow-up with your primary care provider on Monday.  Go to the emergency department if you have worsening symptoms.   ED Prescriptions   None    PDMP not reviewed this encounter.   Leanor Proper  H, NP 04/20/24 1433

## 2024-04-20 NOTE — Discharge Instructions (Addendum)
 There is no indication of a urinary tract infection at this time.  Follow-up with your primary care provider on Monday.  Go to the emergency department if you have worsening symptoms.

## 2024-04-20 NOTE — ED Triage Notes (Signed)
 Patient to Urgent Care with complaints of urinary urgency/ frequency/ lower abdominal cramping. Denies any fevers.   Symptoms started yesterday afternoon.

## 2024-07-12 ENCOUNTER — Other Ambulatory Visit: Payer: Self-pay

## 2024-10-22 ENCOUNTER — Other Ambulatory Visit (HOSPITAL_COMMUNITY): Payer: Self-pay | Admitting: Internal Medicine

## 2024-10-22 ENCOUNTER — Telehealth (HOSPITAL_COMMUNITY): Payer: Self-pay | Admitting: Pharmacy Technician

## 2024-10-22 DIAGNOSIS — M8580 Other specified disorders of bone density and structure, unspecified site: Secondary | ICD-10-CM | POA: Insufficient documentation

## 2024-10-22 NOTE — Telephone Encounter (Signed)
 Auth Submission: NO AUTH NEEDED Site of care: [facility] Payer: [employer], [employer] Medication & CPT/J Code(s) submitted: Prolia  (Denosumab ) [id number] Diagnosis Code: M81.0 Route of submission (phone, fax, portal):  Phone # Fax # Auth type: Buy/Bill HB Units/visits requested: 60mg  x 2 doses, q 6 months Reference number: [id number] Approval from: 10/25/2024 to 03/21/25    Dagoberto Armour, CPhT [facility] Phone: [phone] 10/25/2024

## 2024-10-25 ENCOUNTER — Encounter (HOSPITAL_COMMUNITY): Payer: Self-pay | Admitting: Internal Medicine

## 2024-11-04 ENCOUNTER — Inpatient Hospital Stay (HOSPITAL_COMMUNITY): Admission: RE | Admit: 2024-11-04 | Discharge: 2024-11-04 | Attending: Internal Medicine

## 2024-11-04 VITALS — BP 104/70 | HR 76 | Temp 97.2°F | Resp 16

## 2024-11-04 DIAGNOSIS — M8580 Other specified disorders of bone density and structure, unspecified site: Secondary | ICD-10-CM | POA: Diagnosis present

## 2024-11-04 MED ORDER — DENOSUMAB 60 MG/ML ~~LOC~~ SOSY
60.0000 mg | PREFILLED_SYRINGE | Freq: Once | SUBCUTANEOUS | Status: AC
  Administered 2024-11-04: 12:00:00 60 mg via SUBCUTANEOUS

## 2024-11-04 MED ORDER — DENOSUMAB 60 MG/ML ~~LOC~~ SOSY
PREFILLED_SYRINGE | SUBCUTANEOUS | Status: AC
  Filled 2024-11-04: qty 1

## 2024-11-29 ENCOUNTER — Other Ambulatory Visit: Payer: Self-pay | Admitting: Internal Medicine

## 2024-11-29 DIAGNOSIS — K869 Disease of pancreas, unspecified: Secondary | ICD-10-CM

## 2024-12-04 ENCOUNTER — Encounter (HOSPITAL_COMMUNITY): Payer: Self-pay | Admitting: Internal Medicine

## 2024-12-04 ENCOUNTER — Other Ambulatory Visit (HOSPITAL_COMMUNITY): Payer: Self-pay

## 2024-12-04 MED ORDER — MOUNJARO 5 MG/0.5ML ~~LOC~~ SOAJ
5.0000 mg | SUBCUTANEOUS | 3 refills | Status: AC
  Filled 2024-12-04 (×2): qty 6, 84d supply, fill #0
  Filled 2024-12-05: qty 2, 28d supply, fill #0

## 2024-12-05 ENCOUNTER — Other Ambulatory Visit (HOSPITAL_COMMUNITY): Payer: Self-pay

## 2024-12-05 ENCOUNTER — Other Ambulatory Visit: Payer: Self-pay

## 2024-12-10 ENCOUNTER — Other Ambulatory Visit (HOSPITAL_COMMUNITY): Payer: Self-pay

## 2024-12-11 ENCOUNTER — Other Ambulatory Visit: Payer: Self-pay | Admitting: Obstetrics & Gynecology

## 2024-12-11 DIAGNOSIS — Z1231 Encounter for screening mammogram for malignant neoplasm of breast: Secondary | ICD-10-CM

## 2024-12-28 ENCOUNTER — Other Ambulatory Visit

## 2025-04-16 ENCOUNTER — Ambulatory Visit

## 2025-05-06 ENCOUNTER — Encounter (HOSPITAL_COMMUNITY)
